# Patient Record
Sex: Male | Born: 1935 | Race: White | Hispanic: No | Marital: Married | Smoking: Former smoker
Health system: Southern US, Community
[De-identification: ages and names within clinical notes are randomized; demographics above are authoritative.]

## PROBLEM LIST (undated history)

## (undated) DIAGNOSIS — I10 Essential (primary) hypertension: Secondary | ICD-10-CM

## (undated) DIAGNOSIS — I480 Paroxysmal atrial fibrillation: Secondary | ICD-10-CM

## (undated) DIAGNOSIS — J449 Chronic obstructive pulmonary disease, unspecified: Secondary | ICD-10-CM

## (undated) DIAGNOSIS — M1990 Unspecified osteoarthritis, unspecified site: Secondary | ICD-10-CM

## (undated) DIAGNOSIS — N400 Enlarged prostate without lower urinary tract symptoms: Secondary | ICD-10-CM

## (undated) HISTORY — DX: Essential (primary) hypertension: I10

## (undated) HISTORY — DX: Unspecified osteoarthritis, unspecified site: M19.90

## (undated) HISTORY — DX: Benign prostatic hyperplasia without lower urinary tract symptoms: N40.0

## (undated) HISTORY — DX: Paroxysmal atrial fibrillation: I48.0

## (undated) HISTORY — DX: Chronic obstructive pulmonary disease, unspecified: J44.9

---

## 1999-06-14 ENCOUNTER — Encounter: Payer: Self-pay | Admitting: Family Medicine

## 1999-06-14 ENCOUNTER — Encounter: Admission: RE | Admit: 1999-06-14 | Discharge: 1999-06-14 | Payer: Self-pay | Admitting: Family Medicine

## 1999-10-21 ENCOUNTER — Encounter: Admission: RE | Admit: 1999-10-21 | Discharge: 1999-10-21 | Payer: Self-pay | Admitting: Family Medicine

## 1999-10-21 ENCOUNTER — Encounter: Payer: Self-pay | Admitting: Family Medicine

## 2006-02-16 ENCOUNTER — Encounter: Admission: RE | Admit: 2006-02-16 | Discharge: 2006-02-16 | Payer: Self-pay | Admitting: Vascular Surgery

## 2010-02-10 HISTORY — PX: CARPAL TUNNEL RELEASE: SHX101

## 2010-03-06 LAB — SURGICAL PCR SCREEN
MRSA, PCR: NEGATIVE
Staphylococcus aureus: NEGATIVE

## 2010-03-06 LAB — CBC
HCT: 37.7 % — ABNORMAL LOW (ref 39.0–52.0)
Hemoglobin: 13.1 g/dL (ref 13.0–17.0)
MCH: 29.8 pg (ref 26.0–34.0)
MCHC: 34.7 g/dL (ref 30.0–36.0)
MCV: 85.9 fL (ref 78.0–100.0)
Platelets: 247 10*3/uL (ref 150–400)
RBC: 4.39 MIL/uL (ref 4.22–5.81)
RDW: 12.1 % (ref 11.5–15.5)
WBC: 7.4 10*3/uL (ref 4.0–10.5)

## 2010-03-06 LAB — BASIC METABOLIC PANEL
BUN: 14 mg/dL (ref 6–23)
CO2: 28 mEq/L (ref 19–32)
Calcium: 9.3 mg/dL (ref 8.4–10.5)
Chloride: 104 mEq/L (ref 96–112)
Creatinine, Ser: 1.65 mg/dL — ABNORMAL HIGH (ref 0.4–1.5)
GFR calc Af Amer: 49 mL/min — ABNORMAL LOW (ref >60)
GFR calc non Af Amer: 40 mL/min — ABNORMAL LOW (ref >60)
Glucose, Bld: 115 mg/dL — ABNORMAL HIGH (ref 70–99)
Potassium: 3.8 mEq/L (ref 3.5–5.1)
Sodium: 140 mEq/L (ref 135–145)

## 2010-03-08 ENCOUNTER — Ambulatory Visit (HOSPITAL_COMMUNITY)
Admission: RE | Admit: 2010-03-08 | Discharge: 2010-03-08 | Payer: Self-pay | Source: Home / Self Care | Attending: Neurosurgery | Admitting: Neurosurgery

## 2010-04-05 NOTE — Op Note (Signed)
  NAME:  Vincent Barrett, Vincent Barrett NO.:  [id number]  MEDICAL RECORD NO.:  [id number]          PATIENT TYPE:  OIB  LOCATION:  3536                         FACILITY:  [facility]  PHYSICIAN:  Coletta Memos, M.D.     DATE OF BIRTH:  [birth year]  DATE OF PROCEDURE:  03/08/2010 DATE OF DISCHARGE:  03/08/2010                              OPERATIVE REPORT   PREOPERATIVE DIAGNOSIS:  Left ulnar neuropathy.  POSTOPERATIVE DIAGNOSIS:  Left ulnar neuropathy.  PROCEDURE:  Left ulnar nerve decompression, cubital tunnel.  COMPLICATIONS:  None.  ANESTHESIA:  General endotracheal.  SURGEON:  Coletta Memos, MD  INDICATIONS:  Mr. Brensinger is an 77 year old with 74-month history of a fairly rapid weakness in his left hand.  EMG nerve conduction studies revealed left ulnar neuropathy.  I offered and recommended and he agreed to undergo operative decompression.  OPERATIVE NOTE:  Mr. Wesche was brought to the operating room, intubated, and placed under general anesthetic without difficulty.  His left upper extremity was prepped in a sterile fashion.  I made a semicircle incision centering over the medial epicondyle on the left elbow.  I then developed a flap, brought that down, and was able to identify the cubital tunnel.  I then dissected using both sharp and blunt technique until I exposed the ulnar nerve.  I was able to stimulate it actually quite well at the elbow and distal, but I was unable to get much stimulation proximal to it.  I took great care to make sure that there was no compression, obvious root injury, or any other type of problem proximally, and I was not able to identify one.  I then satisfied myself with the decompression, had been split flexor carpi ulnaris in its most proximal aspect to ensure decompression distally.  I irrigated the wound.  I closed the wound in layered fashion using Vicryl sutures to reapproximate the incision.  I used Dermabond for sterile dressing.      ______________________________ Coletta Memos, M.D.     KC/MEDQ  D:  03/08/2010  T:  03/09/2010  Job:  [id number]  Electronically Signed by Coletta Memos M.D. on 04/05/2010 09:23:49 AM

## 2010-04-05 NOTE — Discharge Summary (Signed)
  NAMEJERIMYAH, Vincent Barrett NO.:  [id number]  MEDICAL RECORD NO.:  [id number]          PATIENT TYPE:  OIB  LOCATION:  3536                         FACILITY:  [facility]  PHYSICIAN:  Coletta Memos, M.D.     DATE OF BIRTH:  April 03, 1925  DATE OF ADMISSION:  03/08/2010 DATE OF DISCHARGE:  03/08/2010                              DISCHARGE SUMMARY   ADMITTING DIAGNOSIS:  Left ulnar neuropathy.  POSTOPERATIVE DISCHARGE DIAGNOSIS:  Left ulnar neuropathy.  PROCEDURE:  Left ulnar nerve decompression at the cubital tunnel.  COMPLICATIONS:  None.  DISCHARGE STATUS:  Alive and well.  MEDICATIONS:  Tylenol #3.  INDICATIONS:  Ms. Ziemann is an 77 year old who has had significant weakness in his left hand over approximately last 2 months.  EMG showed significant ulnar nerve compromise.  He had cervical spondylosis, but that was not the cause.  I therefore recommended, he agreed to undergo operative decompression.  He was admitted and then discharged from the same-day operation of the ulnar nerve.  The wound was clean, dry, and no signs of infection at discharge.  His hand was working well at discharge.  I will see him in the office in 2-3 weeks for followup.          ______________________________ Coletta Memos, M.D.     KC/MEDQ  D:  03/08/2010  T:  03/09/2010  Job:  [id number]  Electronically Signed by Coletta Memos M.D. on 04/05/2010 09:23:45 AM

## 2011-02-11 HISTORY — PX: TRANSTHORACIC ECHOCARDIOGRAM: SHX275

## 2011-08-08 HISTORY — PX: NM MYOCAR PERF WALL MOTION: HXRAD629

## 2012-03-17 ENCOUNTER — Other Ambulatory Visit (HOSPITAL_COMMUNITY): Payer: Self-pay | Admitting: Cardiovascular Disease

## 2012-03-17 DIAGNOSIS — I729 Aneurysm of unspecified site: Secondary | ICD-10-CM

## 2012-03-31 ENCOUNTER — Ambulatory Visit (HOSPITAL_COMMUNITY): Payer: Self-pay

## 2012-04-07 ENCOUNTER — Ambulatory Visit (HOSPITAL_COMMUNITY)
Admission: RE | Admit: 2012-04-07 | Discharge: 2012-04-07 | Disposition: A | Discharge status: Home / Self Care | Payer: Medicare Other | Source: Ambulatory Visit | Attending: Cardiovascular Disease | Admitting: Cardiovascular Disease

## 2012-04-07 DIAGNOSIS — I729 Aneurysm of unspecified site: Secondary | ICD-10-CM

## 2012-04-07 NOTE — Progress Notes (Signed)
Aorta Duplex Completed. Brysten Reister D  

## 2012-09-13 ENCOUNTER — Other Ambulatory Visit: Payer: Self-pay | Admitting: Cardiovascular Disease

## 2012-09-13 ENCOUNTER — Other Ambulatory Visit: Payer: Self-pay | Admitting: *Deleted

## 2012-09-13 DIAGNOSIS — I10 Essential (primary) hypertension: Secondary | ICD-10-CM

## 2012-09-13 LAB — CBC WITH DIFFERENTIAL/PLATELET
Basophils Absolute: 0.1 10*3/uL (ref 0.0–0.1)
Eosinophils Relative: 5 % (ref 0–5)
Lymphocytes Relative: 35 % (ref 12–46)
MCV: 86.5 fL (ref 78.0–100.0)
Neutro Abs: 3.5 10*3/uL (ref 1.7–7.7)
Neutrophils Relative %: 48 % (ref 43–77)
Platelets: 238 10*3/uL (ref 150–400)
RBC: 4.38 MIL/uL (ref 4.22–5.81)
RDW: 13.3 % (ref 11.5–15.5)
WBC: 7.1 10*3/uL (ref 4.0–10.5)

## 2012-09-13 LAB — MAGNESIUM: Magnesium: 1.9 mg/dL (ref 1.5–2.5)

## 2012-09-13 MED ORDER — AMLODIPINE BESYLATE 2.5 MG PO TABS: 2.5000 mg | ORAL_TABLET | Freq: Every day | ORAL | Status: DC

## 2012-12-13 ENCOUNTER — Telehealth: Payer: Self-pay | Admitting: Cardiovascular Disease

## 2012-12-13 NOTE — Telephone Encounter (Signed)
Now taking Bystolic 5 mg tablets  After first of year insurance will not cover.  They suggested Atenolol orl metoprolol .  Please call so she can get straight before Jan 1.  Also wants to know when his next appt would be.

## 2012-12-13 NOTE — Telephone Encounter (Signed)
Returned call and pt verified x 2 w/ Alona Bene, pt's wife.  Stated Medicare sent letter that they will not cover his Bystolic at the beginning of the year and want him to be switched to another med.  Informed pt will need to see new cardiologist to make the change.  Wife did not want to schedule appt now.  Stated refill pt gets this week will last through January.  Informed scheduling will be notified to enter a recall for pt appt in January w/ new cardiologist.  Wife verbalized understanding and agreed w/ plan.  Will call back by Dec. 12th if no call from scheduler.  Scheduling notified and recall entered per [facility].

## 2013-02-02 ENCOUNTER — Encounter: Payer: Self-pay | Admitting: *Deleted

## 2013-02-07 ENCOUNTER — Encounter: Payer: Self-pay | Admitting: Internal Medicine

## 2013-02-15 ENCOUNTER — Encounter: Payer: Self-pay | Admitting: Internal Medicine

## 2013-02-15 ENCOUNTER — Ambulatory Visit (INDEPENDENT_AMBULATORY_CARE_PROVIDER_SITE_OTHER): Payer: Medicare Other | Admitting: Internal Medicine

## 2013-02-15 VITALS — BP 176/70 | HR 61 | Ht 65.0 in | Wt 170.5 lb

## 2013-02-15 DIAGNOSIS — I10 Essential (primary) hypertension: Secondary | ICD-10-CM

## 2013-02-15 DIAGNOSIS — I480 Paroxysmal atrial fibrillation: Secondary | ICD-10-CM

## 2013-02-15 DIAGNOSIS — I4891 Unspecified atrial fibrillation: Secondary | ICD-10-CM

## 2013-02-15 DIAGNOSIS — J449 Chronic obstructive pulmonary disease, unspecified: Secondary | ICD-10-CM

## 2013-02-15 MED ORDER — METOPROLOL SUCCINATE ER 25 MG PO TB24: 25.0000 mg | ORAL_TABLET | Freq: Every day | ORAL | Status: DC

## 2013-02-15 NOTE — Patient Instructions (Signed)
Your physician wants you to follow-up in: 1 year with Dr. Rennis GoldenHilty.  You will receive a reminder letter in the mail two months in advance. If you don't receive a letter, please call our office to schedule the follow-up appointment.  STOP Bystolic START Toprol XL 25mg  once daily.

## 2013-02-17 ENCOUNTER — Encounter: Payer: Self-pay | Admitting: Internal Medicine

## 2013-02-17 DIAGNOSIS — I4891 Unspecified atrial fibrillation: Secondary | ICD-10-CM | POA: Insufficient documentation

## 2013-02-17 DIAGNOSIS — I10 Essential (primary) hypertension: Secondary | ICD-10-CM | POA: Insufficient documentation

## 2013-02-17 DIAGNOSIS — J449 Chronic obstructive pulmonary disease, unspecified: Secondary | ICD-10-CM | POA: Insufficient documentation

## 2013-02-17 NOTE — Progress Notes (Signed)
OFFICE NOTE  Chief Complaint:  Establish new cardiologist  Primary Care Physician: Pamelia Hoit, MD  HPI:  Vincent Barrett is a pleasant 80 year old male previously followed by Dr. Alanda Amass who is here today to establish cardiac care with me. His past medical history is significant for an episode of paroxysmal atrial fibrillation in June of 2013. He converted spontaneously back to sinus rhythm and seems to maintain that. Based on the relative infrequency of his episodes, he has been maintained on aspirin. He has as history of hypertension which is fairly well-controlled although he does have an element of whitecoat hypertension. He was a former smoker but quit over 50 years ago. He had a negative Myoview for ischemia in June of 2013.  He denies any chest pain or worsening shortness of breath.  PMHx:  Past Medical History  Diagnosis Date  . PAF (paroxysmal atrial fibrillation)   . Hypertension   . COPD (chronic obstructive pulmonary disease)   . BPH (benign prostatic hyperplasia)   . OA (osteoarthritis)     Past Surgical History  Procedure Laterality Date  . Carpal tunnel release  02/2010    Dr. Franky Macho  . Transthoracic echocardiogram  2013    EF 52%, mild AV leaflet thickening; trace TR  . Nm myocar perf wall motion  08/08/2011    lexiscan; normal pattern of perfusion in all regions; post-stress EF 69%; low risk scan     FAMHx:  No family history on file.  SOCHx:   reports that he quit smoking about 50 years ago. He has never used smokeless tobacco. He reports that he does not drink alcohol or use illicit drugs.  ALLERGIES:  No Known Allergies  ROS: A comprehensive review of systems was negative.  HOME MEDS: Current Outpatient Prescriptions  Medication Sig Dispense Refill  . amLODipine (NORVASC) 2.5 MG tablet Take 1 tablet (2.5 mg total) by mouth daily.      Marland Kitchen aspirin 325 MG tablet Take 325 mg by mouth every other day.       . doxazosin (CARDURA) 4 MG tablet Take  4 mg by mouth daily.      . GuaiFENesin (MUCINEX PO) Take by mouth as needed.      Marland Kitchen losartan-hydrochlorothiazide (HYZAAR) 100-25 MG per tablet Take 1 tablet by mouth daily.      . Omega-3 Fatty Acids (FISH OIL) 1000 MG CAPS Take 1 capsule by mouth daily.      . pantoprazole (PROTONIX) 40 MG tablet Take 40 mg by mouth daily.      . vitamin B-12 (CYANOCOBALAMIN) 1000 MCG tablet Take 1,000 mcg by mouth daily.      . vitamin C (ASCORBIC ACID) 500 MG tablet Take 1,000 mg by mouth daily.       . vitamin E 400 UNIT capsule Take 400 Units by mouth daily.      . metoprolol succinate (TOPROL XL) 25 MG 24 hr tablet Take 1 tablet (25 mg total) by mouth daily.  90 tablet  3   No current facility-administered medications for this visit.    LABS/IMAGING: No results found for this or any previous visit (from the past 48 hour(s)). No results found.  VITALS: BP 176/70  Pulse 61  Ht 5\' 5"  (1.651 m)  Wt 170 lb 8 oz (77.338 kg)  BMI 28.37 kg/m2  EXAM: General appearance: alert and no distress Neck: no carotid bruit and no JVD Lungs: clear to auscultation bilaterally Heart: regular rate and rhythm, S1, S2 normal,  no murmur, click, rub or gallop Abdomen: soft, non-tender; bowel sounds normal; no masses,  no organomegaly Extremities: extremities normal, atraumatic, no cyanosis or edema Pulses: 2+ and symmetric Skin: Skin color, texture, turgor normal. No rashes or lesions Neurologic: Grossly normal Psych: Mood, affect normal  EKG: Sinus rhythm with first degree AV block at 61, PR interval 294 ms  ASSESSMENT: 1. Hypertension-controlled 2. One episode of possible atrial fibrillation, without recurrence on aspirin  PLAN: 1.   Vincent Barrett is doing well without recurrence of palpitations or other tachyarrhythmias. I did note an EKG from 06/25/2011 which was interpreted as atrial fibrillation, however it does appear that this may be sinus with frequent PACs.  Nevertheless there has been no recurrence  and I will recommend continuing on aspirin unless there is more evidence for true atrial fibrillation. His blood pressure is well controlled. Plan to see him back in one year.  Chrystie NoseKenneth C. Hilty, MD, [facility] Center[facility] Attending Cardiologist [facility]  HILTY,Kenneth C 02/17/2013, 1:21 PM

## 2014-02-07 ENCOUNTER — Other Ambulatory Visit: Payer: Self-pay | Admitting: Internal Medicine

## 2014-02-08 NOTE — Telephone Encounter (Signed)
Rx(s) sent to pharmacy electronically. OV 02/15/14

## 2014-02-15 ENCOUNTER — Ambulatory Visit (INDEPENDENT_AMBULATORY_CARE_PROVIDER_SITE_OTHER): Payer: Medicare Other | Admitting: Internal Medicine

## 2014-02-15 ENCOUNTER — Encounter: Payer: Self-pay | Admitting: Internal Medicine

## 2014-02-15 VITALS — BP 142/68 | HR 74 | Ht 65.0 in | Wt 167.1 lb

## 2014-02-15 DIAGNOSIS — I480 Paroxysmal atrial fibrillation: Secondary | ICD-10-CM

## 2014-02-15 DIAGNOSIS — I10 Essential (primary) hypertension: Secondary | ICD-10-CM

## 2014-02-15 DIAGNOSIS — J438 Other emphysema: Secondary | ICD-10-CM

## 2014-02-15 MED ORDER — METOPROLOL SUCCINATE ER 25 MG PO TB24: 25.0000 mg | ORAL_TABLET | Freq: Every day | ORAL | Status: DC

## 2014-02-15 NOTE — Patient Instructions (Signed)
Your physician has recommended you make the following change in your medication: TAKE ASPIRIN 81mg  EVERY DAY  Your physician wants you to follow-up in: 1 year with Dr. Rennis GoldenHilty. You will receive a reminder letter in the mail two months in advance. If you don't receive a letter, please call our office to schedule the follow-up appointment.

## 2014-02-15 NOTE — Progress Notes (Signed)
OFFICE NOTE  Chief Complaint:  Routine follow-up  Primary Care Physician: Pamelia HoitWILSON,FRED Theoplis, MD  HPI:  Rodman KeyHenry Shadwick is a pleasant 81 year old male previously followed by Dr. Alanda AmassWeintraub who is here today to establish cardiac care with me. His past medical history is significant for an episode of paroxysmal atrial fibrillation in June of 2013. He converted spontaneously back to sinus rhythm and seems to maintain that. Based on the relative infrequency of his episodes, he has been maintained on aspirin. He has as history of hypertension which is fairly well-controlled although he does have an element of whitecoat hypertension. He was a former smoker but quit over 50 years ago. He had a negative Myoview for ischemia in June of 2013.  He denies any chest pain or worsening shortness of breath.  [name] returns today for follow-up. He denies any chest pain or worsening shortness of breath. He continues to have lower leg swelling. This may be related to the Norvasc although recent decreases in his Norvasc has not improved the swelling. He is on low-dose Hyzaar. He reports no recurrent atrial fibrillation. Is not clear whether he actually had A. fib in the past or perhaps very frequent PACs. Either way the burden of his A. fib is very low and therefore aspirin therapy is reasonable.  PMHx:  Past Medical History  Diagnosis Date  . PAF (paroxysmal atrial fibrillation)   . Hypertension   . COPD (chronic obstructive pulmonary disease)   . BPH (benign prostatic hyperplasia)   . OA (osteoarthritis)     Past Surgical History  Procedure Laterality Date  . Carpal tunnel release  02/2010    Dr. Franky Machoabbell  . Transthoracic echocardiogram  2013    EF 52%, mild AV leaflet thickening; trace TR  . Nm myocar perf wall motion  08/08/2011    lexiscan; normal pattern of perfusion in all regions; post-stress EF 69%; low risk scan     FAMHx:  History reviewed. No pertinent family history.  SOCHx:   reports  that he quit smoking about 51 years ago. He has never used smokeless tobacco. He reports that he does not drink alcohol or use illicit drugs.  ALLERGIES:  No Known Allergies  ROS: A comprehensive review of systems was negative except for: Cardiovascular: positive for lower extremity edema  HOME MEDS: Current Outpatient Prescriptions  Medication Sig Dispense Refill  . amLODipine (NORVASC) 2.5 MG tablet Take 1 tablet (2.5 mg total) by mouth daily.    Marland Kitchen. aspirin EC 81 MG tablet Take 81 mg by mouth daily.    Marland Kitchen. doxazosin (CARDURA) 4 MG tablet Take 4 mg by mouth daily.    . GuaiFENesin (MUCINEX PO) Take by mouth as needed.    Marland Kitchen. losartan-hydrochlorothiazide (HYZAAR) 100-25 MG per tablet Take 1 tablet by mouth daily.    . metoprolol succinate (TOPROL-XL) 25 MG 24 hr tablet Take 1 tablet (25 mg total) by mouth daily. 90 tablet 3  . Omega-3 Fatty Acids (FISH OIL) 1000 MG CAPS Take 1 capsule by mouth daily.    . vitamin B-12 (CYANOCOBALAMIN) 1000 MCG tablet Take 1,000 mcg by mouth daily.    . vitamin C (ASCORBIC ACID) 500 MG tablet Take 1,000 mg by mouth daily.     . vitamin E 400 UNIT capsule Take 400 Units by mouth daily.     No current facility-administered medications for this visit.    LABS/IMAGING: No results found for this or any previous visit (from the past 48 hour(s)). No  results found.  VITALS: BP 142/68 mmHg  Pulse 74  Ht  (1.651 m)  Wt 167 lb 1.6 oz (75.796 kg)  BMI 27.81 kg/m2  EXAM: General appearance: alert and no distress Neck: no carotid bruit and no JVD Lungs: clear to auscultation bilaterally Heart: regular rate and rhythm, S1, S2 normal, no murmur, click, rub or gallop Abdomen: soft, non-tender; bowel sounds normal; no masses,  no organomegaly Extremities: edema 2+ RLE and 1+ LLE Pulses: 2+ and symmetric Skin: Skin color, texture, turgor normal. No rashes or lesions Neurologic: Grossly normal Psych: Mood, affect normal  EKG: Sinus rhythm with first  degree AV block and PACs  ASSESSMENT: 1. Hypertension-controlled 2. One episode of possible atrial fibrillation, without recurrence on aspirin 3. Leg edema  PLAN: 1.   Mr. Moya is doing well without recurrence of palpitations or other tachyarrhythmias. I did note an EKG from 06/25/2011 which was interpreted as atrial fibrillation, however it does appear that this may be sinus with frequent PACs.  Nevertheless there has been no recurrence and I will recommend continuing on aspirin unless there is more evidence for true atrial fibrillation. He did tell me today that he takes a full dose aspirin very irregularly. I've encouraged him to start taking low-dose aspirin on a daily basis. His blood pressure is well controlled. Of encouraged him to continue to wear compression stockings, elevate his legs as they may help with the edema. Plan to see him back in 6 months.  Chrystie Nose, MD, [facility] Attending Cardiologist [facility]  Jaely Silman C 02/15/2014, 5:32 PM

## 2014-03-29 ENCOUNTER — Encounter: Payer: Self-pay | Admitting: Internal Medicine

## 2014-12-04 ENCOUNTER — Telehealth: Payer: Self-pay | Admitting: Internal Medicine

## 2014-12-05 NOTE — Telephone Encounter (Signed)
Close encounter 

## 2015-02-17 ENCOUNTER — Emergency Department (HOSPITAL_COMMUNITY)
Admission: EM | Admit: 2015-02-17 | Discharge: 2015-02-17 | Disposition: A | Discharge status: Home / Self Care | Payer: Medicare Other | Attending: Emergency Medicine | Admitting: Emergency Medicine

## 2015-02-17 ENCOUNTER — Emergency Department (HOSPITAL_COMMUNITY): Payer: Medicare Other

## 2015-02-17 ENCOUNTER — Encounter (HOSPITAL_COMMUNITY): Payer: Self-pay

## 2015-02-17 DIAGNOSIS — S1081XA Abrasion of other specified part of neck, initial encounter: Secondary | ICD-10-CM

## 2015-02-17 DIAGNOSIS — Y92014 Private driveway to single-family (private) house as the place of occurrence of the external cause: Secondary | ICD-10-CM | POA: Diagnosis not present

## 2015-02-17 DIAGNOSIS — L089 Local infection of the skin and subcutaneous tissue, unspecified: Secondary | ICD-10-CM

## 2015-02-17 DIAGNOSIS — S4992XA Unspecified injury of left shoulder and upper arm, initial encounter: Secondary | ICD-10-CM | POA: Diagnosis not present

## 2015-02-17 DIAGNOSIS — S0990XA Unspecified injury of head, initial encounter: Secondary | ICD-10-CM | POA: Insufficient documentation

## 2015-02-17 DIAGNOSIS — S0081XA Abrasion of other part of head, initial encounter: Secondary | ICD-10-CM | POA: Insufficient documentation

## 2015-02-17 DIAGNOSIS — Z79899 Other long term (current) drug therapy: Secondary | ICD-10-CM | POA: Diagnosis not present

## 2015-02-17 DIAGNOSIS — I10 Essential (primary) hypertension: Secondary | ICD-10-CM | POA: Diagnosis not present

## 2015-02-17 DIAGNOSIS — S2232XA Fracture of one rib, left side, initial encounter for closed fracture: Secondary | ICD-10-CM | POA: Insufficient documentation

## 2015-02-17 DIAGNOSIS — I480 Paroxysmal atrial fibrillation: Secondary | ICD-10-CM | POA: Insufficient documentation

## 2015-02-17 DIAGNOSIS — Z7982 Long term (current) use of aspirin: Secondary | ICD-10-CM | POA: Insufficient documentation

## 2015-02-17 DIAGNOSIS — M1990 Unspecified osteoarthritis, unspecified site: Secondary | ICD-10-CM | POA: Diagnosis not present

## 2015-02-17 DIAGNOSIS — Z87891 Personal history of nicotine dependence: Secondary | ICD-10-CM | POA: Diagnosis not present

## 2015-02-17 DIAGNOSIS — Y9301 Activity, walking, marching and hiking: Secondary | ICD-10-CM | POA: Diagnosis not present

## 2015-02-17 DIAGNOSIS — Z23 Encounter for immunization: Secondary | ICD-10-CM | POA: Diagnosis not present

## 2015-02-17 DIAGNOSIS — J449 Chronic obstructive pulmonary disease, unspecified: Secondary | ICD-10-CM | POA: Insufficient documentation

## 2015-02-17 DIAGNOSIS — Y998 Other external cause status: Secondary | ICD-10-CM | POA: Insufficient documentation

## 2015-02-17 DIAGNOSIS — N400 Enlarged prostate without lower urinary tract symptoms: Secondary | ICD-10-CM | POA: Insufficient documentation

## 2015-02-17 DIAGNOSIS — R0781 Pleurodynia: Secondary | ICD-10-CM | POA: Diagnosis present

## 2015-02-17 DIAGNOSIS — W01198A Fall on same level from slipping, tripping and stumbling with subsequent striking against other object, initial encounter: Secondary | ICD-10-CM | POA: Insufficient documentation

## 2015-02-17 DIAGNOSIS — W19XXXA Unspecified fall, initial encounter: Secondary | ICD-10-CM

## 2015-02-17 MED ORDER — TRAMADOL HCL 50 MG PO TABS: 50.0000 mg | ORAL_TABLET | Freq: Four times a day (QID) | ORAL | Status: DC | PRN

## 2015-02-17 MED ORDER — TETANUS-DIPHTH-ACELL PERTUSSIS 5-2.5-18.5 LF-MCG/0.5 IM SUSP
0.5000 mL | Freq: Once | INTRAMUSCULAR | Status: AC
  Administered 2015-02-17: 0.5 mL via INTRAMUSCULAR
  Filled 2015-02-17: qty 0.5

## 2015-02-17 MED ORDER — TRAMADOL HCL 50 MG PO TABS
50.0000 mg | ORAL_TABLET | Freq: Once | ORAL | Status: AC
Start: 2015-02-17 — End: 2015-02-17
  Administered 2015-02-17: 50 mg via ORAL
  Filled 2015-02-17: qty 1

## 2015-02-17 NOTE — ED Notes (Signed)
Having pain in my whole left side from slipping and falling on ice in the driveway.

## 2015-02-17 NOTE — ED Provider Notes (Signed)
CSN: [id number]     Arrival date & time 02/17/15  1957 History   First MD Initiated Contact with Patient 02/17/15 2010     Chief Complaint  Patient presents with  . Fall     (Consider location/radiation/quality/duration/timing/severity/associated sxs/prior Treatment) HPI   Vincent Barrett is a 82 y.o. male who presents for evaluation of slip and fall. He was walking to the mailbox after cleaning the driveway when he suddenly slipped and fell on his heels, able to get up and walk afterwards. He complains of pain to his head and left shoulder, and left ribs. There was no loss of consciousness. He denies neck or back pain. He denies preceding symptoms. He saw his cardiologist for a checkup, yesterday. He is taking his usual medications, without relief. There are no other known modifying factors.  Past Medical History  Diagnosis Date  . PAF (paroxysmal atrial fibrillation) (HCC)   . Hypertension   . COPD (chronic obstructive pulmonary disease) (HCC)   . BPH (benign prostatic hyperplasia)   . OA (osteoarthritis)    Past Surgical History  Procedure Laterality Date  . Carpal tunnel release  02/2010    Dr. Franky Macho  . Transthoracic echocardiogram  2013    EF 52%, mild AV leaflet thickening; trace TR  . Nm myocar perf wall motion  08/08/2011    lexiscan; normal pattern of perfusion in all regions; post-stress EF 69%; low risk scan    No family history on file. Social History  Substance Use Topics  . Smoking status: Former Smoker    Quit date: 02/03/1963  . Smokeless tobacco: Never Used  . Alcohol Use: No    Review of Systems  All other systems reviewed and are negative.     Allergies  Review of patient's allergies indicates no known allergies.  Home Medications   Prior to Admission medications   Medication Sig Start Date End Date Taking? Authorizing Provider  amLODipine (NORVASC) 2.5 MG tablet Take 1 tablet (2.5 mg total) by mouth daily. 09/13/12  Yes Susa Griffins, MD  aspirin  EC 325 MG tablet Take 325 mg by mouth once as needed for moderate pain.   Yes Historical Provider, MD  aspirin EC 81 MG tablet Take 81 mg by mouth daily.   Yes Historical Provider, MD  dextromethorphan-guaiFENesin (MUCINEX DM) 30-600 MG 12hr tablet Take 1 tablet by mouth 2 (two) times daily as needed for cough.   Yes Historical Provider, MD  doxazosin (CARDURA) 4 MG tablet Take 4 mg by mouth daily.   Yes Historical Provider, MD  losartan-hydrochlorothiazide (HYZAAR) 100-25 MG per tablet Take 1 tablet by mouth daily.   Yes Historical Provider, MD  metoprolol succinate (TOPROL-XL) 25 MG 24 hr tablet Take 1 tablet (25 mg total) by mouth daily. 02/15/14  Yes Chrystie Nose, MD  Omega-3 Fatty Acids (FISH OIL) 1000 MG CAPS Take 1 capsule by mouth daily.   Yes Historical Provider, MD  vitamin B-12 (CYANOCOBALAMIN) 1000 MCG tablet Take 1,000 mcg by mouth daily.   Yes Historical Provider, MD  vitamin C (ASCORBIC ACID) 500 MG tablet Take 1,000 mg by mouth daily.    Yes Historical Provider, MD  vitamin E 400 UNIT capsule Take 400 Units by mouth daily.   Yes Historical Provider, MD  traMADol (ULTRAM) 50 MG tablet Take 1 tablet (50 mg total) by mouth every 6 (six) hours as needed. 02/17/15   Mancel Bale, MD   BP 151/82 mmHg  Pulse 115  Temp(Src) 98.1 F (36.7  C) (Oral)  Resp 18  Ht 5\' 6"  (1.676 m)  Wt 167 lb (75.751 kg)  BMI 26.97 kg/m2  SpO2 96% Physical Exam  Constitutional: He is oriented to person, place, and time. He appears well-developed and well-nourished.  HENT:  Head: Normocephalic.  Right Ear: External ear normal.  Left Ear: External ear normal.  Small abrasion and contusion left forehead. No active bleeding. No crepitation of the scalp.  Eyes: Conjunctivae and EOM are normal. Pupils are equal, round, and reactive to light.  Neck: Normal range of motion and phonation normal. Neck supple.  Cardiovascular: Normal rate, regular rhythm and normal heart sounds.   Pulmonary/Chest: Effort normal  and breath sounds normal. He exhibits tenderness (left chest, diffusely, no flail segment, or crepitation). He exhibits no bony tenderness.  Abdominal: Soft. There is no tenderness.  Musculoskeletal: Normal range of motion.  No deformity of the shoulders, elbows or wrists. I am able to passively move the left shoulder, without significant pain in the left shoulder.  Neurological: He is alert and oriented to person, place, and time. No cranial nerve deficit or sensory deficit. He exhibits normal muscle tone. Coordination normal.  Skin: Skin is warm, dry and intact.  Psychiatric: He has a normal mood and affect. His behavior is normal. Judgment and thought content normal.  Nursing note and vitals reviewed.   ED Course  Procedures (including critical care time)  Medications  Tdap (BOOSTRIX) injection 0.5 mL (0.5 mLs Intramuscular Given 02/17/15 2025)  traMADol (ULTRAM) tablet 50 mg (50 mg Oral Given 02/17/15 2020)    Patient Vitals for the past 24 hrs:  BP Temp Temp src Pulse Resp SpO2 Height Weight  02/17/15 2002 151/82 mmHg 98.1 F (36.7 C) Oral 115 18 96 % 5\' 6"  (1.676 m) 167 lb (75.751 kg)    9:20 PM Reevaluation with update and discussion. After initial assessment and treatment, an updated evaluation reveals no further complaints. Findings discussed with patient and family member, all questions were answered. Tarynn Garling L    Labs Review Labs Reviewed - No data to display  Imaging Review Dg Ribs Unilateral W/chest Left  02/17/2015  CLINICAL DATA:  Left-sided rib pain status post fall. EXAM: LEFT RIBS AND CHEST - 3+ VIEW COMPARISON:  None. FINDINGS: There is a minimally displaced fracture of the posterior left eighth rib. No other displaced rib fractures are seen. There is no evidence of pneumothorax. Calcified atherosclerotic disease of the aorta is noted. Cardiac silhouette is mildly enlarged. There is no evidence of airspace consolidation or pleural effusion. IMPRESSION: Minimally  displaced fracture of the posterior left eighth rib. Electronically Signed   By: Ted Mcalpine M.D.   On: 02/17/2015 20:53   Ct Head Wo Contrast  02/17/2015  CLINICAL DATA:  Slipped and fell on ice in driveway. LEFT headache, LEFT neck pain. History of hypertension. EXAM: CT HEAD WITHOUT CONTRAST CT CERVICAL SPINE WITHOUT CONTRAST TECHNIQUE: Multidetector CT imaging of the head and cervical spine was performed following the standard protocol without intravenous contrast. Multiplanar CT image reconstructions of the cervical spine were also generated. COMPARISON:  MRI of the cervical spine November 28, 2009 FINDINGS: CT HEAD FINDINGS Moderate to severe ventriculomegaly on the basis of global parenchymal brain volume loss. No intraparenchymal hemorrhage, mass effect nor midline shift. Patchy supratentorial white matter hypodensities are within normal range for patient's age and though non-specific suggest sequelae of chronic small vessel ischemic disease. No acute large vascular territory infarcts. No abnormal extra-axial fluid collections. Basal cisterns are  patent. Moderate calcific atherosclerosis of the carotid siphons. No skull fracture. The included ocular globes and orbital contents are non-suspicious. Status post bilateral ocular lens implants. Mild maxillary sinus mucosal thickening without air-fluid levels. Mastoid air cells are well aerated. Patient is edentulous. CT CERVICAL SPINE FINDINGS Cervical vertebral bodies and posterior elements are intact and aligned with straightened cervical lordosis. C1-2 articulation maintained with moderate arthropathy. Moderate C3-4 thru C6-7 disc height loss, mild at C7-T1 with uncovertebral hypertrophy. Mild upper cervical facet arthropathy. No destructive bony lesions. Moderate calcific atherosclerosis of the carotid bulbs. Fluid distended esophagus partially imaged. Nuchal ligament calcifications. Moderate to severe RIGHT C3-4 and bilateral C4-5 neural foraminal  narrowing. Severe C5-6 and C6-7 neural foraminal narrowing. IMPRESSION: CT HEAD: No acute intracranial process. Moderate to severe global brain atrophy. Moderate chronic small vessel ischemic disease. CT CERVICAL SPINE: Straightened cervical lordosis without acute fracture or malalignment. Multilevel neural foraminal narrowing: Severe at C5-6 and C6-7. Partially imaged fluid distended esophagus, aspiration risk. Electronically Signed   By: Awilda Metroourtnay  Bloomer M.D.   On: 02/17/2015 21:10   Ct Cervical Spine Wo Contrast  02/17/2015  CLINICAL DATA:  Slipped and fell on ice in driveway. LEFT headache, LEFT neck pain. History of hypertension. EXAM: CT HEAD WITHOUT CONTRAST CT CERVICAL SPINE WITHOUT CONTRAST TECHNIQUE: Multidetector CT imaging of the head and cervical spine was performed following the standard protocol without intravenous contrast. Multiplanar CT image reconstructions of the cervical spine were also generated. COMPARISON:  MRI of the cervical spine November 28, 2009 FINDINGS: CT HEAD FINDINGS Moderate to severe ventriculomegaly on the basis of global parenchymal brain volume loss. No intraparenchymal hemorrhage, mass effect nor midline shift. Patchy supratentorial white matter hypodensities are within normal range for patient's age and though non-specific suggest sequelae of chronic small vessel ischemic disease. No acute large vascular territory infarcts. No abnormal extra-axial fluid collections. Basal cisterns are patent. Moderate calcific atherosclerosis of the carotid siphons. No skull fracture. The included ocular globes and orbital contents are non-suspicious. Status post bilateral ocular lens implants. Mild maxillary sinus mucosal thickening without air-fluid levels. Mastoid air cells are well aerated. Patient is edentulous. CT CERVICAL SPINE FINDINGS Cervical vertebral bodies and posterior elements are intact and aligned with straightened cervical lordosis. C1-2 articulation maintained with  moderate arthropathy. Moderate C3-4 thru C6-7 disc height loss, mild at C7-T1 with uncovertebral hypertrophy. Mild upper cervical facet arthropathy. No destructive bony lesions. Moderate calcific atherosclerosis of the carotid bulbs. Fluid distended esophagus partially imaged. Nuchal ligament calcifications. Moderate to severe RIGHT C3-4 and bilateral C4-5 neural foraminal narrowing. Severe C5-6 and C6-7 neural foraminal narrowing. IMPRESSION: CT HEAD: No acute intracranial process. Moderate to severe global brain atrophy. Moderate chronic small vessel ischemic disease. CT CERVICAL SPINE: Straightened cervical lordosis without acute fracture or malalignment. Multilevel neural foraminal narrowing: Severe at C5-6 and C6-7. Partially imaged fluid distended esophagus, aspiration risk. Electronically Signed   By: Awilda Metroourtnay  Bloomer M.D.   On: 02/17/2015 21:10   I have personally reviewed and evaluated these images and lab results as part of my medical decision-making.   EKG Interpretation None      MDM   Final diagnoses:  Fall, initial encounter  Rib fracture, left, closed, initial encounter  Abrasion, face with infection, initial encounter  Head injury, initial encounter    Mechanical fall, without serious injury. Doubt serious head injury, occult fracture, spinal myelopathy, or significant lung injury.  Nursing Notes Reviewed/ Care Coordinated Applicable Imaging Reviewed Interpretation of Laboratory Data incorporated into ED treatment  The patient appears reasonably screened and/or stabilized for discharge and I doubt any other medical condition or other [facility] requiring further screening, evaluation, or treatment in the ED at this time prior to discharge.  Plan: Home Medications- Tramadol; Home Treatments- rest; return here if the recommended treatment, does not improve the symptoms; Recommended follow up- PCP prn     Mancel Bale, MD 02/17/15 2122

## 2015-02-17 NOTE — Discharge Instructions (Signed)
Abrasion An abrasion is a cut or scrape on the surface of your skin. An abrasion does not go through all of the layers of your skin. It is important to take good care of your abrasion to prevent infection. HOME CARE Medicines  Take or apply medicines only as told by your doctor.  If you were prescribed an antibiotic ointment, finish all of it even if you start to feel better. Wound Care  Clean the wound with mild soap and water 2-3 times per day or as told by your doctor. Pat your wound dry with a clean towel. Do not rub it.  There are many ways to close and cover a wound. Follow instructions from your doctor about:  How to take care of your wound.  When and how you should change your bandage (dressing).  When and how you should take off your dressing.  Check your wound every day for signs of infection. Watch for:  Redness, swelling, or pain.  Fluid, blood, or pus. General Instructions  Keep the dressing dry as told by your doctor. Do not take baths, swim, use a hot tub, or do anything that would put your wound underwater until your doctor says it is okay.  If there is swelling, raise (elevate) the injured area above the level of your heart while you are sitting or lying down.  Keep all follow-up visits as told by your doctor. This is important. GET HELP IF:  You were given a tetanus shot and you have any of these where the needle went in:  Swelling.  Very bad pain.  Redness.  Bleeding.  Medicine does not help your pain.  You have any of these at the site of the wound:  More redness.  More swelling.  More pain. GET HELP RIGHT AWAY IF:  You have a red streak going away from your wound.  You have a fever.  You have fluid, blood, or pus coming from your wound.  There is a bad smell coming from your wound.   This information is not intended to replace advice given to you by your health care provider. Make sure you discuss any questions you have with your  health care provider.   Document Released: 07/16/2007 Document Revised: 06/13/2014 Document Reviewed: 01/25/2014 Elsevier Interactive Patient Education 2016 [employer]  Rib Fracture A rib fracture is a break or crack in one of the bones of the ribs. The ribs are a group of long, curved bones that wrap around your chest and attach to your spine. They protect your lungs and other organs in the chest cavity. A broken or cracked rib is often painful, but most do not cause other problems. Most rib fractures heal on their own over time. However, rib fractures can be more serious if multiple ribs are broken or if broken ribs move out of place and push against other structures. CAUSES   A direct blow to the chest. For example, this could happen during contact sports, a car accident, or a fall against a hard object.  Repetitive movements with high force, such as pitching a baseball or having severe coughing spells. SYMPTOMS   Pain when you breathe in or cough.  Pain when someone presses on the injured area. DIAGNOSIS  Your caregiver will perform a physical exam. Various imaging tests may be ordered to confirm the diagnosis and to look for related injuries. These tests may include a chest X-ray, computed tomography (CT), magnetic resonance imaging (MRI), or a bone scan.  TREATMENT  Rib fractures usually heal on their own in 1-3 months. The longer healing period is often associated with a continued cough or other aggravating activities. During the healing period, pain control is very important. Medication is usually given to control pain. Hospitalization or surgery may be needed for more severe injuries, such as those in which multiple ribs are broken or the ribs have moved out of place.  HOME CARE INSTRUCTIONS   Avoid strenuous activity and any activities or movements that cause pain. Be careful during activities and avoid bumping the injured rib.  Gradually increase activity as directed by your  caregiver.  Only take over-the-counter or prescription medications as directed by your caregiver. Do not take other medications without asking your caregiver first.  Apply ice to the injured area for the first 1-2 days after you have been treated or as directed by your caregiver. Applying ice helps to reduce inflammation and pain.  Put ice in a plastic bag.  Place a towel between your skin and the bag.   Leave the ice on for 15-20 minutes at a time, every 2 hours while you are awake.  Perform deep breathing as directed by your caregiver. This will help prevent pneumonia, which is a common complication of a broken rib. Your caregiver may instruct you to:  Take deep breaths several times a day.  Try to cough several times a day, holding a pillow against the injured area.  Use a device called an incentive spirometer to practice deep breathing several times a day.  Drink enough fluids to keep your urine clear or pale yellow. This will help you avoid constipation.   Do not wear a rib belt or binder. These restrict breathing, which can lead to pneumonia.  SEEK IMMEDIATE MEDICAL CARE IF:   You have a fever.   You have difficulty breathing or shortness of breath.   You develop a continual cough, or you cough up thick or bloody sputum.  You feel sick to your stomach (nausea), throw up (vomit), or have abdominal pain.   You have worsening pain not controlled with medications.  MAKE SURE YOU:  Understand these instructions.  Will watch your condition.  Will get help right away if you are not doing well or get worse.   This information is not intended to replace advice given to you by your health care provider. Make sure you discuss any questions you have with your health care provider.   Document Released: 01/27/2005 Document Revised: 09/29/2012 Document Reviewed: 03/31/2012 Elsevier Interactive Patient Education [employer]

## 2015-02-19 ENCOUNTER — Ambulatory Visit (INDEPENDENT_AMBULATORY_CARE_PROVIDER_SITE_OTHER): Payer: Medicare Other | Admitting: Internal Medicine

## 2015-02-19 ENCOUNTER — Encounter: Payer: Self-pay | Admitting: Internal Medicine

## 2015-02-19 VITALS — BP 142/70 | HR 84 | Ht 65.5 in | Wt 169.2 lb

## 2015-02-19 DIAGNOSIS — I480 Paroxysmal atrial fibrillation: Secondary | ICD-10-CM | POA: Diagnosis not present

## 2015-02-19 DIAGNOSIS — J438 Other emphysema: Secondary | ICD-10-CM | POA: Diagnosis not present

## 2015-02-19 DIAGNOSIS — K5909 Other constipation: Secondary | ICD-10-CM

## 2015-02-19 DIAGNOSIS — I10 Essential (primary) hypertension: Secondary | ICD-10-CM | POA: Diagnosis not present

## 2015-02-19 DIAGNOSIS — K5900 Constipation, unspecified: Secondary | ICD-10-CM | POA: Insufficient documentation

## 2015-02-19 MED ORDER — AMLODIPINE BESYLATE 2.5 MG PO TABS: 2.5000 mg | ORAL_TABLET | Freq: Every day | ORAL | Status: DC

## 2015-02-19 MED ORDER — METOPROLOL SUCCINATE ER 25 MG PO TB24: 25.0000 mg | ORAL_TABLET | Freq: Every day | ORAL | Status: DC

## 2015-02-19 MED ORDER — LOSARTAN POTASSIUM-HCTZ 100-25 MG PO TABS: 1.0000 | ORAL_TABLET | Freq: Every day | ORAL | Status: DC

## 2015-02-19 NOTE — Progress Notes (Signed)
OFFICE NOTE  Chief Complaint:  Routine follow-up, fell in his driveway over the weekend  Primary Care Physician: Pamelia Hoit, MD  HPI:  Vincent Barrett is a pleasant 82 year old male previously followed by Dr. Alanda Amass who is here today to establish cardiac care with me. His past medical history is significant for an episode of paroxysmal atrial fibrillation in June of 2013. He converted spontaneously back to sinus rhythm and seems to maintain that. Based on the relative infrequency of his episodes, he has been maintained on aspirin. He has as history of hypertension which is fairly well-controlled although he does have an element of whitecoat hypertension. He was a former smoker but quit over 50 years ago. He had a negative Myoview for ischemia in June of 2013.  He denies any chest pain or worsening shortness of breath.  Vincent Barrett returns today for follow-up. He denies any chest pain or worsening shortness of breath. He continues to have lower leg swelling. This may be related to the Norvasc although recent decreases in his Norvasc has not improved the swelling. He is on low-dose Hyzaar. He reports no recurrent atrial fibrillation. Is not clear whether he actually had A. fib in the past or perhaps very frequent PACs. Either way the burden of his A. fib is very low and therefore aspirin therapy is reasonable.  I saw Vincent Barrett back today in the office for follow-up. Unfortunately he fell on the snow and ice in his driveway on Saturday. He was seen in the [facility] ER and given some tramadol for pain. This seems to be not holding him very well. I defer to his primary care provider as he may need a little stronger pain medicine. He is using a pillow to splint his chest. Workup revealed some broken ribs and he had some soft tissue head trauma but little concern for significant head injury. Fortunately he is only on low-dose aspirin. Blood pressure surprisingly is well-controlled on his current  medicines. He denies any chest pain or palpitations or shortness of breath prior to his fall and of course now has some pleuritic chest wall pain secondary to his rib injury.  PMHx:  Past Medical History  Diagnosis Date  . PAF (paroxysmal atrial fibrillation) (HCC)   . Hypertension   . COPD (chronic obstructive pulmonary disease) (HCC)   . BPH (benign prostatic hyperplasia)   . OA (osteoarthritis)     Past Surgical History  Procedure Laterality Date  . Carpal tunnel release  02/2010    Dr. Franky Macho  . Transthoracic echocardiogram  2013    EF 52%, mild AV leaflet thickening; trace TR  . Nm myocar perf wall motion  08/08/2011    lexiscan; normal pattern of perfusion in all regions; post-stress EF 69%; low risk scan     FAMHx:  No family history on file.  SOCHx:   reports that he quit smoking about 52 years ago. He has never used smokeless tobacco. He reports that he does not drink alcohol or use illicit drugs.  ALLERGIES:  No Known Allergies  ROS: A comprehensive review of systems was negative except for: Musculoskeletal: positive for Left pleuritic chest wall pain  HOME MEDS: Current Outpatient Prescriptions  Medication Sig Dispense Refill  . amLODipine (NORVASC) 2.5 MG tablet Take 1 tablet (2.5 mg total) by mouth daily.    Marland Kitchen aspirin EC 325 MG tablet Take 325 mg by mouth once as needed for moderate pain.    Marland Kitchen aspirin EC 81 MG  tablet Take 81 mg by mouth daily.    Marland Kitchen dextromethorphan-guaiFENesin (MUCINEX DM) 30-600 MG 12hr tablet Take 1 tablet by mouth 2 (two) times daily as needed for cough.    . doxazosin (CARDURA) 4 MG tablet Take 4 mg by mouth daily.    Marland Kitchen losartan-hydrochlorothiazide (HYZAAR) 100-25 MG per tablet Take 1 tablet by mouth daily.    . metoprolol succinate (TOPROL-XL) 25 MG 24 hr tablet Take 1 tablet (25 mg total) by mouth daily. 90 tablet 3  . Omega-3 Fatty Acids (FISH OIL) 1000 MG CAPS Take 1 capsule by mouth daily.    . traMADol (ULTRAM) 50 MG tablet Take 1  tablet (50 mg total) by mouth every 6 (six) hours as needed. 20 tablet 0  . vitamin B-12 (CYANOCOBALAMIN) 1000 MCG tablet Take 1,000 mcg by mouth daily.    . vitamin C (ASCORBIC ACID) 500 MG tablet Take 1,000 mg by mouth daily.     . vitamin E 400 UNIT capsule Take 400 Units by mouth daily.     No current facility-administered medications for this visit.    LABS/IMAGING: No results found for this or any previous visit (from the past 48 hour(s)). Dg Ribs Unilateral W/chest Left  02/17/2015  CLINICAL DATA:  Left-sided rib pain status post fall. EXAM: LEFT RIBS AND CHEST - 3+ VIEW COMPARISON:  None. FINDINGS: There is a minimally displaced fracture of the posterior left eighth rib. No other displaced rib fractures are seen. There is no evidence of pneumothorax. Calcified atherosclerotic disease of the aorta is noted. Cardiac silhouette is mildly enlarged. There is no evidence of airspace consolidation or pleural effusion. IMPRESSION: Minimally displaced fracture of the posterior left eighth rib. Electronically Signed   By: Ted Mcalpine M.D.   On: 02/17/2015 20:53   Ct Head Wo Contrast  02/17/2015  CLINICAL DATA:  Slipped and fell on ice in driveway. LEFT headache, LEFT neck pain. History of hypertension. EXAM: CT HEAD WITHOUT CONTRAST CT CERVICAL SPINE WITHOUT CONTRAST TECHNIQUE: Multidetector CT imaging of the head and cervical spine was performed following the standard protocol without intravenous contrast. Multiplanar CT image reconstructions of the cervical spine were also generated. COMPARISON:  MRI of the cervical spine November 28, 2009 FINDINGS: CT HEAD FINDINGS Moderate to severe ventriculomegaly on the basis of global parenchymal brain volume loss. No intraparenchymal hemorrhage, mass effect nor midline shift. Patchy supratentorial white matter hypodensities are within normal range for patient's age and though non-specific suggest sequelae of chronic small vessel ischemic disease. No acute  large vascular territory infarcts. No abnormal extra-axial fluid collections. Basal cisterns are patent. Moderate calcific atherosclerosis of the carotid siphons. No skull fracture. The included ocular globes and orbital contents are non-suspicious. Status post bilateral ocular lens implants. Mild maxillary sinus mucosal thickening without air-fluid levels. Mastoid air cells are well aerated. Patient is edentulous. CT CERVICAL SPINE FINDINGS Cervical vertebral bodies and posterior elements are intact and aligned with straightened cervical lordosis. C1-2 articulation maintained with moderate arthropathy. Moderate C3-4 thru C6-7 disc height loss, mild at C7-T1 with uncovertebral hypertrophy. Mild upper cervical facet arthropathy. No destructive bony lesions. Moderate calcific atherosclerosis of the carotid bulbs. Fluid distended esophagus partially imaged. Nuchal ligament calcifications. Moderate to severe RIGHT C3-4 and bilateral C4-5 neural foraminal narrowing. Severe C5-6 and C6-7 neural foraminal narrowing. IMPRESSION: CT HEAD: No acute intracranial process. Moderate to severe global brain atrophy. Moderate chronic small vessel ischemic disease. CT CERVICAL SPINE: Straightened cervical lordosis without acute fracture or malalignment. Multilevel neural  foraminal narrowing: Severe at C5-6 and C6-7. Partially imaged fluid distended esophagus, aspiration risk. Electronically Signed   By: Awilda Metroourtnay  Bloomer M.D.   On: 02/17/2015 21:10   Ct Cervical Spine Wo Contrast  02/17/2015  CLINICAL DATA:  Slipped and fell on ice in driveway. LEFT headache, LEFT neck pain. History of hypertension. EXAM: CT HEAD WITHOUT CONTRAST CT CERVICAL SPINE WITHOUT CONTRAST TECHNIQUE: Multidetector CT imaging of the head and cervical spine was performed following the standard protocol without intravenous contrast. Multiplanar CT image reconstructions of the cervical spine were also generated. COMPARISON:  MRI of the cervical spine November 28, 2009 FINDINGS: CT HEAD FINDINGS Moderate to severe ventriculomegaly on the basis of global parenchymal brain volume loss. No intraparenchymal hemorrhage, mass effect nor midline shift. Patchy supratentorial white matter hypodensities are within normal range for patient's age and though non-specific suggest sequelae of chronic small vessel ischemic disease. No acute large vascular territory infarcts. No abnormal extra-axial fluid collections. Basal cisterns are patent. Moderate calcific atherosclerosis of the carotid siphons. No skull fracture. The included ocular globes and orbital contents are non-suspicious. Status post bilateral ocular lens implants. Mild maxillary sinus mucosal thickening without air-fluid levels. Mastoid air cells are well aerated. Patient is edentulous. CT CERVICAL SPINE FINDINGS Cervical vertebral bodies and posterior elements are intact and aligned with straightened cervical lordosis. C1-2 articulation maintained with moderate arthropathy. Moderate C3-4 thru C6-7 disc height loss, mild at C7-T1 with uncovertebral hypertrophy. Mild upper cervical facet arthropathy. No destructive bony lesions. Moderate calcific atherosclerosis of the carotid bulbs. Fluid distended esophagus partially imaged. Nuchal ligament calcifications. Moderate to severe RIGHT C3-4 and bilateral C4-5 neural foraminal narrowing. Severe C5-6 and C6-7 neural foraminal narrowing. IMPRESSION: CT HEAD: No acute intracranial process. Moderate to severe global brain atrophy. Moderate chronic small vessel ischemic disease. CT CERVICAL SPINE: Straightened cervical lordosis without acute fracture or malalignment. Multilevel neural foraminal narrowing: Severe at C5-6 and C6-7. Partially imaged fluid distended esophagus, aspiration risk. Electronically Signed   By: Awilda Metroourtnay  Bloomer M.D.   On: 02/17/2015 21:10    VITALS: Ht 5' 5.5" (1.664 m)  Wt 169 lb 3.2 oz (76.749 kg)  BMI 27.72 kg/m2  EXAM: General appearance: alert  and no distress Neck: no carotid bruit and no JVD Lungs: clear to auscultation bilaterally Heart: regular rate and rhythm, S1, S2 normal, no murmur, click, rub or gallop Abdomen: soft, non-tender; bowel sounds normal; no masses,  no organomegaly Extremities: edema 2+ RLE and 1+ LLE Pulses: 2+ and symmetric Skin: Skin color, texture, turgor normal. No rashes or lesions Neurologic: Grossly normal Psych: Mood, affect normal  EKG: Sinus rhythm at 84 with first-degree AV block and PACs  ASSESSMENT: 1. Hypertension-controlled 2. One episode of possible atrial fibrillation, without recurrence on aspirin 3. Leg edema  PLAN: 1.   Mr. Leamon Barrett is doing well without recurrence of palpitations or other tachyarrhythmias. EKG still shows sinus rhythm with frequent PACs. He is on daily aspirin. Unfortunately had a fall with some rib fracture and is in significant pain. He may need additional pain medicine. He also reported some constipation but he is wife mentioned that he is going to get MiraLAX and I also suggested stool softeners. His leg edema appears to be well-controlled as well. He is requested 90 day supplies of his medications and will provide those today.  Follow-up annually or sooner as necessary.  Chrystie NoseKenneth C. Hilty, MD, [facility] Attending Cardiologist [facility]  Chrystie NoseKenneth C Hilty 02/19/2015, 11:58 AM

## 2015-02-19 NOTE — Patient Instructions (Signed)
Your physician recommends that you schedule a follow-up appointment in: ONE YEAR WITH DR. HILTY.  

## 2015-04-27 ENCOUNTER — Other Ambulatory Visit: Payer: Self-pay | Admitting: Internal Medicine

## 2015-04-27 NOTE — Telephone Encounter (Signed)
Rx request sent to pharmacy.  

## 2016-02-15 ENCOUNTER — Other Ambulatory Visit: Payer: Self-pay

## 2016-02-15 MED ORDER — DOXAZOSIN MESYLATE 4 MG PO TABS: 4.0000 mg | ORAL_TABLET | Freq: Every day | ORAL | 0 refills | Status: DC

## 2016-02-21 ENCOUNTER — Ambulatory Visit (INDEPENDENT_AMBULATORY_CARE_PROVIDER_SITE_OTHER): Payer: Medicare Other | Admitting: Internal Medicine

## 2016-02-21 ENCOUNTER — Encounter: Payer: Self-pay | Admitting: Internal Medicine

## 2016-02-21 VITALS — BP 118/62 | HR 65 | Ht 65.5 in | Wt 165.0 lb

## 2016-02-21 DIAGNOSIS — R600 Localized edema: Secondary | ICD-10-CM | POA: Insufficient documentation

## 2016-02-21 DIAGNOSIS — I10 Essential (primary) hypertension: Secondary | ICD-10-CM

## 2016-02-21 DIAGNOSIS — I480 Paroxysmal atrial fibrillation: Secondary | ICD-10-CM | POA: Diagnosis not present

## 2016-02-21 MED ORDER — DOXAZOSIN MESYLATE 4 MG PO TABS: 4.0000 mg | ORAL_TABLET | Freq: Every day | ORAL | 3 refills | Status: DC

## 2016-02-21 MED ORDER — METOPROLOL SUCCINATE ER 25 MG PO TB24: 25.0000 mg | ORAL_TABLET | Freq: Every day | ORAL | 3 refills | Status: DC

## 2016-02-21 MED ORDER — LOSARTAN POTASSIUM-HCTZ 100-25 MG PO TABS: 1.0000 | ORAL_TABLET | Freq: Every day | ORAL | 3 refills | Status: DC

## 2016-02-21 MED ORDER — AMLODIPINE BESYLATE 2.5 MG PO TABS: 2.5000 mg | ORAL_TABLET | Freq: Every day | ORAL | 3 refills | Status: DC

## 2016-02-21 NOTE — Progress Notes (Signed)
OFFICE NOTE  Chief Complaint:  No complaints  Primary Care Physician: Pamelia HoitWILSON,FRED Ambers, MD  HPI:  Vincent KeyHenry Barrett is a pleasant 83 year old male previously followed by Dr. Alanda AmassWeintraub who is here today to establish cardiac care with me. His past medical history is significant for an episode of paroxysmal atrial fibrillation in June of 2013. He converted spontaneously back to sinus rhythm and seems to maintain that. Based on the relative infrequency of his episodes, he has been maintained on aspirin. He has as history of hypertension which is fairly well-controlled although he does have an element of whitecoat hypertension. He was a former smoker but quit over 50 years ago. He had a negative Myoview for ischemia in June of 2013.  He denies any chest pain or worsening shortness of breath.  Vincent Barrett returns today for follow-up. He denies any chest pain or worsening shortness of breath. He continues to have lower leg swelling. This may be related to the Norvasc although recent decreases in his Norvasc has not improved the swelling. He is on low-dose Hyzaar. He reports no recurrent atrial fibrillation. Is not clear whether he actually had A. fib in the past or perhaps very frequent PACs. Either way the burden of his A. fib is very low and therefore aspirin therapy is reasonable.  I saw Vincent Barrett back today in the office for follow-up. Unfortunately he fell on the snow and ice in his driveway on Saturday. [facility] was [facility] and given some tramadol for pain. This seems to be not holding him very well. I defer to his primary care provider as he may need a little stronger pain medicine. He is using a pillow to splint his chest. Workup revealed some broken ribs and he had some soft tissue head trauma but little concern for significant head injury. Fortunately he is only on low-dose aspirin. Blood pressure surprisingly is well-controlled on his current medicines. He denies any chest pain or  palpitations or shortness of breath prior to his fall and of course now has some pleuritic chest wall pain secondary to his rib injury.  02/21/2016  Vincent Barrett returns for follow-up. Over the past year he has no new complaints. He denies any chest pain or worsening shortness of breath. He occasionally gets some swelling in his right ankle. He was advised to wear compression stockings but is been hesitant to do that. Blood pressure is excellent at 118/62. He rarely takes aspirin, more likely as needed for headaches. EKG shows sinus rhythm with first-degree AV block at 65.  PMHx:  Past Medical History:  Diagnosis Date  . BPH (benign prostatic hyperplasia)   . COPD (chronic obstructive pulmonary disease) (HCC)   . Hypertension   . OA (osteoarthritis)   . PAF (paroxysmal atrial fibrillation) (HCC)     Past Surgical History:  Procedure Laterality Date  . CARPAL TUNNEL RELEASE  02/2010   Dr. Franky Machoabbell  . NM MYOCAR PERF WALL MOTION  08/08/2011   lexiscan; normal pattern of perfusion in all regions; post-stress EF 69%; low risk scan   . TRANSTHORACIC ECHOCARDIOGRAM  2013   EF 52%, mild AV leaflet thickening; trace TR    FAMHx:  No family history on file.  SOCHx:   reports that he quit smoking about 53 years ago. He has never used smokeless tobacco. He reports that he does not drink alcohol or use drugs.  ALLERGIES:  Allergies  Allergen Reactions  . Quinine Nausea And Vomiting  ROS: Pertinent items noted in HPI and remainder of comprehensive ROS otherwise negative.  HOME MEDS: Current Outpatient Prescriptions  Medication Sig Dispense Refill  . amLODipine (NORVASC) 2.5 MG tablet Take 1 tablet (2.5 mg total) by mouth daily. 90 tablet 3  . aspirin EC 81 MG tablet Take 81 mg by mouth as directed.     . doxazosin (CARDURA) 4 MG tablet Take 1 tablet (4 mg total) by mouth daily. 90 tablet 3  . losartan-hydrochlorothiazide (HYZAAR) 100-25 MG tablet Take 1 tablet by mouth daily. 90 tablet 3    . metoprolol succinate (TOPROL-XL) 25 MG 24 hr tablet Take 1 tablet (25 mg total) by mouth daily. 90 tablet 3  . Omega-3 Fatty Acids (FISH OIL) 1000 MG CAPS Take 1 capsule by mouth daily.    . vitamin B-12 (CYANOCOBALAMIN) 1000 MCG tablet Take 1,000 mcg by mouth daily.    . vitamin C (ASCORBIC ACID) 500 MG tablet Take 1,000 mg by mouth daily.     . vitamin E 400 UNIT capsule Take 400 Units by mouth daily.     No current facility-administered medications for this visit.     LABS/IMAGING: No results found for this or any previous visit (from the past 48 hour(s)). No results found.  VITALS: BP 118/62   Pulse 65   Ht 5' 5.5" (1.664 m)   Wt 165 lb (74.8 kg)   BMI 27.04 kg/m   EXAM: General appearance: alert and no distress Neck: no carotid bruit and no JVD Lungs: clear to auscultation bilaterally Heart: regular rate and rhythm, S1, S2 normal, no murmur, click, rub or gallop Abdomen: soft, non-tender; bowel sounds normal; no masses,  no organomegaly Extremities: edema 2+ RLE and 1+ LLE Pulses: 2+ and symmetric Skin: Skin color, texture, turgor normal. No rashes or lesions Neurologic: Grossly normal Psych: Mood, affect normal  EKG: Sinus rhythm with first-degree AV block at 65  ASSESSMENT: 1. Hypertension-controlled 2. One episode of possible atrial fibrillation (very remotely), without recurrence on aspirin 3. Right leg edema  PLAN: 1.   Mr. Barrett is doing well without recurrence of palpitations or other tachyarrhythmias. I have seen no evidence of recurrent atrial fibrillation he denies any palpitations. Is not even clear if he has a diagnosis of A. fib in the past. He takes only low-dose aspirin as needed. This is very reasonable. He does have some right lower chimney swelling and I recommended compression stocking use for this. Otherwise blood pressures well controlled and no medication changes were made today.  Follow-up annually or sooner as necessary.  Chrystie Nose, MD, [facility] Attending Cardiologist [facility]  Chrystie Nose 02/21/2016, 2:35 PM

## 2016-02-21 NOTE — Patient Instructions (Signed)
Your physician recommends that you continue on your current medications as directed. Please refer to the Current Medication list given to you today.  Your physician wants you to follow-up in: ONE YEAR with Dr. Hilty. You will receive a reminder letter in the mail two months in advance. If you don't receive a letter, please call our office to schedule the follow-up appointment.  

## 2016-04-13 ENCOUNTER — Other Ambulatory Visit: Payer: Self-pay | Admitting: Internal Medicine

## 2016-12-06 ENCOUNTER — Encounter (HOSPITAL_COMMUNITY): Payer: Self-pay | Admitting: Emergency Medicine

## 2016-12-06 ENCOUNTER — Emergency Department (HOSPITAL_COMMUNITY): Payer: Medicare Other

## 2016-12-06 ENCOUNTER — Emergency Department (HOSPITAL_COMMUNITY)
Admission: EM | Admit: 2016-12-06 | Discharge: 2016-12-06 | Disposition: A | Discharge status: Home / Self Care | Payer: Medicare Other | Attending: Emergency Medicine | Admitting: Emergency Medicine

## 2016-12-06 DIAGNOSIS — Z87891 Personal history of nicotine dependence: Secondary | ICD-10-CM | POA: Insufficient documentation

## 2016-12-06 DIAGNOSIS — Z79899 Other long term (current) drug therapy: Secondary | ICD-10-CM | POA: Diagnosis not present

## 2016-12-06 DIAGNOSIS — W19XXXD Unspecified fall, subsequent encounter: Secondary | ICD-10-CM | POA: Insufficient documentation

## 2016-12-06 DIAGNOSIS — S6991XD Unspecified injury of right wrist, hand and finger(s), subsequent encounter: Secondary | ICD-10-CM | POA: Diagnosis not present

## 2016-12-06 DIAGNOSIS — R911 Solitary pulmonary nodule: Secondary | ICD-10-CM | POA: Insufficient documentation

## 2016-12-06 DIAGNOSIS — R0789 Other chest pain: Secondary | ICD-10-CM | POA: Insufficient documentation

## 2016-12-06 DIAGNOSIS — J449 Chronic obstructive pulmonary disease, unspecified: Secondary | ICD-10-CM | POA: Diagnosis not present

## 2016-12-06 DIAGNOSIS — R52 Pain, unspecified: Secondary | ICD-10-CM

## 2016-12-06 DIAGNOSIS — I10 Essential (primary) hypertension: Secondary | ICD-10-CM | POA: Insufficient documentation

## 2016-12-06 DIAGNOSIS — Z7982 Long term (current) use of aspirin: Secondary | ICD-10-CM | POA: Diagnosis not present

## 2016-12-06 DIAGNOSIS — S0003XD Contusion of scalp, subsequent encounter: Secondary | ICD-10-CM | POA: Diagnosis not present

## 2016-12-06 DIAGNOSIS — S0990XD Unspecified injury of head, subsequent encounter: Secondary | ICD-10-CM | POA: Diagnosis not present

## 2016-12-06 MED ORDER — TRAMADOL HCL 50 MG PO TABS: 50.0000 mg | ORAL_TABLET | Freq: Four times a day (QID) | ORAL | 0 refills | Status: DC | PRN

## 2016-12-06 MED ORDER — CEPHALEXIN 500 MG PO CAPS: 500.0000 mg | ORAL_CAPSULE | Freq: Four times a day (QID) | ORAL | 0 refills | Status: DC

## 2016-12-06 MED ORDER — CEPHALEXIN 250 MG PO CAPS
500.0000 mg | ORAL_CAPSULE | Freq: Once | ORAL | Status: AC
  Administered 2016-12-06: 500 mg via ORAL
  Filled 2016-12-06: qty 2

## 2016-12-06 NOTE — ED Provider Notes (Addendum)
[facility] EMERGENCY DEPARTMENT Provider Note   CSN: [id number] Arrival date & time: 12/06/16  1425     History   Chief Complaint Chief Complaint  Patient presents with  . Fall  . Head Injury  . Finger Injury    HPI Vincent Barrett is a 83 y.o. male.  HPI 83 year old man presents today complaining of right third finger pain.  He fell 2 days ago moving a generator.  He injured his third finger, contused his head and his chest.  He was seen at an urgent care and had lab repair done.  He states that the finger continues to cause severe pain throbbing especially at night.  He has not been able to control the pain and comes in secondary to this.  States he bruised his head but did not lose consciousness and has not had any symptoms from this.  He states he also struck his chest and has had some pain with palpation.  Denies any loss of consciousness.  He is not on anticoagulation Past Medical History:  Diagnosis Date  . BPH (benign prostatic hyperplasia)   . COPD (chronic obstructive pulmonary disease) (HCC)   . Hypertension   . OA (osteoarthritis)   . PAF (paroxysmal atrial fibrillation) [facility])     Patient Active Problem List   Diagnosis Date Noted  . Leg edema, right 02/21/2016  . Constipation 02/19/2015  . PAF (paroxysmal atrial fibrillation) (HCC) 02/17/2013  . Essential hypertension 02/17/2013  . COPD (chronic obstructive pulmonary disease) (HCC) 02/17/2013    Past Surgical History:  Procedure Laterality Date  . CARPAL TUNNEL RELEASE  02/2010   Dr. Franky Macho  . NM MYOCAR PERF WALL MOTION  08/08/2011   lexiscan; normal pattern of perfusion in all regions; post-stress EF 69%; low risk scan   . TRANSTHORACIC ECHOCARDIOGRAM  2013   EF 52%, mild AV leaflet thickening; trace TR       Home Medications    Prior to Admission medications   Medication Sig Start Date End Date Taking? Authorizing Provider  amLODipine (NORVASC) 2.5 MG tablet Take 1 tablet (2.5 mg  total) by mouth daily. Patient taking differently: Take 2.5 mg by mouth 2 (two) times daily.  02/21/16  Yes Chrystie Nose, MD  aspirin EC 81 MG tablet Take 81 mg by mouth daily.    Yes [provider]  cephALEXin (KEFLEX) 500 MG capsule Take 500 mg by mouth 3 (three) times daily. 12/04/16  Yes [provider]  dextromethorphan-guaiFENesin (MUCINEX DM) 30-600 MG 12hr tablet Take 1 tablet by mouth 2 (two) times daily.   Yes [provider]  doxazosin (CARDURA) 4 MG tablet Take 1 tablet (4 mg total) by mouth daily. Patient taking differently: Take 4 mg by mouth 2 (two) times daily.  02/21/16  Yes Hilty, Lisette Abu, MD  Hypromellose (ARTIFICIAL TEARS OP) Place 1 drop into both eyes daily as needed (eye pain).   Yes [provider]  losartan-hydrochlorothiazide (HYZAAR) 100-25 MG tablet Take 1 tablet by mouth daily. Patient taking differently: Take 1 tablet by mouth 2 (two) times daily.  02/21/16  Yes Hilty, Lisette Abu, MD  metoprolol succinate (TOPROL-XL) 25 MG 24 hr tablet Take 1 tablet (25 mg total) by mouth daily. Patient taking differently: Take 25 mg by mouth 2 (two) times daily.  02/21/16  Yes Hilty, Lisette Abu, MD  Omega-3 Fatty Acids (FISH OIL) 1000 MG CAPS Take 1 capsule by mouth daily.   Yes [provider]  vitamin  B-12 (CYANOCOBALAMIN) 1000 MCG tablet Take 1,000 mcg by mouth daily.   Yes [provider]  vitamin C (ASCORBIC ACID) 500 MG tablet Take 1,000 mg by mouth daily.    Yes [provider]  vitamin E 400 UNIT capsule Take 400 Units by mouth daily.   Yes [provider]  metoprolol succinate (TOPROL-XL) 25 MG 24 hr tablet TAKE 1 TABLET BY MOUTH EVERY DAY Patient not taking: Reported on 12/06/2016 04/14/16   Chrystie Nose, MD    Family History No family history on file.  Social History Social History  Substance Use Topics  . Smoking status: Former Smoker    Quit date: 02/03/1963  . Smokeless tobacco: Never  Used  . Alcohol use No     Allergies   Quinine   Review of Systems Review of Systems  All other systems reviewed and are negative.    Physical Exam Updated Vital Signs BP (!) 175/160   Pulse (!) 54   Temp 97.9 F (36.6 C) (Oral)   Resp 18   Ht 1.651 m (5\' 5" )   Wt 72.6 kg (160 lb)   SpO2 95%   BMI 26.63 kg/m   Physical Exam  Constitutional: He is oriented to person, place, and time. He appears well-developed and well-nourished.  HENT:  Head: Normocephalic.  Right Ear: External ear normal.  Left Ear: External ear normal.  Nose: Nose normal.  Mouth/Throat: Oropharynx is clear and moist.  Forehead contusion noted  Eyes: Pupils are equal, round, and reactive to light. EOM are normal.  Neck: Normal range of motion. Neck supple.  Cardiovascular: Normal rate, regular rhythm, normal heart sounds and intact distal pulses.   Pulmonary/Chest: Effort normal and breath sounds normal. He exhibits tenderness.  Mild tenderness to palpation anterior chest no signs of trauma noted on external exam  Abdominal: Soft.  Musculoskeletal: Normal range of motion.  No tenderness to palpation over cervical, thoracic, or lumbar spine No signs of trauma noted on back Right third finger please see other documentation-on initial evaluation splint is in place  Neurological: He is alert and oriented to person, place, and time. He displays normal reflexes. No cranial nerve deficit. Coordination normal.  Skin: Skin is warm and dry. Capillary refill takes less than 2 seconds.  Psychiatric: He has a normal mood and affect.  Nursing note and vitals reviewed.    ED Treatments / Results  Labs (all labs ordered are listed, but only abnormal results are displayed) Labs Reviewed - No data to display  EKG  EKG Interpretation None       Radiology Dg Chest 2 View  Result Date: 12/06/2016 CLINICAL DATA:  83 year old male with fall. EXAM: CHEST  2 VIEW COMPARISON:  Chest radiograph dated  02/17/2015 FINDINGS: There is a 3 cm rounded density over the left lung base seen on the frontal projection. No corresponding density seen on the lateral view is. This may be artifactual and represent summation of overlapping structures or a skin fold. An area of infiltrate or a pulmonary nodule is not entirely excluded. Repeat radiograph with placement of nipple markers recommended. The lungs are otherwise clear. There is no pleural effusion or pneumothorax. The cardiac silhouette is within normal limits. Coronary vascular calcification as well as atherosclerotic calcification of the aortic arch and visualized thoracic aorta noted. There is osteopenia with degenerative changes of the spine. Old-appearing compression deformity of the lower thoracic spine with anterior wedging. No acute osseous pathology. IMPRESSION: 1. A 3 cm rounded  density over the left lung base, indeterminate. Repeat radiograph with placement of nipple markers recommended. No other acute cardiopulmonary process. 2. Atherosclerotic aorta. 3. Osteopenia with chronic appearing lower thoracic compression fracture and anterior wedging. Correlation with clinical exam and point tenderness recommended. Electronically Signed   By: Elgie CollardArash  Radparvar M.D.   On: 12/06/2016 20:17   Ct Head Wo Contrast  Result Date: 12/06/2016 CLINICAL DATA:  Headache after fall 2 days ago. EXAM: CT HEAD WITHOUT CONTRAST TECHNIQUE: Contiguous axial images were obtained from the base of the skull through the vertex without intravenous contrast. COMPARISON:  CT head dated February 17, 2015. FINDINGS: Brain: No evidence of acute infarction, hemorrhage, hydrocephalus, extra-axial collection or mass lesion/mass effect. Stable cerebral atrophy and chronic microvascular ischemic white matter disease. Vascular: Atherosclerotic vascular calcification of the carotid siphons. No hyperdense vessel. Skull: Normal. Negative for fracture or focal lesion. Sinuses/Orbits: No acute finding.  Other: None. IMPRESSION: No acute intracranial abnormality. Stable cerebral atrophy and chronic microvascular ischemic white matter disease. Electronically Signed   By: Obie DredgeWilliam T Derry M.D.   On: 12/06/2016 19:28   Dg Finger Middle Left  Result Date: 12/06/2016 CLINICAL DATA:  Fall. EXAM: LEFT MIDDLE FINGER 2+V COMPARISON:  None. FINDINGS: Comminuted, essentially nondisplaced fracture of the distal tuft. Punctate radiopaque densities in the volar soft tissues over the DIP joint. Degenerative changes of the DIP joint. Osteopenia. IMPRESSION: 1. Comminuted, essentially nondisplaced fracture of the middle finger distal tuft. 2. Punctate radiopaque densities in the volar soft tissues over the DIP joint. Correlate for foreign bodies. These also could be related to overlying bandage material. Electronically Signed   By: Obie DredgeWilliam T Derry M.D.   On: 12/06/2016 20:15    Procedures Procedures (including critical care time)  Medications Ordered in ED Medications - No data to display   Initial Impression / Assessment and Plan / ED Course  I have reviewed the triage vital signs and the nursing notes.  Pertinent labs & imaging results that were available during my care of the patient were reviewed by me and considered in my medical decision making (see chart for details).     1- mechanical fall 2- head contusion- no associated abnormality noted on ct 3- chest wall contusion 4 pulmonary nodule 5- chronic thoracic compression fracture- asymptomatic today 6-distal tuft fracture third finger right hand with wound requiring debridement  Final Clinical Impressions(s) / ED Diagnoses   Final diagnoses:  Pain  Injury of finger of right hand, subsequent encounter  Contusion of scalp, subsequent encounter  Pulmonary nodule    New Prescriptions New Prescriptions   No medications on file     Margarita Grizzleay, Brazil Voytko, MD 12/06/16 2152    Margarita Grizzleay, Kirk Basquez, MD 12/06/16 2158

## 2016-12-06 NOTE — Progress Notes (Signed)
Orthopedic Tech Progress Note Patient Details:  Vincent Barrett 07/30/1925 [id number]  Ortho Devices Type of Ortho Device: Finger splint Ortho Device/Splint Location: LUE Ortho Device/Splint Interventions: Ordered, Application   Jennye MoccasinHughes, Khiana Camino Craig 12/06/2016, 10:04 PM

## 2016-12-06 NOTE — ED Notes (Signed)
pt fell wednesday due to mechanical fall. injury to left middle finger, sutures present from [facility]. pt landed on his face, bruising to forehead. pt not on blood thinners.

## 2016-12-06 NOTE — Discharge Instructions (Signed)
Please take antibiotics as prescribed.  Recheck with Dr. Melvyn Novasrtmann this week.  Call Monday to make appointment

## 2016-12-06 NOTE — ED Triage Notes (Signed)
Pt c/o fall with head injury and injury to left middle finger on Wednesday, [facility]. Pt reports received stitches to left middle finger and is complaining of pain to that area.

## 2016-12-06 NOTE — ED Notes (Signed)
Pt taken to xray 

## 2017-01-05 ENCOUNTER — Other Ambulatory Visit: Payer: Self-pay | Admitting: Internal Medicine

## 2017-01-06 NOTE — Telephone Encounter (Signed)
REFILL 

## 2017-01-16 ENCOUNTER — Other Ambulatory Visit: Payer: Self-pay

## 2017-01-16 MED ORDER — DOXAZOSIN MESYLATE 4 MG PO TABS: 4.0000 mg | ORAL_TABLET | Freq: Every day | ORAL | 0 refills | Status: DC

## 2017-02-20 ENCOUNTER — Encounter: Payer: Self-pay | Admitting: Internal Medicine

## 2017-02-20 ENCOUNTER — Ambulatory Visit (INDEPENDENT_AMBULATORY_CARE_PROVIDER_SITE_OTHER): Payer: Medicare Other | Admitting: Internal Medicine

## 2017-02-20 VITALS — HR 50 | Resp 16 | Ht 65.0 in | Wt 164.0 lb

## 2017-02-20 DIAGNOSIS — I4891 Unspecified atrial fibrillation: Secondary | ICD-10-CM | POA: Diagnosis not present

## 2017-02-20 DIAGNOSIS — I484 Atypical atrial flutter: Secondary | ICD-10-CM | POA: Diagnosis not present

## 2017-02-20 DIAGNOSIS — Z79899 Other long term (current) drug therapy: Secondary | ICD-10-CM | POA: Diagnosis not present

## 2017-02-20 DIAGNOSIS — I4892 Unspecified atrial flutter: Secondary | ICD-10-CM | POA: Insufficient documentation

## 2017-02-20 DIAGNOSIS — I10 Essential (primary) hypertension: Secondary | ICD-10-CM | POA: Diagnosis not present

## 2017-02-20 MED ORDER — APIXABAN 5 MG PO TABS: 5.0000 mg | ORAL_TABLET | Freq: Two times a day (BID) | ORAL | 5 refills | Status: DC

## 2017-02-20 NOTE — Progress Notes (Signed)
OFFICE NOTE  Chief Complaint:  No complaints, however his wife notes fatigue  Primary Care Physician: [name], MD  HPI:  Vincent Barrett is a pleasant 84 year old male previously followed by Dr. Alanda Amass who is here today to establish cardiac care with me. His past medical history is significant for an episode of paroxysmal atrial fibrillation in June of 2013. He converted spontaneously back to sinus rhythm and seems to maintain that. Based on the relative infrequency of his episodes, he has been maintained on aspirin. He has as history of hypertension which is fairly well-controlled although he does have an element of whitecoat hypertension. He was a former smoker but quit over 50 years ago. He had a negative Myoview for ischemia in June of 2013.  He denies any chest pain or worsening shortness of breath.  Vincent Barrett returns today for follow-up. He denies any chest pain or worsening shortness of breath. He continues to have lower leg swelling. This may be related to the Norvasc although recent decreases in his Norvasc has not improved the swelling. He is on low-dose Hyzaar. He reports no recurrent atrial fibrillation. Is not clear whether he actually had A. fib in the past or perhaps very frequent PACs. Either way the burden of his A. fib is very low and therefore aspirin therapy is reasonable.  I saw Vincent Barrett back today in the office for follow-up. Unfortunately he fell on the snow and ice in his driveway on Saturday. He was seen in the [facility] ER and given some tramadol for pain. This seems to be not holding him very well. I defer to his primary care provider as he may need a little stronger pain medicine. He is using a pillow to splint his chest. Workup revealed some broken ribs and he had some soft tissue head trauma but little concern for significant head injury. Fortunately he is only on low-dose aspirin. Blood pressure surprisingly is well-controlled on his current medicines. He  denies any chest pain or palpitations or shortness of breath prior to his fall and of course now has some pleuritic chest wall pain secondary to his rib injury.  02/21/2016  Vincent Barrett returns for follow-up. Over the past year he has no new complaints. He denies any chest pain or worsening shortness of breath. He occasionally gets some swelling in his right ankle. He was advised to wear compression stockings but is been hesitant to do that. Blood pressure is excellent at 118/62. He rarely takes aspirin, more likely as needed for headaches. EKG shows sinus rhythm with first-degree AV block at 65.  02/20/2017  Vincent Barrett was seen today in follow-up.  He is again without any significant complaints.  An EKG today however shows that he is in atrial flutter with a slow ventricular response and heart rate of 49.  A flutter rate appears to be fairly quick around 300 beats a minute.  When asked about whether he is symptomatic with this he really did not describe any symptoms although his wife is noted over the past several months that he has had decreased energy level and seems to want to go back to bed later in the morning after waking up.  He denies any chest pain or worsening shortness of breath.  He was previously on aspirin for a remote history of atrial fibrillation however says he does not take it regularly.  Based on age history of hypertension his CHADSVASC score is 3.  PMHx:  Past Medical History:  Diagnosis Date  . BPH (benign prostatic hyperplasia)   . COPD (chronic obstructive pulmonary disease) (HCC)   . Hypertension   . OA (osteoarthritis)   . PAF (paroxysmal atrial fibrillation) (HCC)     Past Surgical History:  Procedure Laterality Date  . CARPAL TUNNEL RELEASE  02/2010   Dr. Franky Macho  . NM MYOCAR PERF WALL MOTION  08/08/2011   lexiscan; normal pattern of perfusion in all regions; post-stress EF 69%; low risk scan   . TRANSTHORACIC ECHOCARDIOGRAM  2013   EF 52%, mild AV leaflet  thickening; trace TR    FAMHx:  No family history on file.  SOCHx:   reports that he quit smoking about 54 years ago. he has never used smokeless tobacco. He reports that he does not drink alcohol or use drugs.  ALLERGIES:  Allergies  Allergen Reactions  . Quinine Nausea And Vomiting    ROS: Pertinent items noted in HPI and remainder of comprehensive ROS otherwise negative.  HOME MEDS: Current Outpatient Medications  Medication Sig Dispense Refill  . amLODipine (NORVASC) 2.5 MG tablet Take 1 tablet (2.5 mg total) by mouth daily. (Patient taking differently: Take 2.5 mg by mouth 2 (two) times daily. ) 90 tablet 3  . dextromethorphan-guaiFENesin (MUCINEX DM) 30-600 MG 12hr tablet Take 1 tablet by mouth 2 (two) times daily.    Marland Kitchen doxazosin (CARDURA) 4 MG tablet Take 1 tablet (4 mg total) by mouth daily. KEEP OV. 90 tablet 0  . Hypromellose (ARTIFICIAL TEARS OP) Place 1 drop into both eyes daily as needed (eye pain).    Marland Kitchen losartan-hydrochlorothiazide (HYZAAR) 100-25 MG tablet Take 1 tablet by mouth daily. (Patient taking differently: Take 1 tablet by mouth 2 (two) times daily. ) 90 tablet 3  . metoprolol succinate (TOPROL-XL) 25 MG 24 hr tablet Take 1 tablet (25 mg total) by mouth daily. (Patient taking differently: Take 25 mg by mouth 2 (two) times daily. ) 90 tablet 3  . Omega-3 Fatty Acids (FISH OIL) 1000 MG CAPS Take 1 capsule by mouth daily.    . traMADol (ULTRAM) 50 MG tablet Take 1 tablet (50 mg total) by mouth every 6 (six) hours as needed. 10 tablet 0  . vitamin B-12 (CYANOCOBALAMIN) 1000 MCG tablet Take 1,000 mcg by mouth daily.    . vitamin C (ASCORBIC ACID) 500 MG tablet Take 1,000 mg by mouth daily.     . vitamin E 400 UNIT capsule Take 400 Units by mouth daily.    Marland Kitchen apixaban (ELIQUIS) 5 MG TABS tablet Take 1 tablet (5 mg total) by mouth 2 (two) times daily. 60 tablet 5   No current facility-administered medications for this visit.     LABS/IMAGING: No results found for  this or any previous visit (from the past 48 hour(s)). No results found.  VITALS: Pulse (!) 50   Resp 16   Ht 5\' 5"  (1.651 m)   Wt 164 lb (74.4 kg)   SpO2 99%   BMI 27.29 kg/m   EXAM: General appearance: alert and no distress Neck: no carotid bruit and no JVD Lungs: clear to auscultation bilaterally Heart: regular rate and rhythm, S1, S2 normal, no murmur, click, rub or gallop Abdomen: soft, non-tender; bowel sounds normal; no masses,  no organomegaly Extremities: edema 2+ RLE and 1+ LLE Pulses: 2+ and symmetric Skin: Skin color, texture, turgor normal. No rashes or lesions Neurologic: Grossly normal Psych: Mood, affect normal  EKG: Atrial flutter with slow ventricular response at 49 personally reviewed  ASSESSMENT: 1. Newly diagnosed atrial flutter with slow ventricular response 2. CHADSVASC score of 3 3. Hypertension-controlled 4. Remote episode of paroxysmal atrial fibrillation 5. Right leg edema  PLAN: 1.   Vincent Barrett was incidentally found to be in atrial flutter with a slow ventricular response today.  He is on no rate controlling agents.  His CHADSVASC score is 3 and I would advise starting Eliquis 5 mg twice daily.  He is not had any recent blood work and will obtain a metabolic profile.  I am recommending an attempt at cardioversion after 3 weeks of anticoagulation.  His wife reports he has been more fatigued recently and this is likely the cause of it.  He has a remote episode of A. fib and was on aspirin but did not take it regularly.  He will not need to be on aspirin once starting on Eliquis.  Plan to see him back in a month and if he is been compliant with his anticoagulation and will consider a cardioversion at that time.  Chrystie NoseKenneth C. Shaterrica Territo, MD, [facility] HospitalFACC, FACP  [place]  [facility]  Medical Director of the [facility]  [facility] Diplomate of the American Board of Clinical Lipidology Attending Cardiologist  Direct  Dial: [phone]  Fax: [phone]  Website:  [email or web address]  Lisette AbuKenneth C Christinia Lambeth 02/20/2017, 1:24 PM

## 2017-02-20 NOTE — Patient Instructions (Addendum)
Your physician has recommended you make the following change in your medication:  -- START eliquis 5mg  twice daily  - samples (2 boxes) + free 30 day card provided + patient assistance application  -- STOP aspirin  Your physician recommends that you return for lab work - BMET, TSH  ** it is VERY IMPORTANT that you do not miss any doses of this medication  Your physician recommends that you schedule a follow-up appointment in ONE MONTH with Dr. Rennis GoldenHilty (with EKG)

## 2017-02-21 LAB — BASIC METABOLIC PANEL
BUN/Creatinine Ratio: 14 (ref 10–24)
BUN: 22 mg/dL (ref 8–27)
CALCIUM: 9.2 mg/dL (ref 8.6–10.2)
CHLORIDE: 104 mmol/L (ref 96–106)
CO2: 24 mmol/L (ref 20–29)
Creatinine, Ser: 1.54 mg/dL — ABNORMAL HIGH (ref 0.76–1.27)
GFR calc Af Amer: 46 mL/min/{1.73_m2} — ABNORMAL LOW (ref >59)
GFR calc non Af Amer: 39 mL/min/{1.73_m2} — ABNORMAL LOW (ref >59)
GLUCOSE: 99 mg/dL (ref 65–99)
POTASSIUM: 4.6 mmol/L (ref 3.5–5.2)
Sodium: 145 mmol/L — ABNORMAL HIGH (ref 134–144)

## 2017-02-21 LAB — TSH: TSH: 5.17 u[IU]/mL — AB (ref 0.450–4.500)

## 2017-03-23 ENCOUNTER — Encounter: Payer: Self-pay | Admitting: Internal Medicine

## 2017-03-23 ENCOUNTER — Ambulatory Visit (INDEPENDENT_AMBULATORY_CARE_PROVIDER_SITE_OTHER): Payer: Medicare Other | Admitting: Internal Medicine

## 2017-03-23 VITALS — BP 137/61 | HR 49 | Ht 65.0 in | Wt 166.0 lb

## 2017-03-23 DIAGNOSIS — Z7901 Long term (current) use of anticoagulants: Secondary | ICD-10-CM | POA: Diagnosis not present

## 2017-03-23 DIAGNOSIS — I4892 Unspecified atrial flutter: Secondary | ICD-10-CM | POA: Diagnosis not present

## 2017-03-23 NOTE — Patient Instructions (Addendum)
Your physician recommends that you schedule a follow-up appointment in: 3-4 weeks with MD or APP after your procedure.   You are scheduled for a Cardioversion on Mar 31, 2017 with Dr. Delton SeeNelson.  [facility]: [address] [place], [place] [address] at 11 am. (1 hour prior to procedure unless lab work is needed; if lab work is needed arrive 1.5 hours ahead)  DIET: Nothing to eat or drink after midnight except a sip of water with medications (see medication instructions below)  Medication Instructions: Hold METOPROLOL SUCCINATE (Toprol XL) the morning of your procedure  Continue your anticoagulant: Eliquis You will need to continue your anticoagulant after your procedure until you are told by your provider that it is safe to stop   Labs: TODAY  You must have a responsible person to drive you home and stay in the waiting area during your procedure. Failure to do so could result in cancellation.  Bring your insurance cards.  *Special Note: Every effort is made to have your procedure done on time. Occasionally there are emergencies that occur at the hospital that may cause delays. Please be patient if a delay does occur.

## 2017-03-24 LAB — BASIC METABOLIC PANEL
BUN / CREAT RATIO: 13 (ref 10–24)
BUN: 19 mg/dL (ref 8–27)
CALCIUM: 8.9 mg/dL (ref 8.6–10.2)
CO2: 25 mmol/L (ref 20–29)
CREATININE: 1.48 mg/dL — AB (ref 0.76–1.27)
Chloride: 100 mmol/L (ref 96–106)
GFR, EST AFRICAN AMERICAN: 48 mL/min/{1.73_m2} — AB (ref >59)
GFR, EST NON AFRICAN AMERICAN: 41 mL/min/{1.73_m2} — AB (ref >59)
Glucose: 84 mg/dL (ref 65–99)
Potassium: 4.3 mmol/L (ref 3.5–5.2)
Sodium: 138 mmol/L (ref 134–144)

## 2017-03-24 LAB — CBC
HEMOGLOBIN: 13.1 g/dL (ref 13.0–17.7)
Hematocrit: 38.4 % (ref 37.5–51.0)
MCH: 30.8 pg (ref 26.6–33.0)
MCHC: 34.1 g/dL (ref 31.5–35.7)
MCV: 90 fL (ref 79–97)
Platelets: 213 10*3/uL (ref 150–379)
RBC: 4.25 x10E6/uL (ref 4.14–5.80)
RDW: 13.8 % (ref 12.3–15.4)
WBC: 6.8 10*3/uL (ref 3.4–10.8)

## 2017-03-24 LAB — PROTIME-INR
INR: 1.1 (ref 0.8–1.2)
Prothrombin Time: 11.4 s (ref 9.1–12.0)

## 2017-03-24 LAB — APTT: aPTT: 31 s (ref 24–33)

## 2017-03-25 ENCOUNTER — Encounter: Payer: Self-pay | Admitting: Internal Medicine

## 2017-03-25 DIAGNOSIS — Z7901 Long term (current) use of anticoagulants: Secondary | ICD-10-CM | POA: Insufficient documentation

## 2017-03-25 NOTE — Progress Notes (Signed)
OFFICE NOTE  Chief Complaint:  Follow-up atrial flutter  Primary Care Physician: Barbie BannerWilson, Fred H, MD  HPI:  Vincent Barrett is a pleasant 84 year old male previously followed by Dr. Alanda AmassWeintraub who is here today to establish cardiac care with me. His past medical history is significant for an episode of paroxysmal atrial fibrillation in June of 2013. He converted spontaneously back to sinus rhythm and seems to maintain that. Based on the relative infrequency of his episodes, he has been maintained on aspirin. He has as history of hypertension which is fairly well-controlled although he does have an element of whitecoat hypertension. He was a former smoker but quit over 50 years ago. He had a negative Myoview for ischemia in June of 2013.  He denies any chest pain or worsening shortness of breath.  Vincent Barrett returns today for follow-up. He denies any chest pain or worsening shortness of breath. He continues to have lower leg swelling. This may be related to the Norvasc although recent decreases in his Norvasc has not improved the swelling. He is on low-dose Hyzaar. He reports no recurrent atrial fibrillation. Is not clear whether he actually had A. fib in the past or perhaps very frequent PACs. Either way the burden of his A. fib is very low and therefore aspirin therapy is reasonable.  I saw Vincent Barrett back today in the office for follow-up. Unfortunately he fell on the snow and ice in his driveway on Saturday. He was seen in the [facility] and given some tramadol for pain. This seems to be not holding him very well. I defer to his primary care provider as he may need a little stronger pain medicine. He is using a pillow to splint his chest. Workup revealed some broken ribs and he had some soft tissue head trauma but little concern for significant head injury. Fortunately he is only on low-dose aspirin. Blood pressure surprisingly is well-controlled on his current medicines. He denies any chest pain or  palpitations or shortness of breath prior to his fall and of course now has some pleuritic chest wall pain secondary to his rib injury.  02/21/2016  Vincent Barrett returns for follow-up. Over the past year he has no new complaints. He denies any chest pain or worsening shortness of breath. He occasionally gets some swelling in his right ankle. He was advised to wear compression stockings but is been hesitant to do that. Blood pressure is excellent at 118/62. He rarely takes aspirin, more likely as needed for headaches. EKG shows sinus rhythm with first-degree AV block at 65.  02/20/2017  Vincent Barrett was seen today in follow-up.  He is again without any significant complaints.  An EKG today however shows that he is in atrial flutter with a slow ventricular response and heart rate of 49.  A flutter rate appears to be fairly quick around 300 beats a minute.  When asked about whether he is symptomatic with this he really did not describe any symptoms although his wife is noted over the past several months that he has had decreased energy level and seems to want to go back to bed later in the morning after waking up.  He denies any chest pain or worsening shortness of breath.  He was previously on aspirin for a remote history of atrial fibrillation however says he does not take it regularly.  Based on age history of hypertension his CHADSVASC score is 3.  03/25/2017  Vincent Barrett returns today for follow-up of  atrial flutter.  He is persistently in atrial flutter with slow ventricular response and heart rate of 49.  He is on metoprolol succinate 25 mg daily.  Despite the slow ventricular response he is asymptomatic.  We again discussed the possibility of cardioversion.  He is not sure if he would benefit from it but his wife noted that he has been more fatigued.  Her concern is that she also had atrial fibrillation and did not respond to cardioversion.  That being said, he may very well respond to this treatment.  I  suspect has been in flutter since he had a trauma with fall in the fall of last year.  Reports good compliance with his Eliquis twice daily.  PMHx:  Past Medical History:  Diagnosis Date  . BPH (benign prostatic hyperplasia)   . COPD (chronic obstructive pulmonary disease) (HCC)   . Hypertension   . OA (osteoarthritis)   . PAF (paroxysmal atrial fibrillation) (HCC)     Past Surgical History:  Procedure Laterality Date  . CARPAL TUNNEL RELEASE  02/2010   Dr. Franky Macho  . NM MYOCAR PERF WALL MOTION  08/08/2011   lexiscan; normal pattern of perfusion in all regions; post-stress EF 69%; low risk scan   . TRANSTHORACIC ECHOCARDIOGRAM  2013   EF 52%, mild AV leaflet thickening; trace TR    FAMHx:  No family history on file.  SOCHx:   reports that he quit smoking about 54 years ago. he has never used smokeless tobacco. He reports that he does not drink alcohol or use drugs.  ALLERGIES:  Allergies  Allergen Reactions  . Quinine Nausea And Vomiting    ROS: Pertinent items noted in HPI and remainder of comprehensive ROS otherwise negative.  HOME MEDS: Current Outpatient Medications  Medication Sig Dispense Refill  . amLODipine (NORVASC) 2.5 MG tablet Take 1 tablet (2.5 mg total) by mouth daily. 90 tablet 3  . apixaban (ELIQUIS) 5 MG TABS tablet Take 1 tablet (5 mg total) by mouth 2 (two) times daily. 60 tablet 5  . dextromethorphan-guaiFENesin (MUCINEX DM) 30-600 MG 12hr tablet Take 1 tablet by mouth 2 (two) times daily.    Marland Kitchen doxazosin (CARDURA) 4 MG tablet Take 1 tablet (4 mg total) by mouth daily. KEEP OV. 90 tablet 0  . Hypromellose (ARTIFICIAL TEARS OP) Place 1 drop into both eyes daily as needed (eye pain).    Marland Kitchen losartan-hydrochlorothiazide (HYZAAR) 100-25 MG tablet Take 1 tablet by mouth daily. 90 tablet 3  . metoprolol succinate (TOPROL-XL) 25 MG 24 hr tablet Take 1 tablet (25 mg total) by mouth daily. 90 tablet 3  . traMADol (ULTRAM) 50 MG tablet Take 1 tablet (50 mg total)  by mouth every 6 (six) hours as needed. (Patient not taking: Reported on 03/23/2017) 10 tablet 0   No current facility-administered medications for this visit.     LABS/IMAGING: Results for orders placed or performed in visit on 03/23/17 (from the past 48 hour(s))  Basic metabolic panel     Status: Abnormal   Collection Time: 03/23/17 12:43 PM  Result Value Ref Range   Glucose 84 65 - 99 mg/dL   BUN 19 8 - 27 mg/dL   Creatinine, Ser 7.82 (H) 0.76 - 1.27 mg/dL   GFR calc non Af Amer 41 (L) >59 mL/min/1.73   GFR calc Af Amer 48 (L) >59 mL/min/1.73   BUN/Creatinine Ratio 13 10 - 24   Sodium 138 134 - 144 mmol/L   Potassium 4.3 3.5 - 5.2  mmol/L   Chloride 100 96 - 106 mmol/L   CO2 25 20 - 29 mmol/L   Calcium 8.9 8.6 - 10.2 mg/dL  APTT     Status: None   Collection Time: 03/23/17 12:43 PM  Result Value Ref Range   aPTT 31 24 - 33 sec    Comment: This test has not been validated for monitoring unfractionated heparin therapy. aPTT-based therapeutic ranges for unfractionated heparin therapy have not been established. For general guidelines on Heparin monitoring, refer to the [employer].   CBC     Status: None   Collection Time: 03/23/17 12:43 PM  Result Value Ref Range   WBC 6.8 3.4 - 10.8 x10E3/uL   RBC 4.25 4.14 - 5.80 x10E6/uL   Hemoglobin 13.1 13.0 - 17.7 g/dL   Hematocrit 16.1 09.6 - 51.0 %   MCV 90 79 - 97 fL   MCH 30.8 26.6 - 33.0 pg   MCHC 34.1 31.5 - 35.7 g/dL   RDW 04.5 40.9 - 81.1 %   Platelets 213 150 - 379 x10E3/uL  Protime-INR     Status: None   Collection Time: 03/23/17 12:43 PM  Result Value Ref Range   INR 1.1 0.8 - 1.2    Comment: Reference interval is for non-anticoagulated patients. Suggested INR therapeutic range for Vitamin K antagonist therapy:    Standard Dose (moderate intensity                   therapeutic range):       2.0 - 3.0    Higher intensity therapeutic range       2.5 - 3.5    Prothrombin Time 11.4 9.1 - 12.0 sec    No results found.  VITALS: BP 137/61   Pulse (!) 49   Ht 5\' 5"  (1.651 m)   Wt 166 lb (75.3 kg)   BMI 27.62 kg/m   EXAM: General appearance: alert and no distress Neck: no carotid bruit and no JVD Lungs: clear to auscultation bilaterally Heart: regular rate and rhythm, S1, S2 normal, no murmur, click, rub or gallop Abdomen: soft, non-tender; bowel sounds normal; no masses,  no organomegaly Extremities: edema 2+ RLE and 1+ LLE Pulses: 2+ and symmetric Skin: Skin color, texture, turgor normal. No rashes or lesions Neurologic: Grossly normal Psych: Mood, affect normal  EKG: Atrial flutter with slow ventricular response at 49 personally reviewed  ASSESSMENT: 1. Newly diagnosed atrial flutter with slow ventricular response 2. CHADSVASC score of 3 - on Eliquis 3. Hypertension-controlled 4. Remote episode of paroxysmal atrial fibrillation 5. Right leg edema  PLAN: 1.   Mr. Crill has completed 3 weeks of anticoagulation on Eliquis and has not missed any doses.  He is agreeable to cardioversion today.  We discussed the risk, benefits and alternatives of electrical cardioversion and the fact that it may not be successful or if he successfully convert he may potentially go back into flutter again.  He could also cardioverted and atrial fibrillation.  That being said, he is willing to proceed.  We will try to arrange that in the near future.  He is advised not to miss any doses of his Eliquis.  He is also advised to hold the dose of metoprolol on the day of the procedure since he is in slow ventricular response.  Follow-up with me after his cardioversion.  Chrystie Nose, MD, [facility], FACP  [place]  [facility]  Medical Director of the Advanced Lipid Disorders &  Cardiovascular Risk Reduction Clinic Diplomate of the American Board of Clinical Lipidology Attending Cardiologist  Direct Dial: [phone]  Fax: [phone]  Website:  [email or web address]  Aleeha Boline 03/25/2017, 11:10 AM

## 2017-03-25 NOTE — H&P (View-Only) (Signed)
OFFICE NOTE  Chief Complaint:  Follow-up atrial flutter  Primary Care Physician: Barbie BannerWilson, Fred H, MD  HPI:  Vincent Barrett is a pleasant 84 year old male previously followed by Dr. Alanda AmassWeintraub who is here today to establish cardiac care with me. His past medical history is significant for an episode of paroxysmal atrial fibrillation in June of 2013. He converted spontaneously back to sinus rhythm and seems to maintain that. Based on the relative infrequency of his episodes, he has been maintained on aspirin. He has as history of hypertension which is fairly well-controlled although he does have an element of whitecoat hypertension. He was a former smoker but quit over 50 years ago. He had a negative Myoview for ischemia in June of 2013.  He denies any chest pain or worsening shortness of breath.  Vincent Barrett returns today for follow-up. He denies any chest pain or worsening shortness of breath. He continues to have lower leg swelling. This may be related to the Norvasc although recent decreases in his Norvasc has not improved the swelling. He is on low-dose Hyzaar. He reports no recurrent atrial fibrillation. Is not clear whether he actually had A. fib in the past or perhaps very frequent PACs. Either way the burden of his A. fib is very low and therefore aspirin therapy is reasonable.  I saw Vincent Barrett back today in the office for follow-up. Unfortunately he fell on the snow and ice in his driveway on Saturday. He was seen in the [facility] and given some tramadol for pain. This seems to be not holding him very well. I defer to his primary care provider as he may need a little stronger pain medicine. He is using a pillow to splint his chest. Workup revealed some broken ribs and he had some soft tissue head trauma but little concern for significant head injury. Fortunately he is only on low-dose aspirin. Blood pressure surprisingly is well-controlled on his current medicines. He denies any chest pain or  palpitations or shortness of breath prior to his fall and of course now has some pleuritic chest wall pain secondary to his rib injury.  02/21/2016  Vincent Barrett returns for follow-up. Over the past year he has no new complaints. He denies any chest pain or worsening shortness of breath. He occasionally gets some swelling in his right ankle. He was advised to wear compression stockings but is been hesitant to do that. Blood pressure is excellent at 118/62. He rarely takes aspirin, more likely as needed for headaches. EKG shows sinus rhythm with first-degree AV block at 65.  02/20/2017  Vincent Barrett was seen today in follow-up.  He is again without any significant complaints.  An EKG today however shows that he is in atrial flutter with a slow ventricular response and heart rate of 49.  A flutter rate appears to be fairly quick around 300 beats a minute.  When asked about whether he is symptomatic with this he really did not describe any symptoms although his wife is noted over the past several months that he has had decreased energy level and seems to want to go back to bed later in the morning after waking up.  He denies any chest pain or worsening shortness of breath.  He was previously on aspirin for a remote history of atrial fibrillation however says he does not take it regularly.  Based on age history of hypertension his CHADSVASC score is 3.  03/25/2017  Vincent Barrett returns today for follow-up of  atrial flutter.  He is persistently in atrial flutter with slow ventricular response and heart rate of 49.  He is on metoprolol succinate 25 mg daily.  Despite the slow ventricular response he is asymptomatic.  We again discussed the possibility of cardioversion.  He is not sure if he would benefit from it but his wife noted that he has been more fatigued.  Her concern is that she also had atrial fibrillation and did not respond to cardioversion.  That being said, he may very well respond to this treatment.  I  suspect has been in flutter since he had a trauma with fall in the fall of last year.  Reports good compliance with his Eliquis twice daily.  PMHx:  Past Medical History:  Diagnosis Date  . BPH (benign prostatic hyperplasia)   . COPD (chronic obstructive pulmonary disease) (HCC)   . Hypertension   . OA (osteoarthritis)   . PAF (paroxysmal atrial fibrillation) (HCC)     Past Surgical History:  Procedure Laterality Date  . CARPAL TUNNEL RELEASE  02/2010   Dr. Franky Macho  . NM MYOCAR PERF WALL MOTION  08/08/2011   lexiscan; normal pattern of perfusion in all regions; post-stress EF 69%; low risk scan   . TRANSTHORACIC ECHOCARDIOGRAM  2013   EF 52%, mild AV leaflet thickening; trace TR    FAMHx:  No family history on file.  SOCHx:   reports that he quit smoking about 54 years ago. he has never used smokeless tobacco. He reports that he does not drink alcohol or use drugs.  ALLERGIES:  Allergies  Allergen Reactions  . Quinine Nausea And Vomiting    ROS: Pertinent items noted in HPI and remainder of comprehensive ROS otherwise negative.  HOME MEDS: Current Outpatient Medications  Medication Sig Dispense Refill  . amLODipine (NORVASC) 2.5 MG tablet Take 1 tablet (2.5 mg total) by mouth daily. 90 tablet 3  . apixaban (ELIQUIS) 5 MG TABS tablet Take 1 tablet (5 mg total) by mouth 2 (two) times daily. 60 tablet 5  . dextromethorphan-guaiFENesin (MUCINEX DM) 30-600 MG 12hr tablet Take 1 tablet by mouth 2 (two) times daily.    Marland Kitchen doxazosin (CARDURA) 4 MG tablet Take 1 tablet (4 mg total) by mouth daily. KEEP OV. 90 tablet 0  . Hypromellose (ARTIFICIAL TEARS OP) Place 1 drop into both eyes daily as needed (eye pain).    Marland Kitchen losartan-hydrochlorothiazide (HYZAAR) 100-25 MG tablet Take 1 tablet by mouth daily. 90 tablet 3  . metoprolol succinate (TOPROL-XL) 25 MG 24 hr tablet Take 1 tablet (25 mg total) by mouth daily. 90 tablet 3  . traMADol (ULTRAM) 50 MG tablet Take 1 tablet (50 mg total)  by mouth every 6 (six) hours as needed. (Patient not taking: Reported on 03/23/2017) 10 tablet 0   No current facility-administered medications for this visit.     LABS/IMAGING: Results for orders placed or performed in visit on 03/23/17 (from the past 48 hour(s))  Basic metabolic panel     Status: Abnormal   Collection Time: 03/23/17 12:43 PM  Result Value Ref Range   Glucose 84 65 - 99 mg/dL   BUN 19 8 - 27 mg/dL   Creatinine, Ser 7.82 (H) 0.76 - 1.27 mg/dL   GFR calc non Af Amer 41 (L) >59 mL/min/1.73   GFR calc Af Amer 48 (L) >59 mL/min/1.73   BUN/Creatinine Ratio 13 10 - 24   Sodium 138 134 - 144 mmol/L   Potassium 4.3 3.5 - 5.2  mmol/L   Chloride 100 96 - 106 mmol/L   CO2 25 20 - 29 mmol/L   Calcium 8.9 8.6 - 10.2 mg/dL  APTT     Status: None   Collection Time: 03/23/17 12:43 PM  Result Value Ref Range   aPTT 31 24 - 33 sec    Comment: This test has not been validated for monitoring unfractionated heparin therapy. aPTT-based therapeutic ranges for unfractionated heparin therapy have not been established. For general guidelines on Heparin monitoring, refer to the [employer].   CBC     Status: None   Collection Time: 03/23/17 12:43 PM  Result Value Ref Range   WBC 6.8 3.4 - 10.8 x10E3/uL   RBC 4.25 4.14 - 5.80 x10E6/uL   Hemoglobin 13.1 13.0 - 17.7 g/dL   Hematocrit 16.1 09.6 - 51.0 %   MCV 90 79 - 97 fL   MCH 30.8 26.6 - 33.0 pg   MCHC 34.1 31.5 - 35.7 g/dL   RDW 04.5 40.9 - 81.1 %   Platelets 213 150 - 379 x10E3/uL  Protime-INR     Status: None   Collection Time: 03/23/17 12:43 PM  Result Value Ref Range   INR 1.1 0.8 - 1.2    Comment: Reference interval is for non-anticoagulated patients. Suggested INR therapeutic range for Vitamin K antagonist therapy:    Standard Dose (moderate intensity                   therapeutic range):       2.0 - 3.0    Higher intensity therapeutic range       2.5 - 3.5    Prothrombin Time 11.4 9.1 - 12.0 sec    No results found.  VITALS: BP 137/61   Pulse (!) 49   Ht 5\' 5"  (1.651 m)   Wt 166 lb (75.3 kg)   BMI 27.62 kg/m   EXAM: General appearance: alert and no distress Neck: no carotid bruit and no JVD Lungs: clear to auscultation bilaterally Heart: regular rate and rhythm, S1, S2 normal, no murmur, click, rub or gallop Abdomen: soft, non-tender; bowel sounds normal; no masses,  no organomegaly Extremities: edema 2+ RLE and 1+ LLE Pulses: 2+ and symmetric Skin: Skin color, texture, turgor normal. No rashes or lesions Neurologic: Grossly normal Psych: Mood, affect normal  EKG: Atrial flutter with slow ventricular response at 49 personally reviewed  ASSESSMENT: 1. Newly diagnosed atrial flutter with slow ventricular response 2. CHADSVASC score of 3 - on Eliquis 3. Hypertension-controlled 4. Remote episode of paroxysmal atrial fibrillation 5. Right leg edema  PLAN: 1.   Vincent Barrett has completed 3 weeks of anticoagulation on Eliquis and has not missed any doses.  He is agreeable to cardioversion today.  We discussed the risk, benefits and alternatives of electrical cardioversion and the fact that it may not be successful or if he successfully convert he may potentially go back into flutter again.  He could also cardioverted and atrial fibrillation.  That being said, he is willing to proceed.  We will try to arrange that in the near future.  He is advised not to miss any doses of his Eliquis.  He is also advised to hold the dose of metoprolol on the day of the procedure since he is in slow ventricular response.  Follow-up with me after his cardioversion.  Chrystie Nose, MD, [facility], FACP  [place]  [facility]  Medical Director of the Advanced Lipid Disorders &  Cardiovascular Risk Reduction Clinic Diplomate of the American Board of Clinical Lipidology Attending Cardiologist  Direct Dial: [phone]  Fax: [phone]  Website:  [email or web address]  Hilty 03/25/2017, 11:10 AM

## 2017-03-31 ENCOUNTER — Ambulatory Visit (HOSPITAL_COMMUNITY): Payer: Medicare Other | Admitting: Certified Registered Nurse Anesthetist

## 2017-03-31 ENCOUNTER — Ambulatory Visit (HOSPITAL_COMMUNITY)
Admission: RE | Admit: 2017-03-31 | Discharge: 2017-03-31 | Disposition: A | Discharge status: Home / Self Care | Payer: Medicare Other | Source: Ambulatory Visit | Attending: Cardiology | Admitting: Cardiology

## 2017-03-31 ENCOUNTER — Encounter (HOSPITAL_COMMUNITY)
Admission: RE | Disposition: A | Discharge status: Home / Self Care | Payer: Self-pay | Source: Ambulatory Visit | Attending: Cardiology

## 2017-03-31 ENCOUNTER — Encounter (HOSPITAL_COMMUNITY): Payer: Self-pay | Admitting: *Deleted

## 2017-03-31 DIAGNOSIS — Z79899 Other long term (current) drug therapy: Secondary | ICD-10-CM | POA: Insufficient documentation

## 2017-03-31 DIAGNOSIS — J449 Chronic obstructive pulmonary disease, unspecified: Secondary | ICD-10-CM | POA: Diagnosis not present

## 2017-03-31 DIAGNOSIS — N400 Enlarged prostate without lower urinary tract symptoms: Secondary | ICD-10-CM | POA: Diagnosis not present

## 2017-03-31 DIAGNOSIS — Z7901 Long term (current) use of anticoagulants: Secondary | ICD-10-CM | POA: Insufficient documentation

## 2017-03-31 DIAGNOSIS — M1990 Unspecified osteoarthritis, unspecified site: Secondary | ICD-10-CM | POA: Diagnosis not present

## 2017-03-31 DIAGNOSIS — I10 Essential (primary) hypertension: Secondary | ICD-10-CM | POA: Diagnosis not present

## 2017-03-31 DIAGNOSIS — I4892 Unspecified atrial flutter: Secondary | ICD-10-CM

## 2017-03-31 DIAGNOSIS — I480 Paroxysmal atrial fibrillation: Secondary | ICD-10-CM | POA: Diagnosis not present

## 2017-03-31 DIAGNOSIS — Z87891 Personal history of nicotine dependence: Secondary | ICD-10-CM | POA: Diagnosis not present

## 2017-03-31 DIAGNOSIS — I442 Atrioventricular block, complete: Secondary | ICD-10-CM | POA: Diagnosis not present

## 2017-03-31 HISTORY — PX: CARDIOVERSION: SHX1299

## 2017-03-31 SURGERY — CARDIOVERSION
Anesthesia: General

## 2017-03-31 MED ORDER — SODIUM CHLORIDE 0.9 % IV SOLN
INTRAVENOUS | Status: DC
  Administered 2017-03-31: 10:00:00 via INTRAVENOUS

## 2017-03-31 MED ORDER — PROPOFOL 10 MG/ML IV BOLUS
INTRAVENOUS | Status: DC | PRN
  Administered 2017-03-31: 70 mg via INTRAVENOUS

## 2017-03-31 MED ORDER — LIDOCAINE 2% (20 MG/ML) 5 ML SYRINGE
INTRAMUSCULAR | Status: DC | PRN
  Administered 2017-03-31: 100 mg via INTRAVENOUS

## 2017-03-31 NOTE — Anesthesia Procedure Notes (Signed)
Procedure Name: General with mask airway Date/Time: 03/31/2017 12:03 PM Performed by: Rachel MouldsLee, Dyshaun Bonzo B, CRNA Pre-anesthesia Checklist: Patient identified, Emergency Drugs available, Suction available, Patient being monitored and Timeout performed Patient Re-evaluated:Patient Re-evaluated prior to induction Oxygen Delivery Method: Ambu bag Preoxygenation: Pre-oxygenation with 100% oxygen Induction Type: IV induction Ventilation: Mask ventilation without difficulty Placement Confirmation: CO2 detector,  breath sounds checked- equal and bilateral and positive ETCO2 Dental Injury: Teeth and Oropharynx as per pre-operative assessment

## 2017-03-31 NOTE — CV Procedure (Signed)
    Cardioversion Note  Vincent KeyHenry Barrett [id number] 11/03/1925  Procedure: DC Cardioversion Indications: atrial flutter  Procedure Details Consent: Obtained Time Out: Verified patient identification, verified procedure, site/side was marked, verified correct patient position, special equipment/implants available, Radiology Safety Procedures followed,  medications/allergies/relevent history reviewed, required imaging and test results available.  Performed  The patient has been on adequate anticoagulation.  The patient received IV propofol administered by anesthesia staff for sedation.  Synchronous cardioversion was performed at 120 joules.  The cardioversion was successful, the patient cardioverted into type 1 2nd degree AVB (Wenckebach periods).   Complications: No apparent complications Patient did tolerate procedure well.   Tobias AlexanderKatarina Carrolyn Hilmes, MD, [facility] 03/31/2017, 12:50 PM

## 2017-03-31 NOTE — Anesthesia Preprocedure Evaluation (Addendum)
Anesthesia Evaluation  Patient identified by MRN, date of birth, ID band Patient awake    Reviewed: Allergy & Precautions, NPO status , Patient's Chart, lab work & pertinent test results  Airway Mallampati: II  TM Distance: >3 FB Neck ROM: Full    Dental  (+) Dental Advisory Given, Upper Dentures, Lower Dentures   Pulmonary COPD, former smoker,    Pulmonary exam normal        Cardiovascular hypertension, Pt. on medications and Pt. on home beta blockers Normal cardiovascular exam+ dysrhythmias Atrial Fibrillation      Neuro/Psych    GI/Hepatic   Endo/Other    Renal/GU      Musculoskeletal  (+) Arthritis ,   Abdominal   Peds  Hematology   Anesthesia Other Findings   Reproductive/Obstetrics                            Anesthesia Physical Anesthesia Plan  ASA: III  Anesthesia Plan: General   Post-op Pain Management:    Induction: Intravenous  PONV Risk Score and Plan: Treatment may vary due to age or medical condition  Airway Management Planned: Mask  Additional Equipment:   Intra-op Plan:   Post-operative Plan:   Informed Consent: I have reviewed the patients History and Physical, chart, labs and discussed the procedure including the risks, benefits and alternatives for the proposed anesthesia with the patient or authorized representative who has indicated his/her understanding and acceptance.   Dental advisory given  Plan Discussed with: Surgeon and CRNA  Anesthesia Plan Comments:        Anesthesia Quick Evaluation

## 2017-03-31 NOTE — Anesthesia Postprocedure Evaluation (Signed)
Anesthesia Post Note  Patient: Vincent Barrett  Procedure(s) Performed: CARDIOVERSION (N/A )     Patient location during evaluation: PACU Anesthesia Type: General Level of consciousness: awake and alert Pain management: pain level controlled Vital Signs Assessment: post-procedure vital signs reviewed and stable Respiratory status: spontaneous breathing, nonlabored ventilation, respiratory function stable and patient connected to nasal cannula oxygen Cardiovascular status: blood pressure returned to baseline and stable Postop Assessment: no apparent nausea or vomiting Anesthetic complications: no    Last Vitals:  Vitals:   03/31/17 1215 03/31/17 1230  BP: (!) 117/51 (!) 155/55  Pulse: (!) 52   Resp: 18   Temp:    SpO2: 97%     Last Pain:  Vitals:   03/31/17 0930  TempSrc: Oral                 Montez Stryker DAVID

## 2017-03-31 NOTE — Transfer of Care (Signed)
Immediate Anesthesia Transfer of Care Note  Patient: Vincent Barrett  Procedure(s) Performed: CARDIOVERSION (N/A )  Patient Location: Endoscopy Unit  Anesthesia Type:General  Level of Consciousness: awake and alert   Airway & Oxygen Therapy: Patient Spontanous Breathing and Patient connected to nasal cannula oxygen  Post-op Assessment: Report given to RN and Post -op Vital signs reviewed and stable  Post vital signs: Reviewed and stable  Last Vitals:  Vitals:   03/31/17 0930  BP: (!) 136/46  Pulse: (!) 58  Resp: 20  Temp: 37.1 C  SpO2: 100%    Last Pain:  Vitals:   03/31/17 0930  TempSrc: Oral         Complications: No apparent anesthesia complications

## 2017-03-31 NOTE — Interval H&P Note (Signed)
History and Physical Interval Note:  03/31/2017 8:51 AM  Vincent Barrett  has presented today for surgery, with the diagnosis of afib  The various methods of treatment have been discussed with the patient and family. After consideration of risks, benefits and other options for treatment, the patient has consented to  Procedure(s): CARDIOVERSION (N/A) as a surgical intervention .  The patient's history has been reviewed, patient examined, no change in status, stable for surgery.  I have reviewed the patient's chart and labs.  Questions were answered to the patient's satisfaction.     Tobias AlexanderKatarina Manuel Lawhead

## 2017-03-31 NOTE — Discharge Instructions (Signed)
Electrical Cardioversion, Care After °This sheet gives you information about how to care for yourself after your procedure. Your health care provider may also give you more specific instructions. If you have problems or questions, contact your health care provider. °What can I expect after the procedure? °After the procedure, it is common to have: °· Some redness on the skin where the shocks were given. ° °Follow these instructions at home: °· Do not drive for 24 hours if you were given a medicine to help you relax (sedative). °· Take over-the-counter and prescription medicines only as told by your health care provider. °· Ask your health care provider how to check your pulse. Check it often. °· Rest for 48 hours after the procedure or as told by your health care provider. °· Avoid or limit your caffeine use as told by your health care provider. °Contact a health care provider if: °· You feel like your heart is beating too quickly or your pulse is not regular. °· You have a serious muscle cramp that does not go away. °Get help right away if: °· You have discomfort in your chest. °· You are dizzy or you feel faint. °· You have trouble breathing or you are short of breath. °· Your speech is slurred. °· You have trouble moving an arm or leg on one side of your body. °· Your fingers or toes turn cold or blue. °This information is not intended to replace advice given to you by your health care provider. Make sure you discuss any questions you have with your health care provider. °Document Released: 11/17/2012 Document Revised: 08/31/2015 Document Reviewed: 08/03/2015 °Elsevier Interactive Patient Education © 2018 Elsevier Inc. ° °

## 2017-04-03 ENCOUNTER — Other Ambulatory Visit: Payer: Self-pay

## 2017-04-03 MED ORDER — LOSARTAN POTASSIUM-HCTZ 100-25 MG PO TABS: 1.0000 | ORAL_TABLET | Freq: Every day | ORAL | 3 refills | Status: DC

## 2017-04-13 ENCOUNTER — Other Ambulatory Visit: Payer: Self-pay | Admitting: Internal Medicine

## 2017-04-13 NOTE — Telephone Encounter (Signed)
refill 

## 2017-04-15 ENCOUNTER — Ambulatory Visit (INDEPENDENT_AMBULATORY_CARE_PROVIDER_SITE_OTHER): Payer: Medicare Other | Admitting: Adult Health

## 2017-04-15 ENCOUNTER — Encounter: Payer: Self-pay | Admitting: Adult Health

## 2017-04-15 VITALS — BP 120/62 | HR 76 | Ht 65.0 in | Wt 166.0 lb

## 2017-04-15 DIAGNOSIS — I10 Essential (primary) hypertension: Secondary | ICD-10-CM

## 2017-04-15 DIAGNOSIS — I4891 Unspecified atrial fibrillation: Secondary | ICD-10-CM

## 2017-04-15 NOTE — Progress Notes (Signed)
Cardiology Office Note   Date:  04/15/2017   ID:  Vincent Barrett, DOB [birth year], MRN [id number]  PCP:  [name], MD  Cardiologist:  Dr.Hilty    Chief Complaint  Patient presents with  . Atrial Fibrillation  . Hypertension     History of Present Illness: Vincent Barrett is a 84 y.o. male who presents for ongoing assessment and management of paroxysmal atrial fibrillation, hypertension, (some past evidence of whitecoat syndrome), the patient was last seen by Dr. Rennis Golden in the office on 03/25/2017 on follow-up for atrial flutter.  He was found to be persistently in atrial flutter with slow ventricular response with a heart rate of 49 bpm.  He is on metoprolol 25 mg daily.    Cardioversion was discussed with the patient as he is becoming more symptomatic with shortness of breath and fatigue.  He remained on Eliquis twice daily.  The patient agreed to have outpatient cardioversion and held metoprolol day of procedure as he was in slow ventricular response.  The patient underwent cardioversion on 03/31/2017 by Dr. Tobias Alexander which was successful, converting him to type I second-degree AV block vs [place]rape.).  The patient comes today feeling well, does not have any complaints of bleeding on ELIQUIS, dizzy spells, dyspnea, feeling his heart racing. Ever, EKG completed the office today reveals atrial flutter with variable conduction 3-1 most prominently.   Past Medical History:  Diagnosis Date  . BPH (benign prostatic hyperplasia)   . COPD (chronic obstructive pulmonary disease) (HCC)   . Hypertension   . OA (osteoarthritis)   . PAF (paroxysmal atrial fibrillation) (HCC)     Past Surgical History:  Procedure Laterality Date  . CARDIOVERSION N/A 03/31/2017   Procedure: CARDIOVERSION;  Surgeon: Lars Masson, MD;  Location: [facility] ENDOSCOPY;  Service: Cardiovascular;  Laterality: N/A;  . CARPAL TUNNEL RELEASE  02/2010   Dr. Franky Macho  . NM MYOCAR PERF WALL MOTION  08/08/2011   lexiscan;  normal pattern of perfusion in all regions; post-stress EF 69%; low risk scan   . TRANSTHORACIC ECHOCARDIOGRAM  2013   EF 52%, mild AV leaflet thickening; trace TR     Current Outpatient Medications  Medication Sig Dispense Refill  . amLODipine (NORVASC) 2.5 MG tablet Take 1 tablet (2.5 mg total) by mouth daily. 90 tablet 3  . apixaban (ELIQUIS) 5 MG TABS tablet Take 1 tablet (5 mg total) by mouth 2 (two) times daily. 60 tablet 5  . doxazosin (CARDURA) 4 MG tablet Take 1 tablet (4 mg total) by mouth daily. KEEP OV. 90 tablet 0  . Hypromellose (ARTIFICIAL TEARS OP) Place 1 drop into both eyes daily as needed (eye pain).    Marland Kitchen losartan-hydrochlorothiazide (HYZAAR) 100-25 MG tablet Take 1 tablet by mouth daily. 90 tablet 3  . metoprolol succinate (TOPROL-XL) 25 MG 24 hr tablet TAKE 1 TABLET (25 MG TOTAL) BY MOUTH DAILY. 90 tablet 2   No current facility-administered medications for this visit.     Allergies:   Quinine    Social History:  The patient  reports that he quit smoking about 54 years ago. he has never used smokeless tobacco. He reports that he does not drink alcohol or use drugs.   Family History:  The patient's family history is not on file.    ROS: All other systems are reviewed and negative. Unless otherwise mentioned in H&P    PHYSICAL EXAM: VS:  Ht 5\' 5"  (1.651 m)   Wt 166 lb (75.3 kg)  BMI 27.62 kg/m  , BMI Body mass index is 27.62 kg/m. GEN: Well nourished, well developed, in no acute distress  HEENT: normal  Neck: no JVD, carotid bruits, or masses Cardiac: RRR; no murmurs, rubs, or gallops,no edema  Respiratory:  clear to auscultation bilaterally, normal work of breathing GI: soft, nontender, nondistended, + BS MS: no deformity or atrophy  Skin: warm and dry, no rash Neuro:  Strength and sensation are intact Psych: euthymic mood, full affect   EKG: Atrial flutter, 41 A-V conduction, heart rate of 76 bpm. Left anterior fascicular block is also  noted.  Recent Labs: 02/20/2017: TSH 5.170 03/23/2017: BUN 19; Creatinine, Ser 1.48; Hemoglobin 13.1; Platelets 213; Potassium 4.3; Sodium 138    Lipid Panel No results found for: CHOL, TRIG, HDL, CHOLHDL, VLDL, LDLCALC, LDLDIRECT    Wt Readings from Last 3 Encounters:  04/15/17 166 lb (75.3 kg)  03/23/17 166 lb (75.3 kg)  02/20/17 164 lb (74.4 kg)   ASSESSMENT AND PLAN:  1. Atrial flutter: Status post cardioversion, at the time it was successful but today here in the office he is returned atrial flutter with variable conduction 3-1 and 4:1. The patient does not wish to have any further attempts at cardioversion and would like to just continue rate control and anticoagulation therapy.  I've advised him and he has episodes where his heart rate is racing, oriented and shortness of breath or dizziness, he should be evaluated to hear his heart rate has increased significantly. He verbalizes understanding. For now no medication changes. Samples of  ELIQUIS are provided.  2. Hypertension: Excellent control at this time. No changes in his regimen. No planned labs as labs were completed recently prior to cardioversion.  Current medicines are reviewed at length with the patient today.    Labs/ tests ordered today include: none Bettey MareKathryn M. Liborio NixonLawrence DNP, ANP, AACC   04/15/2017 1:30 PM    [facility] [address], [place], [place] [address] Phone: [phone]; Fax: [phone]

## 2017-04-15 NOTE — Patient Instructions (Signed)
Medication Instructions:  NO CHANGES- Your physician recommends that you continue on your current medications as directed. Please refer to the Current Medication list given to you today.  If you need a refill on your cardiac medications before your next appointment, please call your pharmacy.   Follow-Up: Your physician wants you to follow-up in: 6 months with Dr Rennis GoldenHilty You should receive a reminder letter in the mail two months in advance. If you do not receive a letter, please call our office 08-2017  to schedule the 10-2017 follow-up appointment.   Thank you for choosing [facility]!!

## 2017-08-11 ENCOUNTER — Other Ambulatory Visit: Payer: Self-pay | Admitting: Internal Medicine

## 2017-09-03 ENCOUNTER — Ambulatory Visit (INDEPENDENT_AMBULATORY_CARE_PROVIDER_SITE_OTHER)
Admission: RE | Admit: 2017-09-03 | Discharge: 2017-09-03 | Disposition: A | Discharge status: Home / Self Care | Payer: Medicare Other | Source: Ambulatory Visit | Attending: Internal Medicine | Admitting: Internal Medicine

## 2017-09-03 ENCOUNTER — Ambulatory Visit (INDEPENDENT_AMBULATORY_CARE_PROVIDER_SITE_OTHER): Payer: Medicare Other | Admitting: Internal Medicine

## 2017-09-03 ENCOUNTER — Encounter: Payer: Self-pay | Admitting: Internal Medicine

## 2017-09-03 ENCOUNTER — Other Ambulatory Visit (INDEPENDENT_AMBULATORY_CARE_PROVIDER_SITE_OTHER): Payer: Medicare Other

## 2017-09-03 VITALS — BP 140/70 | HR 57 | Ht 64.5 in | Wt 163.0 lb

## 2017-09-03 DIAGNOSIS — R911 Solitary pulmonary nodule: Secondary | ICD-10-CM | POA: Diagnosis not present

## 2017-09-03 DIAGNOSIS — R05 Cough: Secondary | ICD-10-CM | POA: Insufficient documentation

## 2017-09-03 DIAGNOSIS — R058 Other specified cough: Secondary | ICD-10-CM

## 2017-09-03 LAB — CBC WITH DIFFERENTIAL/PLATELET
BASOS ABS: 0.1 10*3/uL (ref 0.0–0.1)
Basophils Relative: 0.6 % (ref 0.0–3.0)
EOS ABS: 0.1 10*3/uL (ref 0.0–0.7)
Eosinophils Relative: 1.2 % (ref 0.0–5.0)
HEMATOCRIT: 41.5 % (ref 39.0–52.0)
Hemoglobin: 14.1 g/dL (ref 13.0–17.0)
LYMPHS ABS: 1.6 10*3/uL (ref 0.7–4.0)
LYMPHS PCT: 12.5 % (ref 12.0–46.0)
MCHC: 34 g/dL (ref 30.0–36.0)
MCV: 91.5 fl (ref 78.0–100.0)
MONOS PCT: 8.8 % (ref 3.0–12.0)
Monocytes Absolute: 1.1 10*3/uL — ABNORMAL HIGH (ref 0.1–1.0)
NEUTROS ABS: 9.6 10*3/uL — AB (ref 1.4–7.7)
NEUTROS PCT: 76.9 % (ref 43.0–77.0)
PLATELETS: 207 10*3/uL (ref 150.0–400.0)
RBC: 4.54 Mil/uL (ref 4.22–5.81)
RDW: 13.3 % (ref 11.5–15.5)
WBC: 12.5 10*3/uL — ABNORMAL HIGH (ref 4.0–10.5)

## 2017-09-03 NOTE — Patient Instructions (Addendum)
For drainage / throat tickle try take CHLORPHENIRAMINE  4 mg - take one every 4 hours as needed - available over the counter- may cause drowsiness so start with just  A dose  Or two one hour before bedtime - if helps then can try to use up to one every 4 hours in the daytime as needed when you can afford to risk being drowsy  GERD (REFLUX)  is an extremely common cause of respiratory symptoms just like yours , many times with no obvious heartburn at all.    It can be treated with medication, but also with lifestyle changes including elevation of the head of your bed (ideally with 6 inch  bed blocks),  Smoking cessation, avoidance of late meals, excessive alcohol, and avoid fatty foods, chocolate, peppermint, colas, red wine, and acidic juices such as orange juice.  NO MINT OR MENTHOL PRODUCTS SO NO COUGH DROPS   USE SUGARLESS CANDY INSTEAD (Jolley ranchers or Stover's or Life Savers) or even ice chips will also do - the key is to swallow to prevent all throat clearing. NO OIL BASED VITAMINS - use powdered substitutes.  If not improving :  Try prilosec otc 20mg   Take 30-60 min before first meal of the day and Pepcid ac (famotidine) 20 mg one @  bedtime until return to office    Please remember to go to the lab and x-ray department downstairs in the basement  for your tests - we will call you with the results when they are available.  Please schedule a follow up office visit in 4 weeks, sooner if needed  with all medications /inhalers/ solutions in hand so we can verify exactly what you are taking. This includes all medications from all doctors and over the counters

## 2017-09-03 NOTE — Progress Notes (Signed)
Vincent Barrett, male    DOB: 05-Dec-1925,   MRN: [id number]    Brief patient profile: 67 yowm quit smoking in 1959 referred to pulmonary clinic 09/03/2017 by Dr  Benedetto Goad for cough and spn     History of Present Illness  09/03/2017  1st Office eval Chief Complaint  Patient presents with  . Pulmonary Consult    Referred by Dr. Benedetto Goad for eval of pulmonary nodule. He states he has had a cough for the past 10-15 years. He coughs up large amounts of clear sputum. Cough is worsened by eating and he gets choked easily.    onset of cough was around 2005 and gradually worse assoc with eating and sporadic daytime but pattern of cough occurs almost  every night esp right at hs assoc sense of pnds/  allegra didn't help, never did allergy/ ent eval  Produces lots of clear mucus/ does get choked easily but no h/o pna    No obvious day to day or daytime variability or assoc  purulent sputum or mucus plugs or hemoptysis or cp or chest tightness, subjective wheeze or overt sinus or hb symptoms.    . Also denies any obvious fluctuation of symptoms with weather or environmental changes or other aggravating or alleviating factors except as outlined above   No unusual exposure hx or h/o childhood pna/ asthma or knowledge of premature birth.  Current Allergies, Complete Past Medical History, Past Surgical History, Family History, and Social History were reviewed in [employer] record.  ROS  The following are not active complaints unless bolded Hoarseness, sore throat, dysphagia, dental problems, itching, sneezing,  nasal congestion or discharge of excess mucus or purulent secretions, ear ache,   fever, chills, sweats, unintended wt loss or wt gain, classically pleuritic or exertional cp,  orthopnea pnd or arm/hand swelling  or leg swelling, presyncope, palpitations, abdominal pain, anorexia, nausea, vomiting, diarrhea  or change in bowel habits or change in bladder habits, change in  stools or change in urine, dysuria, hematuria,  rash, arthralgias, visual complaints, headache, numbness, weakness or ataxia or problems with walking or coordination,  change in mood or  memory.              Past Medical History:  Diagnosis Date  . BPH (benign prostatic hyperplasia)   . COPD (chronic obstructive pulmonary disease) (HCC)   . Hypertension   . OA (osteoarthritis)   . PAF (paroxysmal atrial fibrillation) (HCC)     Outpatient Medications Prior to Visit  Medication Sig Dispense Refill  . amLODipine (NORVASC) 2.5 MG tablet Take 1 tablet (2.5 mg total) by mouth daily. 90 tablet 3  . Ascorbic Acid (VITAMIN C) 1000 MG tablet Take 1,000 mg by mouth daily.    Marland Kitchen doxazosin (CARDURA) 4 MG tablet Take 1 tablet (4 mg total) by mouth daily. KEEP OV. 90 tablet 0  . ELIQUIS 5 MG TABS tablet TAKE 1 TABLET BY MOUTH TWICE A DAY 180 tablet 1  . losartan-hydrochlorothiazide (HYZAAR) 100-25 MG tablet Take 1 tablet by mouth daily. 90 tablet 3  . metoprolol succinate (TOPROL-XL) 25 MG 24 hr tablet TAKE 1 TABLET (25 MG TOTAL) BY MOUTH DAILY. 90 tablet 2  . Multiple Vitamins-Minerals (PRESERVISION AREDS 2) CAPS Take 1 capsule by mouth daily.    . Hypromellose (ARTIFICIAL TEARS OP) Place 1 drop into both eyes daily as needed (eye pain).     No facility-administered medications prior to visit.  Objective:     BP 140/70 (BP Location: Left Arm, Cuff Size: Normal)   Pulse (!) 57   Ht 5' 4.5" (1.638 m)   Wt 163 lb (73.9 kg)   SpO2 100%   BMI 27.55 kg/m   SpO2: 100 % RA    Frail elderly wm, hoarse and hard of hearing     HEENT: nl   oropharynx. Nl external ear canals without cough reflex - full dentures/ moderate bilateral non-specific turbinate edema     NECK :  without JVD/Nodes/TM/ nl carotid upstrokes bilaterally   LUNGS: no acc muscle use,  Nl contour chest which is clear to A and P bilaterally without cough on insp or exp maneuvers   CV:  RRR  no s3 or  murmur or increase in P2, and no edema   ABD:  soft and nontender with nl inspiratory excursion in the supine position. No bruits or organomegaly appreciated, bowel sounds nl  MS:  Nl gait/ ext warm without deformities, calf tenderness, cyanosis or clubbing No obvious joint restrictions   SKIN: warm and dry without lesions    NEURO:  alert, approp, nl sensorium with  no motor or cerebellar deficits apparent.    CXR PA and Lateral:   09/03/2017 :    I personally reviewed images and agree with radiology impression as follows:    Nodular appearing opacity either in or overlying the left base region. Advise clinical assessment for potential overlying nevus in this area which could account for this opacity. If no such finding is evident on physical examination, noncontrast enhanced chest CT could be helpful for localization and characterization of this area.  No edema or consolidation evident. Small calcified granuloma left mid lung. My impression:  No corresponding spherical density on lateral so not clearly in the lung       Assessment   Upper airway cough syndrome Onset around 2005   Upper airway cough syndrome (previously labeled PNDS),  is so named because it's frequently impossible to sort out how much is  CR/sinusitis with freq throat clearing (which can be related to primary GERD)   vs  causing  secondary (" extra esophageal")  GERD from wide swings in gastric pressure that occur with throat clearing, often  promoting self use of mint and menthol lozenges that reduce the lower esophageal sphincter tone and exacerbate the problem further in a cyclical fashion.   These are the same pts (now being labeled as having "irritable larynx syndrome" by some cough centers) who not infrequently have a history of having failed to tolerate ace inhibitors,  dry powder inhalers or biphosphonates or report having atypical/extraesophageal reflux symptoms that don't respond to standard doses of PPI   and are easily confused as having aecopd or asthma flares by even experienced allergists/ pulmonologists (myself included).    Of the three most common causes of  Sub-acute / recurrent or chronic cough, only one (GERD)  can actually contribute to/ trigger  the other two (asthma and post nasal drip syndrome)  and perpetuate the cylce of cough.  While not intuitively obvious, many patients with chronic low grade reflux do not cough until there is a primary insult that disturbs the protective epithelial barrier and exposes sensitive nerve endings.   This is typically viral but can due to PNDS and  either may apply here.      The point is that once this occurs, it is difficult to eliminate the cycle  using anything but a maximally  effective acid suppression regimen at least in the short run, accompanied by an appropriate diet to address non acid GERD and control / eliminate all pnds with 1st gen H1 blockers per guidelines  Then proceed with ST eval/ DgEs next if not improving on this rx.       Solitary pulmonary nodule 1st noted 12/06/2016   Note this is near where he had rib fx but not clearly a callus so needs f/u but he's way too frail to consider aggressive rx at this point so will arrange f/u with nipple markers when returns in 4 weeks  Discussed in detail all the  indications, usual  risks and alternatives  relative to the benefits with patient who agrees to proceed with conservative f/u as outlined     Total time devoted to counseling  > 50 % of initial 60 min office visit:  review case with pt/ discussion of options/alternatives/ personally creating written customized instructions  in presence of pt  then going over those specific  Instructions directly with the pt including how to use all of the meds but in particular covering each new medication in detail and the difference between the maintenance= "automatic" meds and the prns using an action plan format for the latter (If this  problem/symptom => do that organization reading Left to right).  Please see AVS from this visit for a full list of these instructions which I personally wrote for this pt and  are unique to this visit.      Sandrea HughsMichael Wert, MD 09/03/2017

## 2017-09-04 ENCOUNTER — Encounter: Payer: Self-pay | Admitting: Internal Medicine

## 2017-09-04 DIAGNOSIS — R911 Solitary pulmonary nodule: Secondary | ICD-10-CM | POA: Insufficient documentation

## 2017-09-04 LAB — RESPIRATORY ALLERGY PROFILE REGION II ~~LOC~~
Allergen, Cedar tree, t12: <0.1 kU/L
Allergen, Cottonwood, t14: <0.1 kU/L
Allergen, Mouse Urine Protein, e78: <0.1 kU/L
Allergen, Oak,t7: <0.1 kU/L
Allergen, P. notatum, m1: <0.1 kU/L
Aspergillus fumigatus, m3: <0.1 kU/L
Bermuda Grass: <0.1 kU/L
Box Elder IgE: <0.1 kU/L
CLADOSPORIUM HERBARUM (M2) IGE: <0.1 kU/L
CLASS: 0
CLASS: 0
CLASS: 0
CLASS: 0
CLASS: 0
CLASS: 0
CLASS: 0
CLASS: 0
CLASS: 0
CLASS: 0
CLASS: 0
CLASS: 0
COCKROACH: 0.1 kU/L — AB
Class: 0
Class: 0
Class: 0
Class: 0
Class: 0
Class: 0
Class: 0
Class: 0
Class: 0
Class: 0
Class: 0
Class: 0
D. farinae: <0.1 kU/L
Dog Dander: <0.1 kU/L
Elm IgE: <0.1 kU/L
IgE (Immunoglobulin E), Serum: 1164 kU/L — ABNORMAL HIGH (ref <=114)
Johnson Grass: <0.1 kU/L
Rough Pigweed  IgE: <0.1 kU/L
Sheep Sorrel IgE: <0.1 kU/L

## 2017-09-04 LAB — INTERPRETATION:

## 2017-09-04 NOTE — Assessment & Plan Note (Signed)
Onset around 2005   Upper airway cough syndrome (previously labeled PNDS),  is so named because it's frequently impossible to sort out how much is  CR/sinusitis with freq throat clearing (which can be related to primary GERD)   vs  causing  secondary (" extra esophageal")  GERD from wide swings in gastric pressure that occur with throat clearing, often  promoting self use of mint and menthol lozenges that reduce the lower esophageal sphincter tone and exacerbate the problem further in a cyclical fashion.   These are the same pts (now being labeled as having "irritable larynx syndrome" by some cough centers) who not infrequently have a history of having failed to tolerate ace inhibitors,  dry powder inhalers or biphosphonates or report having atypical/extraesophageal reflux symptoms that don't respond to standard doses of PPI  and are easily confused as having aecopd or asthma flares by even experienced allergists/ pulmonologists (myself included).    Of the three most common causes of  Sub-acute / recurrent or chronic cough, only one (GERD)  can actually contribute to/ trigger  the other two (asthma and post nasal drip syndrome)  and perpetuate the cylce of cough.  While not intuitively obvious, many patients with chronic low grade reflux do not cough until there is a primary insult that disturbs the protective epithelial barrier and exposes sensitive nerve endings.   This is typically viral but can due to PNDS and  either may apply here.      The point is that once this occurs, it is difficult to eliminate the cycle  using anything but a maximally effective acid suppression regimen at least in the short run, accompanied by an appropriate diet to address non acid GERD and control / eliminate all pnds with 1st gen H1 blockers per guidelines  Then proceed with ST eval/ DgEs next if not improving on this rx.

## 2017-09-04 NOTE — Assessment & Plan Note (Signed)
1st noted 12/06/2016   Note this is near where he had rib fx but not clearly a callus so needs f/u but he's way too frail to consider aggressive rx at this point so will arrange f/u with nipple markers when returns in 4 weeks  Discussed in detail all the  indications, usual  risks and alternatives  relative to the benefits with patient who agrees to proceed with conservative f/u as outlined     Total time devoted to counseling  > 50 % of initial 60 min office visit:  review case with pt/ discussion of options/alternatives/ personally creating written customized instructions  in presence of pt  then going over those specific  Instructions directly with the pt including how to use all of the meds but in particular covering each new medication in detail and the difference between the maintenance= "automatic" meds and the prns using an action plan format for the latter (If this problem/symptom => do that organization reading Left to right).  Please see AVS from this visit for a full list of these instructions which I personally wrote for this pt and  are unique to this visit.

## 2017-09-04 NOTE — Progress Notes (Signed)
Spoke with pt's spouse and notified of results/recs 

## 2017-10-06 ENCOUNTER — Ambulatory Visit (INDEPENDENT_AMBULATORY_CARE_PROVIDER_SITE_OTHER): Payer: Medicare Other | Admitting: Internal Medicine

## 2017-10-06 ENCOUNTER — Encounter: Payer: Self-pay | Admitting: Internal Medicine

## 2017-10-06 ENCOUNTER — Ambulatory Visit (INDEPENDENT_AMBULATORY_CARE_PROVIDER_SITE_OTHER)
Admission: RE | Admit: 2017-10-06 | Discharge: 2017-10-06 | Disposition: A | Discharge status: Home / Self Care | Payer: Medicare Other | Source: Ambulatory Visit | Attending: Internal Medicine | Admitting: Internal Medicine

## 2017-10-06 VITALS — BP 120/64 | HR 68 | Ht 64.5 in | Wt 162.6 lb

## 2017-10-06 DIAGNOSIS — R911 Solitary pulmonary nodule: Secondary | ICD-10-CM | POA: Diagnosis not present

## 2017-10-06 DIAGNOSIS — R058 Other specified cough: Secondary | ICD-10-CM

## 2017-10-06 DIAGNOSIS — R05 Cough: Secondary | ICD-10-CM

## 2017-10-06 NOTE — Patient Instructions (Addendum)
Please remember to go to the  x-ray department downstairs in the basement  for your tests - we will call you with the results when they are available.      Pulmonary will be arrange according to xray results but we can always see you back here  anytime for new respiratory symtpoms  Add : f/u ov 3 months

## 2017-10-06 NOTE — Assessment & Plan Note (Signed)
Onset around 2005  - Allergy profile  09/03/17  >  Eos 0.1 /  IgE  1164  Pos dust only  - rx 1st gen H1 blockers per guidelines  Starting 09/03/17 > resolved 10/06/2017   Despite marked elevation in IgE his noct cough has responded to a most affordable H1 that he is tolerating well c/w UACS and not asthma or any lower airways dz > rec no change rx .

## 2017-10-06 NOTE — Assessment & Plan Note (Signed)
1st noted 12/06/16  And still feel this is probably a rib lesion and not a lung mass but pt and fm very reluctant to approach aggressively since asymptomatic and not a candidate for surgery and not interested in pursuing with CT -  I'm ok with that as long as no symptoms  Discussed in detail all the  indications, usual  risks and alternatives  relative to the benefits with patient who agrees to proceed with conservative f/u as outlined with cxr / ov in 3 months, sooner if new symptoms   I had an extended discussion with the patient and fm  reviewing all relevant studies completed to date and  lasting 15 to 20 minutes of a 25 minute visit    Each maintenance medication was reviewed in detail including most importantly the difference between maintenance and prns and under what circumstances the prns are to be triggered using an action plan format that is not reflected in the computer generated alphabetically organized AVS.     Please see AVS for specific instructions unique to this visit that I personally wrote and verbalized to the the pt in detail and then reviewed with pt  by my nurse highlighting any  changes in therapy recommended at today's visit to their plan of care.

## 2017-10-06 NOTE — Progress Notes (Signed)
Vincent Barrett, male    DOB: 08/17/1925,   MRN: [id number]    Brief patient profile: 89  yowm quit smoking in 1959 referred to pulmonary clinic 09/03/2017 by Dr  Benedetto GoadFred wilson for cough and spn     History of Present Illness  09/03/2017  1st Office eval Chief Complaint  Patient presents with  . Pulmonary Consult    Referred by Dr. Benedetto GoadFred Wilson for eval of pulmonary nodule. He states he has had a cough for the past 10-15 years. He coughs up large amounts of clear sputum. Cough is worsened by eating and he gets choked easily.   onset of cough was around 2005 and gradually worse assoc with eating and sporadic daytime but pattern of cough occurs almost  every night esp right at hs assoc sense of pnds/  allegra didn't help, never did allergy/ ent eval  Produces lots of clear mucus/ does get choked easily but no h/o pna  rec For drainage / throat tickle try take CHLORPHENIRAMINE  4 mg - take one every 4 hours as needed GERD diet  If not improving :  Try prilosec otc 20mg   Take 30-60 min before first meal of the day and Pepcid ac (famotidine) 20 mg one @  bedtime until return to office  Please schedule a follow up office visit in 4 weeks, sooner if needed  with all medications /inhalers/ solutions in hand so we can verify exactly what you are taking. This includes all medications from all doctors and over the counters    10/06/2017  f/u ov/Hatcher Froning re:  F/u cough Did not bring all meds  Chief Complaint  Patient presents with  . Follow-up    Cough has improved slightly.    Dyspnea:  Not limited by breathing from desired activities  But very sedentary Cough: hs cough resolved on h1 Sleeping: fine SABA use: none 02: no     No obvious day to day or daytime variability or assoc excess/ purulent sputum or mucus plugs or hemoptysis or cp or chest tightness, subjective wheeze or overt sinus or hb symptoms.   Sleeping as above  without nocturnal  or early am exacerbation  of respiratory  c/o's or need for  noct saba. Also denies any obvious fluctuation of symptoms with weather or environmental changes or other aggravating or alleviating factors except as outlined above   No unusual exposure hx or h/o childhood pna/ asthma or knowledge of premature birth.  Current Allergies, Complete Past Medical History, Past Surgical History, Family History, and Social History were reviewed in [facility] electronic medical record.  ROS  The following are not active complaints unless bolded Hoarseness, sore throat, dysphagia, dental problems, itching, sneezing,  nasal congestion or discharge of excess mucus or purulent secretions, ear ache,   fever, chills, sweats, unintended wt loss or wt gain, classically pleuritic or exertional cp,  orthopnea pnd or arm/hand swelling  or leg swelling, presyncope, palpitations, abdominal pain, anorexia, nausea, vomiting, diarrhea  or change in bowel habits or change in bladder habits, change in stools or change in urine, dysuria, hematuria,  rash, arthralgias, visual complaints, headache, numbness, weakness or ataxia or problems with walking or coordination,  change in mood or  memory.        Current Meds  Medication Sig  . amLODipine (NORVASC) 2.5 MG tablet Take 1 tablet (2.5 mg total) by mouth daily.  . Ascorbic Acid (VITAMIN C) 1000 MG tablet Take 1,000 mg by mouth daily.  . chlorpheniramine (  CHLOR-TRIMETON) 4 MG tablet Take 4 mg by mouth every 4 (four) hours as needed for allergies.  Marland Kitchen doxazosin (CARDURA) 4 MG tablet Take 1 tablet (4 mg total) by mouth daily. KEEP OV.  Marland Kitchen ELIQUIS 5 MG TABS tablet TAKE 1 TABLET BY MOUTH TWICE A DAY  . losartan-hydrochlorothiazide (HYZAAR) 100-25 MG tablet Take 1 tablet by mouth daily.  . metoprolol succinate (TOPROL-XL) 25 MG 24 hr tablet TAKE 1 TABLET (25 MG TOTAL) BY MOUTH DAILY.  . Multiple Vitamins-Minerals (PRESERVISION AREDS 2) CAPS Take 1 capsule by mouth daily.                 Objective:    amb wm nad looks < stated  age  Wt Readings from Last 3 Encounters:  10/06/17 162 lb 9.6 oz (73.8 kg)  09/03/17 163 lb (73.9 kg)  04/15/17 166 lb (75.3 kg)     Vital signs reviewed - Note on arrival 02 sats  97% on  RA           HEENT:full dentures, mild non specific swelling bilateral turbinates  and nl oropharynx. Nl external ear canals without cough reflex   NECK :  without JVD/Nodes/TM/ nl carotid upstrokes bilaterally   LUNGS: no acc muscle use,  Nl contour chest which is clear to A and P bilaterally without cough on insp or exp maneuvers   CV:  RRR  no s3 or murmur or increase in P2, and no edema   ABD:  soft and nontender with nl inspiratory excursion in the supine position. No bruits or organomegaly appreciated, bowel sounds nl  MS:  Nl gait/ ext warm without deformities, calf tenderness, cyanosis or clubbing No obvious joint restrictions   SKIN: warm and dry without lesions    NEURO:  alert, approp, nl sensorium with  no motor or cerebellar deficits apparent.     CXR PA and Lateral:   10/06/2017 :    I personally reviewed images and agree with radiology impression as follows:   Abnormal pleural based density on the left inferior laterally similar to that seen on the study of September 03, 2017.         Assessment

## 2017-10-07 NOTE — Progress Notes (Signed)
LMTCB

## 2017-10-08 ENCOUNTER — Telehealth: Payer: Self-pay | Admitting: Internal Medicine

## 2017-10-08 NOTE — Telephone Encounter (Signed)
Called and spoke to patient's wife. Relayed results per Dr. Sherene SiresWert.   Notes recorded by Nyoka CowdenWert, Michael B, MD on 10/07/2017 at 8:38 AM EDT Call pt: Reviewed cxr and no acute change so no change in recommendations made at ov - we need to see in 3 months for f/u cxr, call sooner if new resp /chest symptoms   Scheduled the follow up visit for 12/23/17 at 2:45. They are aware that patient will need repeat chest xray at that time.

## 2017-11-24 ENCOUNTER — Ambulatory Visit (INDEPENDENT_AMBULATORY_CARE_PROVIDER_SITE_OTHER): Payer: Medicare Other | Admitting: Internal Medicine

## 2017-11-24 ENCOUNTER — Encounter: Payer: Self-pay | Admitting: Internal Medicine

## 2017-11-24 VITALS — BP 130/70 | HR 66 | Ht 65.0 in | Wt 165.8 lb

## 2017-11-24 DIAGNOSIS — I10 Essential (primary) hypertension: Secondary | ICD-10-CM | POA: Diagnosis not present

## 2017-11-24 DIAGNOSIS — I4892 Unspecified atrial flutter: Secondary | ICD-10-CM | POA: Diagnosis not present

## 2017-11-24 DIAGNOSIS — Z7901 Long term (current) use of anticoagulants: Secondary | ICD-10-CM

## 2017-11-24 NOTE — Patient Instructions (Addendum)
Medication Instructions:  Continue current medications  * 4 boxes eliquis samples provided If you need a refill on your cardiac medications before your next appointment, please call your pharmacy.    Follow-Up: At [facility], you and your health needs are our priority.  As part of our continuing mission to provide you with exceptional heart care, we have created designated Provider Care Teams.  These Care Teams include your primary Cardiologist (physician) and Advanced Practice Providers (APPs -  Physician Assistants and Nurse Practitioners) who all work together to provide you with the care you need, when you need it. You will need a follow up appointment in 6 months.  Please call our office 2 months in advance to schedule this appointment.  You may see Chrystie Nose, MD or one of the following Advanced Practice Providers on your designated Care Team: [place], [place] . Micah Flesher, PA-C  Any Other Special Instructions Will Be Listed Below (If Applicable).

## 2017-11-24 NOTE — Progress Notes (Signed)
OFFICE NOTE  Chief Complaint:  Follow-up atrial flutter  Primary Care Physician: Barbie BannerWilson, Fred H, MD  HPI:  Vincent Barrett is a pleasant 84 year old male previously followed by Dr. Alanda Barrett who is here today to establish cardiac care with me. His past medical history is significant for an episode of paroxysmal atrial fibrillation in June of 2013. He converted spontaneously back to sinus rhythm and seems to maintain that. Based on the relative infrequency of his episodes, he has been maintained on aspirin. He has as history of hypertension which is fairly well-controlled although he does have an element of whitecoat hypertension. He was a former smoker but quit over 50 years ago. He had a negative Myoview for ischemia in June of 2013.  He denies any chest pain or worsening shortness of breath.  Mr. Vincent Barrett returns today for follow-up. He denies any chest pain or worsening shortness of breath. He continues to have lower leg swelling. This may be related to the Norvasc although recent decreases in his Norvasc has not improved the swelling. He is on low-dose Hyzaar. He reports no recurrent atrial fibrillation. Is not clear whether he actually had A. fib in the past or perhaps very frequent PACs. Either way the burden of his A. fib is very low and therefore aspirin therapy is reasonable.  I saw Mr. Vincent Barrett back today in the office for follow-up. Unfortunately he fell on the snow and ice in his driveway on Saturday. He was seen in the [facility] and given some tramadol for pain. This seems to be not holding him very well. I defer to his primary care provider as he may need a little stronger pain medicine. He is using a pillow to splint his chest. Workup revealed some broken ribs and he had some soft tissue head trauma but little concern for significant head injury. Fortunately he is only on low-dose aspirin. Blood pressure surprisingly is well-controlled on his current medicines. He denies any chest pain or  palpitations or shortness of breath prior to his fall and of course now has some pleuritic chest wall pain secondary to his rib injury.  02/21/2016  Mr. Vincent Barrett returns for follow-up. Over the past year he has no new complaints. He denies any chest pain or worsening shortness of breath. He occasionally gets some swelling in his right ankle. He was advised to wear compression stockings but is been hesitant to do that. Blood pressure is excellent at 118/62. He rarely takes aspirin, more likely as needed for headaches. EKG shows sinus rhythm with first-degree AV block at 65.  02/20/2017  Mr. Vincent Barrett was seen today in follow-up.  He is again without any significant complaints.  An EKG today however shows that he is in atrial flutter with a slow ventricular response and heart rate of 49.  A flutter rate appears to be fairly quick around 300 beats a minute.  When asked about whether he is symptomatic with this he really did not describe any symptoms although his wife is noted over the past several months that he has had decreased energy level and seems to want to go back to bed later in the morning after waking up.  He denies any chest pain or worsening shortness of breath.  He was previously on aspirin for a remote history of atrial fibrillation however says he does not take it regularly.  Based on age history of hypertension his CHADSVASC score is 3.  03/25/2017  Mr. Vincent Barrett returns today for follow-up of  atrial flutter.  He is persistently in atrial flutter with slow ventricular response and heart rate of 49.  He is on metoprolol succinate 25 mg daily.  Despite the slow ventricular response he is asymptomatic.  We again discussed the possibility of cardioversion.  He is not sure if he would benefit from it but his wife noted that he has been more fatigued.  Her concern is that she also had atrial fibrillation and did not respond to cardioversion.  That being said, he may very well respond to this treatment.  I  suspect has been in flutter since he had a trauma with fall in the fall of last year.  Reports good compliance with his Eliquis twice daily.  11/24/2017  Mr. Vincent Barrett was seen today in follow-up.  He underwent successful cardioversion and February 2019 however ultimately returned back to atrial flutter.  He was seen in follow-up in March by Joni Reining, DNP, who felt that he was asymptomatic and recommended a rate control strategy.  Since then he is maintained good rate control over his atrial flutter and remains anticoagulated on Eliquis.  He has no specific complaints today other than some mild tingling in his left upper back which he said started after his cardioversion but is not totally gone away.  He does think it is getting better.  PMHx:  Past Medical History:  Diagnosis Date  . BPH (benign prostatic hyperplasia)   . COPD (chronic obstructive pulmonary disease) (HCC)   . Hypertension   . OA (osteoarthritis)   . PAF (paroxysmal atrial fibrillation) (HCC)     Past Surgical History:  Procedure Laterality Date  . CARDIOVERSION N/A 03/31/2017   Procedure: CARDIOVERSION;  Surgeon: Lars Masson, MD;  Location: [facility] ENDOSCOPY;  Service: Cardiovascular;  Laterality: N/A;  . CARPAL TUNNEL RELEASE  02/2010   Dr. Franky Macho  . NM MYOCAR PERF WALL MOTION  08/08/2011   lexiscan; normal pattern of perfusion in all regions; post-stress EF 69%; low risk scan   . TRANSTHORACIC ECHOCARDIOGRAM  2013   EF 52%, mild AV leaflet thickening; trace TR    FAMHx:  No family history on file.  SOCHx:   reports that he quit smoking about 60 years ago. His smoking use included cigarettes. He has a 22.00 pack-year smoking history. He has never used smokeless tobacco. He reports that he does not drink alcohol or use drugs.  ALLERGIES:  Allergies  Allergen Reactions  . Quinine Nausea And Vomiting    ROS: Pertinent items noted in HPI and remainder of comprehensive ROS otherwise negative.  HOME  MEDS: Current Outpatient Medications  Medication Sig Dispense Refill  . amLODipine (NORVASC) 2.5 MG tablet Take 1 tablet (2.5 mg total) by mouth daily. 90 tablet 3  . Ascorbic Acid (VITAMIN C) 1000 MG tablet Take 1,000 mg by mouth daily.    Marland Kitchen doxazosin (CARDURA) 4 MG tablet Take 1 tablet (4 mg total) by mouth daily. KEEP OV. 90 tablet 0  . ELIQUIS 5 MG TABS tablet TAKE 1 TABLET BY MOUTH TWICE A DAY 180 tablet 1  . losartan-hydrochlorothiazide (HYZAAR) 100-25 MG tablet Take 1 tablet by mouth daily. 90 tablet 3  . metoprolol succinate (TOPROL-XL) 25 MG 24 hr tablet TAKE 1 TABLET (25 MG TOTAL) BY MOUTH DAILY. 90 tablet 2  . Multiple Vitamins-Minerals (PRESERVISION AREDS 2) CAPS Take 1 capsule by mouth daily.     No current facility-administered medications for this visit.     LABS/IMAGING: No results found for this or  any previous visit (from the past 48 hour(s)). No results found.  VITALS: BP 130/70   Pulse 66   Ht 5\' 5"  (1.651 m)   Wt 165 lb 12.8 oz (75.2 kg)   BMI 27.59 kg/m   EXAM: General appearance: alert and no distress Neck: no carotid bruit and no JVD Lungs: clear to auscultation bilaterally Heart: regular rate and rhythm, S1, S2 normal, no murmur, click, rub or gallop Abdomen: soft, non-tender; bowel sounds normal; no masses,  no organomegaly Extremities: edema 2+ RLE and 1+ LLE Pulses: 2+ and symmetric Skin: Skin color, texture, turgor normal. No rashes or lesions Neurologic: Grossly normal Psych: Mood, affect normal  EKG: Atrial flutter with variable AV block at 66-personally reviewed  ASSESSMENT: 1. Persistent atrial flutter -failed cardioversion, rate control strategy 2. CHADSVASC score of 3 - on Eliquis 3. Hypertension-controlled 4. Remote episode of paroxysmal atrial fibrillation 5. Right leg edema  PLAN: 1.   Mr. Andal has persistent atrial flutter but failed cardioversion and seems to be asymptomatic.  We will plan to continue a rate control strategy.   His chads Vascor is 3 and on Eliquis he is doing well.  Blood pressure is well controlled.  No changes to medications today.  Follow-up in 6 months or sooner as necessary.  Chrystie Nose, MD, [facility], FACP  [place]  [facility]  Medical Director of the [facility]  Cardiovascular Risk Reduction Clinic Diplomate of the American Board of Clinical Lipidology Attending Cardiologist  Direct Dial: [phone]  Fax: [phone]  Website:  [email or web address] Nicely Sharine Cadle 11/24/2017, 11:14 AM

## 2017-12-23 ENCOUNTER — Encounter: Payer: Self-pay | Admitting: Internal Medicine

## 2017-12-23 ENCOUNTER — Ambulatory Visit (INDEPENDENT_AMBULATORY_CARE_PROVIDER_SITE_OTHER): Payer: Medicare Other | Admitting: Internal Medicine

## 2017-12-23 ENCOUNTER — Ambulatory Visit (INDEPENDENT_AMBULATORY_CARE_PROVIDER_SITE_OTHER)
Admission: RE | Admit: 2017-12-23 | Discharge: 2017-12-23 | Disposition: A | Discharge status: Home / Self Care | Payer: Medicare Other | Source: Ambulatory Visit | Attending: Internal Medicine | Admitting: Internal Medicine

## 2017-12-23 DIAGNOSIS — R05 Cough: Secondary | ICD-10-CM | POA: Diagnosis not present

## 2017-12-23 DIAGNOSIS — R911 Solitary pulmonary nodule: Secondary | ICD-10-CM

## 2017-12-23 DIAGNOSIS — R058 Other specified cough: Secondary | ICD-10-CM

## 2017-12-23 NOTE — Progress Notes (Signed)
Vincent Barrett, male    DOB: 06/14/1925,   MRN: [id number]    Brief patient profile: 89  yowm quit smoking in 1959 referred to pulmonary clinic 09/03/2017 by Dr  Vincent Barrett for cough and spn     History of Present Illness  09/03/2017  1st Office eval Chief Complaint  Patient presents with  . Pulmonary Consult    Referred by Dr. Benedetto GoadFred Barrett for eval of pulmonary nodule. He states he has had a cough for the past 10-15 years. He coughs up large amounts of clear sputum. Cough is worsened by eating and he gets choked easily.   onset of cough was around 2005 and gradually worse assoc with eating and sporadic daytime but pattern of cough occurs almost  every night esp right at hs assoc sense of pnds/  allegra didn't help, never did allergy/ ent eval  Produces lots of clear mucus/ does get choked easily but no h/o pna  rec For drainage / throat tickle try take CHLORPHENIRAMINE  4 mg - take one every 4 hours as needed GERD diet  If not improving :  Try prilosec otc 20mg   Take 30-60 min before first meal of the day and Pepcid ac (famotidine) 20 mg one @  bedtime until return to office  Please schedule a follow up office visit in 4 weeks, sooner if needed  with all medications /inhalers/ solutions in hand so we can verify exactly what you are taking. This includes all medications from all doctors and over the counters    10/06/2017  f/u ov/Vincent Barrett re:  F/u cough Did not bring all meds  Chief Complaint  Patient presents with  . Follow-up    Cough has improved slightly.    Dyspnea:  Not limited by breathing from desired activities  But very sedentary Cough: hs cough resolved on h1 rec No change    12/23/2017  f/u ov/Vincent Barrett re:  Uacs/ spn  Chief Complaint  Patient presents with  . Follow-up    Pt is doing well overall since last ov.  Dyspnea:  Not limited by breathing from desired activities    Cough: minimal Sleeping: sleeping bed is flat/ one pillow SABA use: none 02: none    No obvious  day to day or daytime variability or assoc excess/ purulent sputum or mucus plugs or hemoptysis or cp or chest tightness, subjective wheeze or overt sinus or hb symptoms.   Sleeping as above  without nocturnal  or early am exacerbation  of respiratory  c/o's or need for noct saba. Also denies any obvious fluctuation of symptoms with weather or environmental changes or other aggravating or alleviating factors except as outlined above   No unusual exposure hx or h/o childhood pna/ asthma or knowledge of premature birth.  Current Allergies, Complete Past Medical History, Past Surgical History, Family History, and Social History were reviewed in [facility] electronic medical record.  ROS  The following are not active complaints unless bolded Hoarseness, sore throat, dysphagia, dental problems, itching, sneezing,  nasal congestion or discharge of excess mucus or purulent secretions, ear ache,   fever, chills, sweats, unintended wt loss or wt gain, classically pleuritic or exertional cp,  orthopnea pnd or arm/hand swelling  or leg swelling, presyncope, palpitations, abdominal pain, anorexia, nausea, vomiting, diarrhea  or change in bowel habits or change in bladder habits, change in stools or change in urine, dysuria, hematuria,  rash, arthralgias, visual complaints, headache, numbness, weakness or ataxia or problems with  walking or coordination with freq falls,  change in mood or  memory.        Current Meds  Medication Sig  . amLODipine (NORVASC) 2.5 MG tablet Take 1 tablet (2.5 mg total) by mouth daily.  . Ascorbic Acid (VITAMIN C) 1000 MG tablet Take 1,000 mg by mouth daily.  Marland Kitchen doxazosin (CARDURA) 4 MG tablet Take 1 tablet (4 mg total) by mouth daily. KEEP OV.  Marland Kitchen ELIQUIS 5 MG TABS tablet TAKE 1 TABLET BY MOUTH TWICE A DAY  . losartan-hydrochlorothiazide (HYZAAR) 100-25 MG tablet Take 1 tablet by mouth daily.  . metoprolol succinate (TOPROL-XL) 25 MG 24 hr tablet TAKE 1 TABLET (25 MG TOTAL) BY  MOUTH DAILY.  . Multiple Vitamins-Minerals (PRESERVISION AREDS 2) CAPS Take 1 capsule by mouth daily.                       Objective:    amb wm nad / struggles to get on exam table s assistance    12/23/2017     167   10/06/17 162 lb 9.6 oz (73.8 kg)  09/03/17 163 lb (73.9 kg)  04/15/17 166 lb (75.3 kg)      Vital signs reviewed - Note on arrival 02 sats  99% on RA     HEENT: full dentures/ nl turbinates bilaterally, and oropharynx. Nl external ear canals without cough reflex   NECK :  without JVD/Nodes/TM/ nl carotid upstrokes bilaterally   LUNGS: no acc muscle use,  Nl contour chest which is clear to A and P bilaterally without cough on insp or exp maneuvers   CV:  RRR  no s3 or murmur or increase in P2, and no edema   ABD:  soft and nontender with nl inspiratory excursion in the supine position. No bruits or organomegaly appreciated, bowel sounds nl  MS:  Nl gait/ ext warm without deformities, calf tenderness, cyanosis or clubbing No obvious joint restrictions   SKIN: warm and dry without lesions    NEURO:  alert, approp, nl sensorium with  no motor or cerebellar deficits apparent.      CXR PA and Lateral:   12/23/2017 :    I personally reviewed images and agree with radiology impression as follows:   Smooth surface circular mass LLL not seen well on lateral no real change vs priors / no assoc effusion     Assessment

## 2017-12-23 NOTE — Patient Instructions (Addendum)
[facility] has it for 1 penny a pill  - Chlortab 4 mg  You can take it up to every 4 hours as needed  for runny nose, sneezing/ drainage down the throat  - it may make you drowsy   Please remember to go to the  x-ray department downstairs in the basement  for your tests - we will call you with the results when they are available.   Please schedule a follow up visit in 6  months but call sooner if needed

## 2017-12-24 ENCOUNTER — Telehealth: Payer: Self-pay | Admitting: Internal Medicine

## 2017-12-24 ENCOUNTER — Encounter: Payer: Self-pay | Admitting: Internal Medicine

## 2017-12-24 DIAGNOSIS — R911 Solitary pulmonary nodule: Secondary | ICD-10-CM

## 2017-12-24 NOTE — Assessment & Plan Note (Signed)
Onset around 2005  - Allergy profile  09/03/17  >  Eos 0.1 /  IgE  1164  Pos dust only  - rx 1st gen H1 blockers per guidelines  Starting 09/03/17 > resolved 10/06/2017   Continues to have very good control with 1st gen H1 blockers per guidelines   Advised he can buy them in [facility] for < [facility]     I had an extended discussion with the patient/ wife reviewing all relevant studies completed to date and  lasting 15 to 20 minutes of a 25 minute visit    Each maintenance medication was reviewed in detail including most importantly the difference between maintenance and prns and under what circumstances the prns are to be triggered using an action plan format that is not reflected in the computer generated alphabetically organized AVS.     Please see AVS for specific instructions unique to this visit that I personally wrote and verbalized to the the pt in detail and then reviewed with pt  by my nurse highlighting any  changes in therapy recommended at today's visit to their plan of care.

## 2017-12-24 NOTE — Assessment & Plan Note (Addendum)
1st noted 12/06/2016 in setting of freq falls/ sev rib fx so I assumed it was a callus and pt declined further w/u with ct but did agree to return for f/u cxr's and consider bx if it started to bother him in any way.  He continues to have no symptoms and continues to have mild geriatric decline with tendency to falls so the overall risk/ benefit in terms of morbidity / longevity with vs without bx favors watchful waiting - in meatime concerned about the falling and I advised him that eventually the risk / benefit to eliquis rx may need to be addressed    Discussed in detail all the  indications, usual  risks and alternatives  relative to the benefits with patient who agrees to proceed with conservative f/u as outlined    F/u in 3 months

## 2017-12-24 NOTE — Telephone Encounter (Signed)
Spoke with the pt's spouse and she states pt has decided that he wants to get his ct chest now and not wait until next ov  Per MW- ok to order  See cxr result note from 12/23/17  CT Chest ord

## 2017-12-24 NOTE — Progress Notes (Signed)
Spoke with the pt's spouse and notified of results/recs per MW She verbalized understanding and will inform the pt

## 2017-12-30 ENCOUNTER — Ambulatory Visit (INDEPENDENT_AMBULATORY_CARE_PROVIDER_SITE_OTHER)
Admission: RE | Admit: 2017-12-30 | Discharge: 2017-12-30 | Disposition: A | Discharge status: Home / Self Care | Payer: Medicare Other | Source: Ambulatory Visit | Attending: Internal Medicine | Admitting: Internal Medicine

## 2017-12-30 ENCOUNTER — Other Ambulatory Visit: Payer: Self-pay

## 2017-12-30 DIAGNOSIS — R911 Solitary pulmonary nodule: Secondary | ICD-10-CM

## 2018-01-08 ENCOUNTER — Ambulatory Visit (HOSPITAL_COMMUNITY)
Admission: RE | Admit: 2018-01-08 | Discharge: 2018-01-08 | Disposition: A | Discharge status: Home / Self Care | Payer: Medicare Other | Source: Ambulatory Visit | Attending: Internal Medicine | Admitting: Internal Medicine

## 2018-01-08 DIAGNOSIS — R911 Solitary pulmonary nodule: Secondary | ICD-10-CM | POA: Diagnosis not present

## 2018-01-08 LAB — GLUCOSE, CAPILLARY: Glucose-Capillary: 100 mg/dL — ABNORMAL HIGH (ref 70–99)

## 2018-01-08 MED ORDER — FLUDEOXYGLUCOSE F - 18 (FDG) INJECTION
8.3100 | Freq: Once | INTRAVENOUS | Status: AC
  Administered 2018-01-08: 8.31 via INTRAVENOUS

## 2018-01-11 ENCOUNTER — Encounter: Payer: Self-pay | Admitting: Internal Medicine

## 2018-01-11 ENCOUNTER — Ambulatory Visit (INDEPENDENT_AMBULATORY_CARE_PROVIDER_SITE_OTHER): Payer: Medicare Other | Admitting: Internal Medicine

## 2018-01-11 VITALS — BP 112/58 | HR 80 | Ht 64.0 in | Wt 165.0 lb

## 2018-01-11 DIAGNOSIS — R911 Solitary pulmonary nodule: Secondary | ICD-10-CM | POA: Diagnosis not present

## 2018-01-11 DIAGNOSIS — R05 Cough: Secondary | ICD-10-CM | POA: Diagnosis not present

## 2018-01-11 DIAGNOSIS — R058 Other specified cough: Secondary | ICD-10-CM

## 2018-01-11 NOTE — Assessment & Plan Note (Signed)
1st noted 12/06/2016  - CT 12/30/17   ovoid, smoothly marginated, solid subpleural nodule within the posterolateral left lower lobe. Based on plain film radiographs from 02/17/2015 and now this nodule appears to demonstrate interval growth. This is indeterminate. This may represent a benign or malignant neoplasm - PET 01/11/2018 There is mild radiotracer uptake associated with the left lower lobe subpleural lung mass. SUV max equals 2.7. This may represent a low-grade malignant neoplasm or benign liver tumor such as benign fibrous tumor of the pleura  >  Discussed at ov 01/11/2018 > declines bx > f/u 6 months with cxr only    Discussed in detail all the  indications, usual  risks and alternatives  relative to the benefits with patient who wants only to proceed with conservative f/u as outlined  - adamant wife/ pt "if it isn't bothering him then leave it alone"  Advised to be on the lookout for pleuritic or any L chest wall pain or hemoptysis - if clear growth on plain cxr at 6 months will have more points on the curve to encourage at least a CT-guided biopsy to see what kind of tumor he is dealing with as this should be reachable for bx with reasonable risk.

## 2018-01-11 NOTE — Assessment & Plan Note (Addendum)
Onset around 2005  - Allergy profile  09/03/17  >  Eos 0.1 /  IgE  1164  Pos dust only  - rx 1st gen H1 blockers per guidelines  Starting 09/03/17 > improved 01/11/2018   01/11/2018    rec trial of zyrtec 10 mg qhs  To see if he gets more effective/tolerable daytime benefit and if not just switch back to as needed chlorpheniramine.    I had an extended discussion with the patient/wife  reviewing all relevant studies completed to date and  lasting 15 to 20 minutes of a 25 minute visit    Each maintenance medication was reviewed in detail including most importantly the difference between maintenance and prns and under what circumstances the prns are to be triggered using an action plan format that is not reflected in the computer generated alphabetically organized AVS.     Please see AVS for specific instructions unique to this visit that I personally wrote and verbalized to the the pt in detail and then reviewed with pt  by my nurse highlighting any  changes in therapy recommended at today's visit to their plan of care.

## 2018-01-11 NOTE — Patient Instructions (Addendum)
You can try zyrtec 10 mg one pill at bedtime, good for 24 h  On a trial basis instead of chlorpheniramine    Please schedule a follow up visit in 6 months but call sooner if needed  - need cxr on return

## 2018-01-11 NOTE — Progress Notes (Signed)
Vincent Barrett, male    DOB: 10/06/1925,   MRN: [id number]    Brief patient profile: 89  yowm quit smoking in 1959 referred to pulmonary clinic 09/03/2017 by Dr  Benedetto GoadFred wilson for cough and spn     History of Present Illness  09/03/2017  1st Office eval Chief Complaint  Patient presents with  . Pulmonary Consult    Referred by Dr. Benedetto GoadFred Wilson for eval of pulmonary nodule. He states he has had a cough for the past 10-15 years. He coughs up large amounts of clear sputum. Cough is worsened by eating and he gets choked easily.   onset of cough was around 2005 and gradually worse assoc with eating and sporadic daytime but pattern of cough occurs almost  every night esp right at hs assoc sense of pnds/  allegra didn't help, never did allergy/ ent eval  Produces lots of clear mucus/ does get choked easily but no h/o pna  rec For drainage / throat tickle try take CHLORPHENIRAMINE  4 mg - take one every 4 hours as needed GERD diet  If not improving :  Try prilosec otc 20mg   Take 30-60 min before first meal of the day and Pepcid ac (famotidine) 20 mg one @  bedtime until return to office  Please schedule a follow up office visit in 4 weeks, sooner if needed  with all medications /inhalers/ solutions in hand so we can verify exactly what you are taking. This includes all medications from all doctors and over the counters    10/06/2017  f/u ov/Keigan Tafoya re:  F/u cough Did not bring all meds  Chief Complaint  Patient presents with  . Follow-up    Cough has improved slightly.    Dyspnea:  Not limited by breathing from desired activities  But very sedentary Cough: hs cough resolved on h1 rec No change    12/23/2017  f/u ov/Jazmeen Axtell re:  Uacs/ spn  Chief Complaint  Patient presents with  . Follow-up    Pt is doing well overall since last ov.  Dyspnea:  Not limited by breathing from desired activities    Cough: minimal Sleeping: sleeping bed is flat/ one pillow SABA use: none 02: none   rec [facility] has it for 1 penny a pill  - Chlortab 4 mg  You can take it up to every 4 hours as needed  for runny nose, sneezing/ drainage down the throat  - it may make you drowsy Please remember to go to the  x-ray department downstairs in the basement  for your tests - we will call you with the results when they are available.     01/11/2018  f/u ov/Carmellia Kreisler re:  UACS better when takes 1st gen H1 blockers per guidelines  / SPN LLL  Chief Complaint  Patient presents with  . Follow-up    review PET. pt c/o stable prod cough with clear/white mucus, runny nose, pnd.      Dyspnea:  Not limited by breathing from desired activities   Cough: better at hs and during the day when take 1st gen H1 blockers  Sleeping: flat bed/ one pillow SABA use: none  02: none    No obvious day to day or daytime variability or assoc excess/ purulent sputum or mucus plugs or hemoptysis or cp or chest tightness, subjective wheeze or overt sinus or hb symptoms.   Sleeping as above  without nocturnal  or early am exacerbation  of respiratory  c/o's or  need for noct saba. Also denies any obvious fluctuation of symptoms with weather or environmental changes or other aggravating or alleviating factors except as outlined above   No unusual exposure hx or h/o childhood pna/ asthma or knowledge of premature birth.  Current Allergies, Complete Past Medical History, Past Surgical History, Family History, and Social History were reviewed in [employer] record.  ROS  The following are not active complaints unless bolded Hoarseness, sore throat, dysphagia, dental problems, itching, sneezing,  nasal congestion or discharge of excess mucus or purulent secretions, ear ache,   fever, chills, sweats, unintended wt loss or wt gain, classically pleuritic or exertional cp,  orthopnea pnd or arm/hand swelling  or leg swelling, presyncope, palpitations, abdominal pain, anorexia, nausea, vomiting, diarrhea  or  change in bowel habits or change in bladder habits, change in stools or change in urine, dysuria, hematuria,  rash, arthralgias, visual complaints, headache, numbness, weakness or ataxia or problems with walking or coordination,  change in mood or  memory.        Current Meds  Medication Sig  . amLODipine (NORVASC) 2.5 MG tablet Take 1 tablet (2.5 mg total) by mouth daily.  . Ascorbic Acid (VITAMIN C) 1000 MG tablet Take 1,000 mg by mouth daily.  . chlorpheniramine (CHLOR-TRIMETON) 4 MG tablet Take 4 mg by mouth every 4 (four) hours as needed for allergies.  Marland Kitchen doxazosin (CARDURA) 4 MG tablet Take 1 tablet (4 mg total) by mouth daily. KEEP OV.  Marland Kitchen ELIQUIS 5 MG TABS tablet TAKE 1 TABLET BY MOUTH TWICE A DAY  . losartan-hydrochlorothiazide (HYZAAR) 100-25 MG tablet Take 1 tablet by mouth daily.  . metoprolol succinate (TOPROL-XL) 25 MG 24 hr tablet TAKE 1 TABLET (25 MG TOTAL) BY MOUTH DAILY.  . Multiple Vitamins-Minerals (PRESERVISION AREDS 2) CAPS Take 1 capsule by mouth daily.           Objective:    Amb wm nad    01/11/2018        165   12/23/2017     167   10/06/17 162 lb 9.6 oz (73.8 kg)  09/03/17 163 lb (73.9 kg)  04/15/17 166 lb (75.3 kg)      Vital signs reviewed - Note on arrival 02 sats  99% on RA       HEENT: Full denturres/ nl  turbinates bilaterally, and oropharynx. Nl external ear canals without cough reflex   NECK :  without JVD/Nodes/TM/ nl carotid upstrokes bilaterally   LUNGS: no acc muscle use,  Nl contour chest which is clear to A and P bilaterally without cough on insp or exp maneuvers   CV:  RRR  no s3 or murmur or increase in P2, and no edema   ABD:  soft and nontender with nl inspiratory excursion in the supine position. No bruits or organomegaly appreciated, bowel sounds nl  MS:  Nl gait/ ext warm without deformities, calf tenderness, cyanosis or clubbing No obvious joint restrictions   SKIN: warm and dry without lesions    NEURO:  alert, approp,  nl sensorium with  no motor or cerebellar deficits apparent.        I personally reviewed images and agree with radiology impression as follows:   Chest CT PET 01/08/18 : There is mild radiotracer uptake associated with the left lower lobe subpleural lung mass. SUV max equals 2.7.           Assessment

## 2018-01-18 ENCOUNTER — Telehealth: Payer: Self-pay | Admitting: Internal Medicine

## 2018-01-18 MED ORDER — LOSARTAN POTASSIUM-HCTZ 100-25 MG PO TABS: 1.0000 | ORAL_TABLET | Freq: Every day | ORAL | 3 refills | Status: DC

## 2018-01-18 NOTE — Telephone Encounter (Signed)
Refill sent to the pharmacy electronically.  

## 2018-01-18 NOTE — Telephone Encounter (Signed)
New Message    *STAT* If patient is at the pharmacy, call can be transferred to refill team.   1. Which medications need to be refilled? (please list name of each medication and dose if known) losartan-hydrochlorothiazide (HYZAAR) 100-25 MG tablet  2. Which pharmacy/location (including street and city if local pharmacy) is medication to be sent to? [id number] - [place], [place] - [address]  3. Do they need a 30 day or 90 day supply? 90 day supply

## 2018-01-21 ENCOUNTER — Other Ambulatory Visit: Payer: Self-pay | Admitting: Pharmacist

## 2018-01-21 MED ORDER — APIXABAN 5 MG PO TABS: 5.0000 mg | ORAL_TABLET | Freq: Two times a day (BID) | ORAL | 0 refills | Status: DC

## 2018-02-16 ENCOUNTER — Other Ambulatory Visit: Payer: Self-pay | Admitting: Internal Medicine

## 2018-02-16 NOTE — Telephone Encounter (Signed)
°*  STAT* If patient is at the pharmacy, call can be transferred to refill team.   1. Which medications need to be refilled? (please list name of each medication and dose if known) Eliquis  2. Which pharmacy/location (including street and city if local pharmacy) is medication to be sent to? [facility] [phone]  3. Do they need a 30 day or 90 day supply? 180 and refills

## 2018-02-17 MED ORDER — APIXABAN 5 MG PO TABS: 5.0000 mg | ORAL_TABLET | Freq: Two times a day (BID) | ORAL | 0 refills | Status: DC

## 2018-02-17 NOTE — Telephone Encounter (Signed)
Patient may need change from apixaban 5mg  to apixaban 2.5mg  (will repeat BMET ASAP). Rx sent for 30 days until new results available.

## 2018-03-17 ENCOUNTER — Encounter: Payer: Self-pay | Admitting: Internal Medicine

## 2018-03-17 ENCOUNTER — Ambulatory Visit (INDEPENDENT_AMBULATORY_CARE_PROVIDER_SITE_OTHER): Payer: Medicare Other | Admitting: Internal Medicine

## 2018-03-17 VITALS — BP 114/54 | HR 49 | Ht 64.0 in | Wt 170.0 lb

## 2018-03-17 DIAGNOSIS — I10 Essential (primary) hypertension: Secondary | ICD-10-CM | POA: Diagnosis not present

## 2018-03-17 DIAGNOSIS — I4892 Unspecified atrial flutter: Secondary | ICD-10-CM | POA: Diagnosis not present

## 2018-03-17 NOTE — Progress Notes (Signed)
OFFICE NOTE  Chief Complaint:  Routine follow-up  Primary Care Physician: Barbie BannerWilson, Fred H, MD  HPI:  Vincent Barrett is a pleasant 85 year old male previously followed by Dr. Alanda AmassWeintraub who is here today to establish cardiac care with me. His past medical history is significant for an episode of paroxysmal atrial fibrillation in June of 2013. He converted spontaneously back to sinus rhythm and seems to maintain that. Based on the relative infrequency of his episodes, he has been maintained on aspirin. He has as history of hypertension which is fairly well-controlled although he does have an element of whitecoat hypertension. He was a former smoker but quit over 50 years ago. He had a negative Myoview for ischemia in June of 2013.  He denies any chest pain or worsening shortness of breath.  Vincent Barrett returns today for follow-up. He denies any chest pain or worsening shortness of breath. He continues to have lower leg swelling. This may be related to the Norvasc although recent decreases in his Norvasc has not improved the swelling. He is on low-dose Hyzaar. He reports no recurrent atrial fibrillation. Is not clear whether he actually had A. fib in the past or perhaps very frequent PACs. Either way the burden of his A. fib is very low and therefore aspirin therapy is reasonable.  I saw Vincent Barrett back today in the office for follow-up. Unfortunately he fell on the snow and ice in his driveway on Saturday. He was seen in [facility] and given some tramadol for pain. This seems to be not holding him very well. I defer to his primary care provider as he may need a little stronger pain medicine. He is using a pillow to splint his chest. Workup revealed some broken ribs and he had some soft tissue head trauma but little concern for significant head injury. Fortunately he is only on low-dose aspirin. Blood pressure surprisingly is well-controlled on his current medicines. He denies any chest pain or  palpitations or shortness of breath prior to his fall and of course now has some pleuritic chest wall pain secondary to his rib injury.  02/21/2016  Vincent Barrett returns for follow-up. Over the past year he has no new complaints. He denies any chest pain or worsening shortness of breath. He occasionally gets some swelling in his right ankle. He was advised to wear compression stockings but is been hesitant to do that. Blood pressure is excellent at 118/62. He rarely takes aspirin, more likely as needed for headaches. EKG shows sinus rhythm with first-degree AV block at 65.  02/20/2017  Vincent Barrett was seen today in follow-up.  He is again without any significant complaints.  An EKG today however shows that he is in atrial flutter with a slow ventricular response and heart rate of 49.  A flutter rate appears to be fairly quick around 300 beats a minute.  When asked about whether he is symptomatic with this he really did not describe any symptoms although his wife is noted over the past several months that he has had decreased energy level and seems to want to go back to bed later in the morning after waking up.  He denies any chest pain or worsening shortness of breath.  He was previously on aspirin for a remote history of atrial fibrillation however says he does not take it regularly.  Based on age history of hypertension his CHADSVASC score is 3.  03/25/2017  Vincent Barrett returns today for follow-up of atrial  flutter.  He is persistently in atrial flutter with slow ventricular response and heart rate of 49.  He is on metoprolol succinate 25 mg daily.  Despite the slow ventricular response he is asymptomatic.  We again discussed the possibility of cardioversion.  He is not sure if he would benefit from it but his wife noted that he has been more fatigued.  Her concern is that she also had atrial fibrillation and did not respond to cardioversion.  That being said, he may very well respond to this treatment.  I  suspect has been in flutter since he had a trauma with fall in the fall of last year.  Reports good compliance with his Eliquis twice daily.  11/24/2017  Vincent Barrett was seen today in follow-up.  He underwent successful cardioversion and February 2019 however ultimately returned back to atrial flutter.  He was seen in follow-up in March by Joni Reining, DNP, who felt that he was asymptomatic and recommended a rate control strategy.  Since then he is maintained good rate control over his atrial flutter and remains anticoagulated on Eliquis.  He has no specific complaints today other than some mild tingling in his left upper back which he said started after his cardioversion but is not totally gone away.  He does think it is getting better.  03/17/2018  Vincent Barrett returns today in follow-up with his wife.  He continues to feel well and is asymptomatic.  He remains in atrial flutter with variable AV response but typically is bradycardic.  Heart rate today is 49.  Despite this he denies any fatigue, chest pain or worsening exercise tolerance.  He is tolerating Eliquis without bleeding issues.  His last hemoglobin in July 2019 was 14.  PMHx:  Past Medical History:  Diagnosis Date  . BPH (benign prostatic hyperplasia)   . COPD (chronic obstructive pulmonary disease) (HCC)   . Hypertension   . OA (osteoarthritis)   . PAF (paroxysmal atrial fibrillation) (HCC)     Past Surgical History:  Procedure Laterality Date  . CARDIOVERSION N/A 03/31/2017   Procedure: CARDIOVERSION;  Surgeon: Lars Masson, MD;  [facility] ENDOSCOPY;  Service: Cardiovascular;  Laterality: N/A;  . CARPAL TUNNEL RELEASE  02/2010   Dr. Franky Macho  . NM MYOCAR PERF WALL MOTION  08/08/2011   lexiscan; normal pattern of perfusion in all regions; post-stress EF 69%; low risk scan   . TRANSTHORACIC ECHOCARDIOGRAM  2013   EF 52%, mild AV leaflet thickening; trace TR    FAMHx:  No family history on file.  SOCHx:   reports that  he quit smoking about 61 years ago. His smoking use included cigarettes. He has a 22.00 pack-year smoking history. He has never used smokeless tobacco. He reports that he does not drink alcohol or use drugs.  ALLERGIES:  Allergies  Allergen Reactions  . Quinine Nausea And Vomiting    ROS: Pertinent items noted in HPI and remainder of comprehensive ROS otherwise negative.  HOME MEDS: Current Outpatient Medications  Medication Sig Dispense Refill  . amLODipine (NORVASC) 2.5 MG tablet Take 1 tablet (2.5 mg total) by mouth daily. 90 tablet 3  . apixaban (ELIQUIS) 5 MG TABS tablet Take 1 tablet (5 mg total) by mouth 2 (two) times daily. LAST REFILL UNTIL F/U WITH CARDIOLOGIST. 60 tablet 0  . Ascorbic Acid (VITAMIN C) 1000 MG tablet Take 1,000 mg by mouth daily.    . chlorpheniramine (CHLOR-TRIMETON) 4 MG tablet Take 4 mg by mouth every 4 (four)  hours as needed for allergies.    Marland Kitchen. doxazosin (CARDURA) 4 MG tablet Take 1 tablet (4 mg total) by mouth daily. KEEP OV. 90 tablet 0  . losartan-hydrochlorothiazide (HYZAAR) 100-25 MG tablet Take 1 tablet by mouth daily. 90 tablet 3  . metoprolol succinate (TOPROL-XL) 25 MG 24 hr tablet TAKE 1 TABLET (25 MG TOTAL) BY MOUTH DAILY. 90 tablet 2  . Multiple Vitamins-Minerals (PRESERVISION AREDS 2) CAPS Take 1 capsule by mouth daily.     No current facility-administered medications for this visit.     LABS/IMAGING: No results found for this or any previous visit (from the past 48 hour(s)). No results found.  VITALS: BP (!) 114/54   Pulse (!) 49   Ht 5\' 4"  (1.626 m)   Wt 170 lb (77.1 kg)   BMI 29.18 kg/m   EXAM: General appearance: alert and no distress Neck: no carotid bruit and no JVD Lungs: clear to auscultation bilaterally Heart: irregularly irregular rhythm Abdomen: soft, non-tender; bowel sounds normal; no masses,  no organomegaly Extremities: edema Trace lower extremity edema Pulses: 2+ and symmetric Skin: Skin color, texture, turgor  normal. No rashes or lesions Neurologic: Grossly normal Psych: Pleasant  EKG: Atrial flutter with variable AV block at 49-personally reviewed  ASSESSMENT: 1. Persistent atrial flutter -failed cardioversion, rate control strategy 2. CHADSVASC score of 3 - on Eliquis 3. Hypertension-controlled 4. Remote episode of paroxysmal atrial fibrillation 5. Right leg edema -improved  PLAN: 1.   Vincent Barrett remains in atrial flutter now with slow ventricular response however he seems asymptomatic with this.  I am hesitant to make changes in his medications however he is on Toprol-XL 25 mg daily as well as Hyzaar and amlodipine.  Blood pressure is excellent at this time with a near low diastolic.  Since he is not had very recent labs on anticoagulation, will check a CBC and metabolic profile.  Follow-up in 6 months or sooner as necessary.  Chrystie NoseKenneth C. Hilty, MD, [facility] Surgery Center IncFACC, FACP  [place]  [facility]  Medical Director of the Advanced Lipid Disorders &  Cardiovascular Risk Reduction Clinic Diplomate of the American Board of Clinical Lipidology Attending Cardiologist  Direct Dial: [phone]  Fax: [phone]  Website:  [email or web address]  Kenneth C Hilty 03/17/2018, 3:16 PM

## 2018-03-17 NOTE — Patient Instructions (Signed)
Medication Instructions:  Your physician recommends that you continue on your current medications as directed. Please refer to the Current Medication list given to you today.  If you need a refill on your cardiac medications before your next appointment, please call your pharmacy.   Lab work: Scientist, product/process development today If you have labs (blood work) drawn today and your tests are completely normal, you will receive your results only by: Marland Kitchen MyChart Message (if you have MyChart) OR . A paper copy in the mail If you have any lab test that is abnormal or we need to change your treatment, we will call you to review the results.  Testing/Procedures: None ordered  Follow-Up: At [facility], you and your health needs are our priority.  As part of our continuing mission to provide you with exceptional heart care, we have created designated Provider Care Teams.  These Care Teams include your primary Cardiologist (physician) and Advanced Practice Providers (APPs -  Physician Assistants and Nurse Practitioners) who all work together to provide you with the care you need, when you need it. You will need a follow up appointment in 6 months.  Please call our office 2 months in advance to schedule this appointment.  You may see Chrystie Nose, MD or one of the following Advanced Practice Providers on your designated Care Team: [place], [place] . Micah Flesher, PA-C

## 2018-03-18 LAB — CBC WITH DIFFERENTIAL/PLATELET
Basophils Absolute: 0.1 10*3/uL (ref 0.0–0.2)
Basos: 1 %
EOS (ABSOLUTE): 0.4 10*3/uL (ref 0.0–0.4)
Eos: 5 %
Hematocrit: 38.9 % (ref 37.5–51.0)
Hemoglobin: 13.1 g/dL (ref 13.0–17.7)
IMMATURE GRANS (ABS): 0 10*3/uL (ref 0.0–0.1)
IMMATURE GRANULOCYTES: 0 %
Lymphocytes Absolute: 2.2 10*3/uL (ref 0.7–3.1)
Lymphs: 33 %
MCH: 30.8 pg (ref 26.6–33.0)
MCHC: 33.7 g/dL (ref 31.5–35.7)
MCV: 91 fL (ref 79–97)
Monocytes Absolute: 0.9 10*3/uL (ref 0.1–0.9)
Monocytes: 13 %
NEUTROS PCT: 48 %
Neutrophils Absolute: 3.2 10*3/uL (ref 1.4–7.0)
Platelets: 248 10*3/uL (ref 150–450)
RBC: 4.26 x10E6/uL (ref 4.14–5.80)
RDW: 12.3 % (ref 11.6–15.4)
WBC: 6.8 10*3/uL (ref 3.4–10.8)

## 2018-03-18 LAB — BASIC METABOLIC PANEL
BUN/Creatinine Ratio: 12 (ref 10–24)
BUN: 20 mg/dL (ref 10–36)
CO2: 24 mmol/L (ref 20–29)
Calcium: 9.3 mg/dL (ref 8.6–10.2)
Chloride: 98 mmol/L (ref 96–106)
Creatinine, Ser: 1.64 mg/dL — ABNORMAL HIGH (ref 0.76–1.27)
GFR calc Af Amer: 42 mL/min/{1.73_m2} — ABNORMAL LOW (ref >59)
GFR calc non Af Amer: 36 mL/min/{1.73_m2} — ABNORMAL LOW (ref >59)
Glucose: 102 mg/dL — ABNORMAL HIGH (ref 65–99)
Potassium: 5.1 mmol/L (ref 3.5–5.2)
Sodium: 138 mmol/L (ref 134–144)

## 2018-03-20 ENCOUNTER — Encounter: Payer: Self-pay | Admitting: Internal Medicine

## 2018-03-22 ENCOUNTER — Telehealth: Payer: Self-pay

## 2018-03-22 NOTE — Telephone Encounter (Signed)
DPR on file. Pt wife aware of lab results with verbalized understanding.

## 2018-03-22 NOTE — Telephone Encounter (Signed)
-----   Message from Chrystie Nose, MD sent at 03/20/2018 11:02 AM EST ----- Stable labs, no anemia.  Dr. Rexene Edison

## 2018-04-14 ENCOUNTER — Inpatient Hospital Stay (HOSPITAL_COMMUNITY)
Admission: EM | Admit: 2018-04-14 | Discharge: 2018-04-23 | DRG: 418 | Disposition: A | Discharge status: Home Health | Payer: Medicare Other | Attending: Internal Medicine | Admitting: Internal Medicine

## 2018-04-14 ENCOUNTER — Other Ambulatory Visit: Payer: Self-pay

## 2018-04-14 ENCOUNTER — Encounter (HOSPITAL_COMMUNITY): Payer: Self-pay | Admitting: Emergency Medicine

## 2018-04-14 DIAGNOSIS — K810 Acute cholecystitis: Secondary | ICD-10-CM

## 2018-04-14 DIAGNOSIS — K560 Paralytic ileus: Secondary | ICD-10-CM | POA: Diagnosis present

## 2018-04-14 DIAGNOSIS — E876 Hypokalemia: Secondary | ICD-10-CM | POA: Diagnosis present

## 2018-04-14 DIAGNOSIS — K8001 Calculus of gallbladder with acute cholecystitis with obstruction: Secondary | ICD-10-CM

## 2018-04-14 DIAGNOSIS — N400 Enlarged prostate without lower urinary tract symptoms: Secondary | ICD-10-CM | POA: Diagnosis present

## 2018-04-14 DIAGNOSIS — M1990 Unspecified osteoarthritis, unspecified site: Secondary | ICD-10-CM | POA: Diagnosis present

## 2018-04-14 DIAGNOSIS — Z87891 Personal history of nicotine dependence: Secondary | ICD-10-CM

## 2018-04-14 DIAGNOSIS — Z79899 Other long term (current) drug therapy: Secondary | ICD-10-CM

## 2018-04-14 DIAGNOSIS — Z7901 Long term (current) use of anticoagulants: Secondary | ICD-10-CM

## 2018-04-14 DIAGNOSIS — I129 Hypertensive chronic kidney disease with stage 1 through stage 4 chronic kidney disease, or unspecified chronic kidney disease: Secondary | ICD-10-CM | POA: Diagnosis present

## 2018-04-14 DIAGNOSIS — R7989 Other specified abnormal findings of blood chemistry: Secondary | ICD-10-CM

## 2018-04-14 DIAGNOSIS — K8070 Calculus of gallbladder and bile duct without cholecystitis without obstruction: Secondary | ICD-10-CM

## 2018-04-14 DIAGNOSIS — R945 Abnormal results of liver function studies: Secondary | ICD-10-CM

## 2018-04-14 DIAGNOSIS — E871 Hypo-osmolality and hyponatremia: Secondary | ICD-10-CM | POA: Diagnosis present

## 2018-04-14 DIAGNOSIS — R066 Hiccough: Secondary | ICD-10-CM | POA: Diagnosis present

## 2018-04-14 DIAGNOSIS — I480 Paroxysmal atrial fibrillation: Secondary | ICD-10-CM | POA: Diagnosis present

## 2018-04-14 DIAGNOSIS — R109 Unspecified abdominal pain: Secondary | ICD-10-CM | POA: Diagnosis present

## 2018-04-14 DIAGNOSIS — H9190 Unspecified hearing loss, unspecified ear: Secondary | ICD-10-CM | POA: Diagnosis present

## 2018-04-14 DIAGNOSIS — N183 Chronic kidney disease, stage 3 unspecified: Secondary | ICD-10-CM | POA: Diagnosis present

## 2018-04-14 DIAGNOSIS — I10 Essential (primary) hypertension: Secondary | ICD-10-CM | POA: Diagnosis present

## 2018-04-14 DIAGNOSIS — N289 Disorder of kidney and ureter, unspecified: Secondary | ICD-10-CM

## 2018-04-14 DIAGNOSIS — J449 Chronic obstructive pulmonary disease, unspecified: Secondary | ICD-10-CM | POA: Diagnosis present

## 2018-04-14 DIAGNOSIS — K8063 Calculus of gallbladder and bile duct with acute cholecystitis with obstruction: Principal | ICD-10-CM | POA: Diagnosis present

## 2018-04-14 DIAGNOSIS — I4892 Unspecified atrial flutter: Secondary | ICD-10-CM | POA: Diagnosis present

## 2018-04-14 DIAGNOSIS — K9189 Other postprocedural complications and disorders of digestive system: Secondary | ICD-10-CM | POA: Diagnosis present

## 2018-04-14 DIAGNOSIS — R17 Unspecified jaundice: Secondary | ICD-10-CM

## 2018-04-14 DIAGNOSIS — I4891 Unspecified atrial fibrillation: Secondary | ICD-10-CM | POA: Diagnosis present

## 2018-04-14 DIAGNOSIS — K567 Ileus, unspecified: Secondary | ICD-10-CM

## 2018-04-14 LAB — COMPREHENSIVE METABOLIC PANEL
ALT: 347 U/L — ABNORMAL HIGH (ref 0–44)
AST: 284 U/L — ABNORMAL HIGH (ref 15–41)
Albumin: 3.9 g/dL (ref 3.5–5.0)
Alkaline Phosphatase: 158 U/L — ABNORMAL HIGH (ref 38–126)
Anion gap: 14 (ref 5–15)
BUN: 21 mg/dL (ref 8–23)
CHLORIDE: 99 mmol/L (ref 98–111)
CO2: 21 mmol/L — ABNORMAL LOW (ref 22–32)
Calcium: 9.3 mg/dL (ref 8.9–10.3)
Creatinine, Ser: 1.54 mg/dL — ABNORMAL HIGH (ref 0.61–1.24)
GFR calc Af Amer: 45 mL/min — ABNORMAL LOW (ref >60)
GFR calc non Af Amer: 39 mL/min — ABNORMAL LOW (ref >60)
Glucose, Bld: 168 mg/dL — ABNORMAL HIGH (ref 70–99)
Potassium: 3.2 mmol/L — ABNORMAL LOW (ref 3.5–5.1)
Sodium: 134 mmol/L — ABNORMAL LOW (ref 135–145)
Total Bilirubin: 3.4 mg/dL — ABNORMAL HIGH (ref 0.3–1.2)
Total Protein: 7.7 g/dL (ref 6.5–8.1)

## 2018-04-14 LAB — CBC
HCT: 39.8 % (ref 39.0–52.0)
Hemoglobin: 13.7 g/dL (ref 13.0–17.0)
MCH: 30.9 pg (ref 26.0–34.0)
MCHC: 34.4 g/dL (ref 30.0–36.0)
MCV: 89.6 fL (ref 80.0–100.0)
Platelets: 225 10*3/uL (ref 150–400)
RBC: 4.44 MIL/uL (ref 4.22–5.81)
RDW: 11.8 % (ref 11.5–15.5)
WBC: 13.8 10*3/uL — AB (ref 4.0–10.5)
nRBC: 0 % (ref 0.0–0.2)

## 2018-04-14 LAB — LIPASE, BLOOD: LIPASE: 23 U/L (ref 11–51)

## 2018-04-14 MED ORDER — SODIUM CHLORIDE 0.9% FLUSH: 3.0000 mL | Freq: Once | INTRAVENOUS | Status: DC

## 2018-04-14 NOTE — ED Triage Notes (Signed)
Pt c/o abdominal pain with nausea and vomiting. Denies diarrhea, no urinary symptoms.

## 2018-04-15 ENCOUNTER — Emergency Department (HOSPITAL_COMMUNITY): Payer: Medicare Other

## 2018-04-15 ENCOUNTER — Encounter (HOSPITAL_COMMUNITY): Payer: Self-pay | Admitting: Radiology

## 2018-04-15 ENCOUNTER — Inpatient Hospital Stay (HOSPITAL_COMMUNITY): Payer: Medicare Other

## 2018-04-15 DIAGNOSIS — K8000 Calculus of gallbladder with acute cholecystitis without obstruction: Secondary | ICD-10-CM | POA: Diagnosis not present

## 2018-04-15 DIAGNOSIS — I4891 Unspecified atrial fibrillation: Secondary | ICD-10-CM

## 2018-04-15 DIAGNOSIS — R066 Hiccough: Secondary | ICD-10-CM | POA: Diagnosis present

## 2018-04-15 DIAGNOSIS — N183 Chronic kidney disease, stage 3 unspecified: Secondary | ICD-10-CM | POA: Diagnosis present

## 2018-04-15 DIAGNOSIS — I129 Hypertensive chronic kidney disease with stage 1 through stage 4 chronic kidney disease, or unspecified chronic kidney disease: Secondary | ICD-10-CM | POA: Diagnosis not present

## 2018-04-15 DIAGNOSIS — Z79899 Other long term (current) drug therapy: Secondary | ICD-10-CM | POA: Diagnosis not present

## 2018-04-15 DIAGNOSIS — K8043 Calculus of bile duct with acute cholecystitis with obstruction: Secondary | ICD-10-CM | POA: Diagnosis not present

## 2018-04-15 DIAGNOSIS — I480 Paroxysmal atrial fibrillation: Secondary | ICD-10-CM

## 2018-04-15 DIAGNOSIS — R109 Unspecified abdominal pain: Secondary | ICD-10-CM | POA: Diagnosis present

## 2018-04-15 DIAGNOSIS — K8070 Calculus of gallbladder and bile duct without cholecystitis without obstruction: Secondary | ICD-10-CM

## 2018-04-15 DIAGNOSIS — K9189 Other postprocedural complications and disorders of digestive system: Secondary | ICD-10-CM | POA: Diagnosis present

## 2018-04-15 DIAGNOSIS — I4892 Unspecified atrial flutter: Secondary | ICD-10-CM | POA: Diagnosis present

## 2018-04-15 DIAGNOSIS — I10 Essential (primary) hypertension: Secondary | ICD-10-CM | POA: Diagnosis not present

## 2018-04-15 DIAGNOSIS — H9190 Unspecified hearing loss, unspecified ear: Secondary | ICD-10-CM | POA: Diagnosis present

## 2018-04-15 DIAGNOSIS — R1013 Epigastric pain: Secondary | ICD-10-CM

## 2018-04-15 DIAGNOSIS — K560 Paralytic ileus: Secondary | ICD-10-CM | POA: Diagnosis present

## 2018-04-15 DIAGNOSIS — Z01810 Encounter for preprocedural cardiovascular examination: Secondary | ICD-10-CM

## 2018-04-15 DIAGNOSIS — E871 Hypo-osmolality and hyponatremia: Secondary | ICD-10-CM | POA: Diagnosis present

## 2018-04-15 DIAGNOSIS — J449 Chronic obstructive pulmonary disease, unspecified: Secondary | ICD-10-CM | POA: Diagnosis present

## 2018-04-15 DIAGNOSIS — M1990 Unspecified osteoarthritis, unspecified site: Secondary | ICD-10-CM | POA: Diagnosis present

## 2018-04-15 DIAGNOSIS — E876 Hypokalemia: Secondary | ICD-10-CM

## 2018-04-15 DIAGNOSIS — Z87891 Personal history of nicotine dependence: Secondary | ICD-10-CM | POA: Diagnosis not present

## 2018-04-15 DIAGNOSIS — N400 Enlarged prostate without lower urinary tract symptoms: Secondary | ICD-10-CM | POA: Diagnosis present

## 2018-04-15 DIAGNOSIS — Z7901 Long term (current) use of anticoagulants: Secondary | ICD-10-CM | POA: Diagnosis not present

## 2018-04-15 DIAGNOSIS — K8063 Calculus of gallbladder and bile duct with acute cholecystitis with obstruction: Secondary | ICD-10-CM | POA: Diagnosis present

## 2018-04-15 LAB — POC OCCULT BLOOD, ED: Fecal Occult Bld: NEGATIVE

## 2018-04-15 LAB — URINALYSIS, ROUTINE W REFLEX MICROSCOPIC
BACTERIA UA: NONE SEEN
Bilirubin Urine: NEGATIVE
Glucose, UA: NEGATIVE mg/dL
Ketones, ur: 5 mg/dL — AB
NITRITE: NEGATIVE
Protein, ur: 30 mg/dL — AB
Specific Gravity, Urine: 1.04 — ABNORMAL HIGH (ref 1.005–1.030)
WBC, UA: >50 WBC/hpf — ABNORMAL HIGH (ref 0–5)
pH: 5 (ref 5.0–8.0)

## 2018-04-15 MED ORDER — IOHEXOL 300 MG/ML  SOLN
80.0000 mL | Freq: Once | INTRAMUSCULAR | Status: AC | PRN
  Administered 2018-04-15: 80 mL via INTRAVENOUS

## 2018-04-15 MED ORDER — SODIUM CHLORIDE 0.9 % IV BOLUS
1000.0000 mL | Freq: Once | INTRAVENOUS | Status: AC
  Administered 2018-04-15: 1000 mL via INTRAVENOUS

## 2018-04-15 MED ORDER — PRESERVISION AREDS 2 PO CAPS: 1.0000 | ORAL_CAPSULE | Freq: Every day | ORAL | Status: DC

## 2018-04-15 MED ORDER — AMLODIPINE BESYLATE 2.5 MG PO TABS
2.5000 mg | ORAL_TABLET | Freq: Every day | ORAL | Status: DC
  Administered 2018-04-15 – 2018-04-23 (×8): 2.5 mg via ORAL
  Filled 2018-04-15 (×8): qty 1

## 2018-04-15 MED ORDER — ONDANSETRON HCL 4 MG/2ML IJ SOLN
4.0000 mg | Freq: Four times a day (QID) | INTRAMUSCULAR | Status: DC | PRN
  Administered 2018-04-17 – 2018-04-23 (×3): 4 mg via INTRAVENOUS
  Filled 2018-04-15 (×3): qty 2

## 2018-04-15 MED ORDER — MORPHINE SULFATE (PF) 4 MG/ML IV SOLN
4.0000 mg | Freq: Once | INTRAVENOUS | Status: AC
  Administered 2018-04-15: 4 mg via INTRAVENOUS
  Filled 2018-04-15: qty 1

## 2018-04-15 MED ORDER — SODIUM CHLORIDE 0.9 % IV SOLN
INTRAVENOUS | Status: DC
  Administered 2018-04-15 – 2018-04-16 (×3): via INTRAVENOUS

## 2018-04-15 MED ORDER — HYDROCODONE-ACETAMINOPHEN 5-325 MG PO TABS: 1.0000 | ORAL_TABLET | ORAL | Status: DC | PRN

## 2018-04-15 MED ORDER — MORPHINE SULFATE (PF) 2 MG/ML IV SOLN
2.0000 mg | INTRAVENOUS | Status: DC | PRN
  Administered 2018-04-16: 2 mg via INTRAVENOUS
  Filled 2018-04-15: qty 1

## 2018-04-15 MED ORDER — VITAMIN C 500 MG PO TABS
1000.0000 mg | ORAL_TABLET | Freq: Every day | ORAL | Status: DC
  Administered 2018-04-15 – 2018-04-23 (×8): 1000 mg via ORAL
  Filled 2018-04-15 (×8): qty 2

## 2018-04-15 MED ORDER — ALBUTEROL SULFATE (2.5 MG/3ML) 0.083% IN NEBU
2.5000 mg | INHALATION_SOLUTION | RESPIRATORY_TRACT | Status: DC | PRN
  Administered 2018-04-20 – 2018-04-21 (×2): 2.5 mg via RESPIRATORY_TRACT
  Filled 2018-04-15 (×2): qty 3

## 2018-04-15 MED ORDER — ONDANSETRON HCL 4 MG/2ML IJ SOLN
4.0000 mg | Freq: Once | INTRAMUSCULAR | Status: AC
  Administered 2018-04-15: 4 mg via INTRAVENOUS
  Filled 2018-04-15: qty 2

## 2018-04-15 MED ORDER — PIPERACILLIN-TAZOBACTAM 3.375 G IVPB 30 MIN
3.3750 g | Freq: Once | INTRAVENOUS | Status: AC
  Administered 2018-04-15: 3.375 g via INTRAVENOUS
  Filled 2018-04-15: qty 50

## 2018-04-15 MED ORDER — ONDANSETRON HCL 4 MG PO TABS: 4.0000 mg | ORAL_TABLET | Freq: Four times a day (QID) | ORAL | Status: DC | PRN

## 2018-04-15 MED ORDER — PROSIGHT PO TABS
1.0000 | ORAL_TABLET | Freq: Every day | ORAL | Status: DC
  Administered 2018-04-15 – 2018-04-23 (×8): 1 via ORAL
  Filled 2018-04-15 (×8): qty 1

## 2018-04-15 MED ORDER — METOPROLOL SUCCINATE ER 25 MG PO TB24
25.0000 mg | ORAL_TABLET | Freq: Every day | ORAL | Status: DC
  Administered 2018-04-15 – 2018-04-23 (×9): 25 mg via ORAL
  Filled 2018-04-15 (×9): qty 1

## 2018-04-15 MED ORDER — DOXAZOSIN MESYLATE 4 MG PO TABS
4.0000 mg | ORAL_TABLET | Freq: Every day | ORAL | Status: DC
  Administered 2018-04-15 – 2018-04-23 (×8): 4 mg via ORAL
  Filled 2018-04-15 (×10): qty 1

## 2018-04-15 MED ORDER — POTASSIUM CHLORIDE 10 MEQ/100ML IV SOLN
10.0000 meq | INTRAVENOUS | Status: AC
  Administered 2018-04-15: 10 meq via INTRAVENOUS
  Filled 2018-04-15: qty 100

## 2018-04-15 NOTE — H&P (Signed)
History and Physical    Vincent Barrett ZOX:[id number] DOB: [birth year] DOA: 04/14/2018  PCP: [name], MD  Patient coming from: home    I have personally briefly reviewed patient's old medical records available.   Chief Complaint: abdominal pain   HPI: Vincent Barrett is a 85 y.o. male with medical history significant of hypertension, paroxysmal A. Fib on Eliquis, chronic kidney disease stage III, BPH who is presenting to the emergency room with abdominal pain of 2 days.  Patient is hard of hearing.  He states that it started hurting about 2 days ago, mostly in the epigastrium and right upper quadrant, no radiation, moderate in intensity, associated with nausea but no vomiting.  No fever chills.  No change in bowel movements.  No similar pain in the past. Patient denies any chest pain.  Denies any palpitations, shortness of breath.  He denies any anginal pain. ED Course: Afebrile in the ER.  Blood pressures are stable.  WBC count 13.8.  Creatinine is 1.54 which is at about his baseline.  LFTs elevated with bilirubin of 3.4.  Right upper quadrant ultrasound shows slightly dilated CBD, pericholecystic fluid.  Patient received 1 dose of Zosyn in the ER.  Patient received IV fluids, antibiotics.  Surgery was consulted. Patient will need GI evaluation.   Review of Systems: As per HPI otherwise 10 point review of systems negative.    Past Medical History:  Diagnosis Date  . BPH (benign prostatic hyperplasia)   . COPD (chronic obstructive pulmonary disease) (HCC)   . Hypertension   . OA (osteoarthritis)   . PAF (paroxysmal atrial fibrillation) (HCC)     Past Surgical History:  Procedure Laterality Date  . CARDIOVERSION N/A 03/31/2017   Procedure: CARDIOVERSION;  Surgeon: Lars Masson, MD;  Location: [facility] ENDOSCOPY;  Service: Cardiovascular;  Laterality: N/A;  . CARPAL TUNNEL RELEASE  02/2010   Dr. Franky Macho  . NM MYOCAR PERF WALL MOTION  08/08/2011   lexiscan; normal pattern of perfusion in  all regions; post-stress EF 69%; low risk scan   . TRANSTHORACIC ECHOCARDIOGRAM  2013   EF 52%, mild AV leaflet thickening; trace TR     reports that he quit smoking about 61 years ago. His smoking use included cigarettes. He has a 22.00 pack-year smoking history. He has never used smokeless tobacco. He reports that he does not drink alcohol or use drugs.  Allergies  Allergen Reactions  . Quinine Nausea And Vomiting    History reviewed. No pertinent family history.   Prior to Admission medications   Medication Sig Start Date End Date Taking? Authorizing Provider  amLODipine (NORVASC) 2.5 MG tablet Take 1 tablet (2.5 mg total) by mouth daily. 02/21/16  Yes Hilty, Lisette Abu, MD  apixaban (ELIQUIS) 5 MG TABS tablet Take 1 tablet (5 mg total) by mouth 2 (two) times daily. LAST REFILL UNTIL F/U WITH CARDIOLOGIST. 02/17/18  Yes Hilty, Lisette Abu, MD  Ascorbic Acid (VITAMIN C) 1000 MG tablet Take 1,000 mg by mouth daily.   Yes [provider]  doxazosin (CARDURA) 4 MG tablet Take 1 tablet (4 mg total) by mouth daily. KEEP OV. 01/16/17  Yes Hilty, Lisette Abu, MD  losartan-hydrochlorothiazide (HYZAAR) 100-25 MG tablet Take 1 tablet by mouth daily. 01/18/18  Yes Hilty, Lisette Abu, MD  metoprolol succinate (TOPROL-XL) 25 MG 24 hr tablet TAKE 1 TABLET (25 MG TOTAL) BY MOUTH DAILY. 04/13/17  Yes Hilty, Lisette Abu, MD  Multiple Vitamins-Minerals (PRESERVISION AREDS 2) CAPS Take 1 capsule by  mouth daily.   Yes [provider]    Physical Exam: Vitals:   04/15/18 0415 04/15/18 0430 04/15/18 0500 04/15/18 0530  BP: (!) 161/66 (!) 159/67 (!) 159/59 (!) 151/60  Pulse:   99 99  Resp: (!) 26 (!) 27 (!) 23 (!) 25  Temp:      TempSrc:      SpO2:   94% 94%    Constitutional: NAD, calm, comfortable Vitals:   04/15/18 0415 04/15/18 0430 04/15/18 0500 04/15/18 0530  BP: (!) 161/66 (!) 159/67 (!) 159/59 (!) 151/60  Pulse:   99 99  Resp: (!) 26 (!) 27 (!) 23 (!) 25  Temp:      TempSrc:        SpO2:   94% 94%   Eyes: PERRL, lids and conjunctivae normal Comfortable.  Hard of hearing. ENMT: Mucous membranes are moist. Posterior pharynx clear of any exudate or lesions.Normal dentition.  Neck: normal, supple, no masses, no thyromegaly Respiratory: clear to auscultation bilaterally, no wheezing, no crackles. Normal respiratory effort. No accessory muscle use.  Cardiovascular: Irregularly irregular rate and rhythm, no murmurs / rubs / gallops. No extremity edema. 2+ pedal pulses. No carotid bruits.  Abdomen: Mild tenderness right upper quadrant, no rigidity or guarding .No hepatosplenomegaly. Bowel sounds positive.  Musculoskeletal: no clubbing / cyanosis. No joint deformity upper and lower extremities. Good ROM, no contractures. Normal muscle tone.  Skin: no rashes, lesions, ulcers. No induration Neurologic: CN 2-12 grossly intact. Sensation intact, DTR normal. Strength 5/5 in all 4.  Psychiatric: Normal judgment and insight. Alert and oriented x 3. Normal mood.     Labs on Admission: I have personally reviewed following labs and imaging studies  CBC: Recent Labs  Lab 04/14/18 1919  WBC 13.8*  HGB 13.7  HCT 39.8  MCV 89.6  PLT 225   Basic Metabolic Panel: Recent Labs  Lab 04/14/18 1919  NA 134*  K 3.2*  CL 99  CO2 21*  GLUCOSE 168*  BUN 21  CREATININE 1.54*  CALCIUM 9.3   GFR: CrCl cannot be calculated (Unknown ideal weight.). Liver Function Tests: Recent Labs  Lab 04/14/18 1919  AST 284*  ALT 347*  ALKPHOS 158*  BILITOT 3.4*  PROT 7.7  ALBUMIN 3.9   Recent Labs  Lab 04/14/18 1919  LIPASE 23   No results for input(s): AMMONIA in the last 168 hours. Coagulation Profile: No results for input(s): INR, PROTIME in the last 168 hours. Cardiac Enzymes: No results for input(s): CKTOTAL, CKMB, CKMBINDEX, TROPONINI in the last 168 hours. BNP (last 3 results) No results for input(s): PROBNP in the last 8760 hours. HbA1C: No results for input(s): HGBA1C  in the last 72 hours. CBG: No results for input(s): GLUCAP in the last 168 hours. Lipid Profile: No results for input(s): CHOL, HDL, LDLCALC, TRIG, CHOLHDL, LDLDIRECT in the last 72 hours. Thyroid Function Tests: No results for input(s): TSH, T4TOTAL, FREET4, T3FREE, THYROIDAB in the last 72 hours. Anemia Panel: No results for input(s): VITAMINB12, FOLATE, FERRITIN, TIBC, IRON, RETICCTPCT in the last 72 hours. Urine analysis:    Component Value Date/Time   COLORURINE YELLOW 04/15/2018 0356   APPEARANCEUR HAZY (A) 04/15/2018 0356   LABSPEC 1.040 (H) 04/15/2018 0356   PHURINE 5.0 04/15/2018 0356   GLUCOSEU NEGATIVE 04/15/2018 0356   HGBUR SMALL (A) 04/15/2018 0356   BILIRUBINUR NEGATIVE 04/15/2018 0356   KETONESUR 5 (A) 04/15/2018 0356   PROTEINUR 30 (A) 04/15/2018 0356   NITRITE NEGATIVE 04/15/2018 0356  LEUKOCYTESUR MODERATE (A) 04/15/2018 0356    Radiological Exams on Admission: Ct Abdomen Pelvis W Contrast  Addendum Date: 04/15/2018   ADDENDUM REPORT: 04/15/2018 03:42 ADDENDUM: Additional clinical information, jaundice. The patient's CT images are reviewed. Extrahepatic bile duct is slightly enlarged, measuring up to 9 mm. Questionable density within the distal duct at the head of pancreas. Recommend correlation with LFTs. MRCP could be obtained if concern for ductal obstruction is given history of jaundice Electronically Signed   By: [name] M.D.   On: 04/15/2018 03:42   Result Date: 04/15/2018 CLINICAL DATA:  Abdominal pain with nausea and vomiting EXAM: CT ABDOMEN AND PELVIS WITH CONTRAST TECHNIQUE: Multidetector CT imaging of the abdomen and pelvis was performed using the standard protocol following bolus administration of intravenous contrast. CONTRAST:  48mL OMNIPAQUE IOHEXOL 300 MG/ML  SOLN COMPARISON:  PET CT 01/08/2018, CT 12/30/2017 FINDINGS: Lower chest: Lung bases again demonstrate oval subpleural mass in the left lower lobe, this measures 4.1 x 2.2 cm and appears  slightly increased in size. No significant pleural effusion. Small hiatal hernia. Hepatobiliary: Cyst at the dome of the liver. Calcified stone at the neck of the gallbladder. Suspected edema or fluid adjacent to the gallbladder fundus. No biliary dilatation Pancreas: Unremarkable. No pancreatic ductal dilatation or surrounding inflammatory changes. Spleen: Normal in size without focal abnormality. Adrenals/Urinary Tract: Adrenal glands are unremarkable. Kidneys are normal, without renal calculi, focal lesion, or hydronephrosis. Bladder is unremarkable. Stomach/Bowel: The stomach is nonenlarged. No dilated small bowel. No colon wall thickening. Negative appendix. Colon diverticular disease without acute inflammatory change. Vascular/Lymphatic: Moderate aortic atherosclerosis. No aneurysm. No significantly enlarged lymph nodes. Reproductive: Enlarged heterogeneous prostate with calcification Other: Negative for free air or free fluid Musculoskeletal: Multiple level degenerative changes of the spine. Chronic compression deformity L1. IMPRESSION: 1. 2.3 cm calcified stone at the gallbladder neck. Suspected gallbladder wall edema versus small amount of pericholecystic fluid. Suggest correlation with ultrasound. 2. Slight interval increase in size of subpleural left lower lobe lung mass now measuring 4.1 cm. 3. Diverticular disease of the colon without acute inflammatory change Electronically Signed: By: [name] M.D. On: 04/15/2018 02:38   US Abdomen Limited  Result Date: 04/15/2018 CLINICAL DATA:  Jaundice EXAM: ULTRASOUND ABDOMEN LIMITED RIGHT UPPER QUADRANT COMPARISON:  CT 04/15/2018 FINDINGS: Gallbladder: Shadowing stone within the gallbladder neck measuring 1.9 cm. Increased wall thickness of 8.8 mm with trace pericholecystic fluid. Negative sonographic Murphy. Small amount of intraluminal sludge. Common bile duct: Diameter: 4.9 mm Liver: Heterogeneous echotexture without focal hepatic abnormality. Portal  vein is patent on color Doppler imaging with normal direction of blood flow towards the liver. IMPRESSION: 1. Gallstone with increased wall thickness and small amount of pericholecystic fluid, raising concern for cholecystitis despite negative sonographic Murphy. Electronically Signed   By: [name] M.D.   On: 04/15/2018 03:39    EKG: Independently reviewed.  A flutter with 3-1 AV conduction, prolonged QTC.  Assessment/Plan Principal Problem:   Abdominal pain Active Problems:   Atrial fibrillation (HCC)   Essential hypertension   COPD (chronic obstructive pulmonary disease) (HCC)   On anticoagulant therapy   Hypokalemia   CKD (chronic kidney disease) stage 3, GFR 30-59 ml/min (HCC)     1.  Abdominal pain with abnormal LFTs, cholelithiasis with choledocholithiasis: With obstructive choledocholithiasis pattern.  Admit.  N.p.o.  IV fluids.  Given a dose of Zosyn, will hold off on further antibiotics.  Will consult GI, possibly will need MRCP versus ERCP.  Patient has  taken Eliquis yesterday morning, may wait for 48 hours for surgical procedure. Adequate pain medications with IV and oral opiates for pain relief.  2.  Paroxysmal A. fib flutter: Currently on a flutter.  Rate controlled.  On beta-blockers will continue.  Hold Eliquis.  Surgery requested cardiology evaluation before surgery.  I discussed with cardiology for consultation.  3.  Hypertension: Fairly stable.  Holding losartan hydrochlorothiazide preop, will continue beta-blockers and doxazosin.  Blood pressures are stable.  4.  Chronic kidney disease stage III: More or less at his usual levels.  We will continue to monitor on treatment.  5.  Hypokalemia: Replaced by mouth.  Will recheck in the morning.  Patient will need inpatient treatment including procedures, surgery, IV fluids and IV pain medications.  Anticipate he will stay more than 2 midnights in the hospital.  DVT prophylaxis: On Eliquis at home, SCDs Code Status:  Full code Family Communication: No family at bedside Disposition Plan: Home when is stable Consults called: Surgery, GI, cardiology Admission status: Inpatient.   Dorcas Carrow MD [facility] Pager [phone]  If 7PM-7AM, please contact night-coverage [email or web address] Password [email or web address]  04/15/2018, 7:40 AM

## 2018-04-15 NOTE — Progress Notes (Signed)
Received report from [place], Charity fundraiser.

## 2018-04-15 NOTE — ED Notes (Addendum)
ED TO INPATIENT HANDOFF REPORT  ED Nurse Name and Phone #: Enriqueta Shutter [phone]  S Name/Age/Gender Vincent Barrett 85 y.o. male Room/Bed: 026C/026C  Code Status   Code Status: Not on file  Home/SNF/Other Home Patient oriented to: situation Is this baseline? Yes   Triage Complete: Triage complete  Chief Complaint Stomach Pain/Swelling/Tenderness  Triage Note Pt c/o abdominal pain with nausea and vomiting. Denies diarrhea, no urinary symptoms.     Allergies Allergies  Allergen Reactions  . Quinine Nausea And Vomiting    Level of Care/Admitting Diagnosis ED Disposition    ED Disposition Condition Comment   Admit  Hospital Area: [facility] [[id number]]  Level of Care: Medical Telemetry [104]  Diagnosis: Abdominal pain [[id number]]  Admitting Physician: Eduard Clos [[id number]]  Attending Physician: Eduard Clos [phone]  Estimated length of stay: past midnight tomorrow  Certification:: I certify this patient will need inpatient services for at least 2 midnights  PT Class (Do Not Modify): Inpatient [101]  PT Acc Code (Do Not Modify): Private [1]       B Medical/Surgery History Past Medical History:  Diagnosis Date  . BPH (benign prostatic hyperplasia)   . COPD (chronic obstructive pulmonary disease) (HCC)   . Hypertension   . OA (osteoarthritis)   . PAF (paroxysmal atrial fibrillation) (HCC)    Past Surgical History:  Procedure Laterality Date  . CARDIOVERSION N/A 03/31/2017   Procedure: CARDIOVERSION;  Surgeon: Lars Masson, MD;  Location: [facility] ENDOSCOPY;  Service: Cardiovascular;  Laterality: N/A;  . CARPAL TUNNEL RELEASE  02/2010   Dr. Franky Macho  . NM MYOCAR PERF WALL MOTION  08/08/2011   lexiscan; normal pattern of perfusion in all regions; post-stress EF 69%; low risk scan   . TRANSTHORACIC ECHOCARDIOGRAM  2013   EF 52%, mild AV leaflet thickening; trace TR     A IV Location/Drains/Wounds Patient Lines/Drains/Airways Status    Active Line/Drains/Airways    Name:   Placement date:   Placement time:   Site:   Days:   Peripheral IV 04/15/18 Right Antecubital   04/15/18    0156    Antecubital   less than 1          Intake/Output Last 24 hours  Intake/Output Summary (Last 24 hours) at 04/15/2018 0744 Last data filed at 04/15/2018 0555 Gross per 24 hour  Intake -  Output 200 ml  Net -200 ml    Labs/Imaging Results for orders placed or performed during the hospital encounter of 04/14/18 (from the past 48 hour(s))  Lipase, blood     Status: None   Collection Time: 04/14/18  7:19 PM  Result Value Ref Range   Lipase 23 11 - 51 U/L    Comment: Performed at [facility], [address], [place], [place] [address]  Comprehensive metabolic panel     Status: Abnormal   Collection Time: 04/14/18  7:19 PM  Result Value Ref Range   Sodium 134 (L) 135 - 145 mmol/L   Potassium 3.2 (L) 3.5 - 5.1 mmol/L   Chloride 99 98 - 111 mmol/L   CO2 21 (L) 22 - 32 mmol/L   Glucose, Bld 168 (H) 70 - 99 mg/dL   BUN 21 8 - 23 mg/dL   Creatinine, Ser 7.12 (H) 0.61 - 1.24 mg/dL   Calcium 9.3 8.9 - 19.7 mg/dL   Total Protein 7.7 6.5 - 8.1 g/dL   Albumin 3.9 3.5 - 5.0 g/dL   AST 588 (H) 15 -  41 U/L   ALT 347 (H) 0 - 44 U/L   Alkaline Phosphatase 158 (H) 38 - 126 U/L   Total Bilirubin 3.4 (H) 0.3 - 1.2 mg/dL   GFR calc non Af Amer 39 (L) >60 mL/min   GFR calc Af Amer 45 (L) >60 mL/min   Anion gap 14 5 - 15    Comment: Performed at [facility], [address], [place], [place] [address]  CBC     Status: Abnormal   Collection Time: 04/14/18  7:19 PM  Result Value Ref Range   WBC 13.8 (H) 4.0 - 10.5 K/uL   RBC 4.44 4.22 - 5.81 MIL/uL   Hemoglobin 13.7 13.0 - 17.0 g/dL   HCT 19.1 47.8 - 29.5 %   MCV 89.6 80.0 - 100.0 fL   MCH 30.9 26.0 - 34.0 pg   MCHC 34.4 30.0 - 36.0 g/dL   RDW 62.1 30.8 - 65.7 %   Platelets 225 150 - 400 K/uL   nRBC 0.0 0.0 - 0.2 %    Comment: Performed at [facility], [address] [place], [place] [address]  POC occult blood, ED Provider will collect     Status: None   Collection Time: 04/15/18  1:49 AM  Result Value Ref Range   Fecal Occult Bld NEGATIVE NEGATIVE  Urinalysis, Routine w reflex microscopic     Status: Abnormal   Collection Time: 04/15/18  3:56 AM  Result Value Ref Range   Color, Urine YELLOW YELLOW   APPearance HAZY (A) CLEAR   Specific Gravity, Urine 1.040 (H) 1.005 - 1.030   pH 5.0 5.0 - 8.0   Glucose, UA NEGATIVE NEGATIVE mg/dL   Hgb urine dipstick SMALL (A) NEGATIVE   Bilirubin Urine NEGATIVE NEGATIVE   Ketones, ur 5 (A) NEGATIVE mg/dL   Protein, ur 30 (A) NEGATIVE mg/dL   Nitrite NEGATIVE NEGATIVE   Leukocytes,Ua MODERATE (A) NEGATIVE   RBC / HPF 6-10 0 - 5 RBC/hpf   WBC, UA >50 (H) 0 - 5 WBC/hpf   Bacteria, UA NONE SEEN NONE SEEN   Squamous Epithelial / LPF 0-5 0 - 5   Mucus PRESENT     Comment: Performed at [facility], [address] [place], [place] [address]   Ct Abdomen Pelvis W Contrast  Addendum Date: 04/15/2018   ADDENDUM REPORT: 04/15/2018 03:42 ADDENDUM: Additional clinical information, jaundice. The patient's CT images are reviewed. Extrahepatic bile duct is slightly enlarged, measuring up to 9 mm. Questionable density within the distal duct at the head of pancreas. Recommend correlation with LFTs. MRCP could be obtained if concern for ductal obstruction is given history of jaundice Electronically Signed   By: [name] M.D.   On: 04/15/2018 03:42   Result Date: 04/15/2018 CLINICAL DATA:  Abdominal pain with nausea and vomiting EXAM: CT ABDOMEN AND PELVIS WITH CONTRAST TECHNIQUE: Multidetector CT imaging of the abdomen and pelvis was performed using the standard protocol following bolus administration of intravenous contrast. CONTRAST:  80mL OMNIPAQUE IOHEXOL 300 MG/ML  SOLN COMPARISON:  PET CT 01/08/2018, CT 12/30/2017 FINDINGS: Lower chest: Lung bases again demonstrate oval subpleural mass in the left lower lobe, this  measures 4.1 x 2.2 cm and appears slightly increased in size. No significant pleural effusion. Small hiatal hernia. Hepatobiliary: Cyst at the dome of the liver. Calcified stone at the neck of the gallbladder. Suspected edema or fluid adjacent to the gallbladder fundus. No biliary dilatation Pancreas: Unremarkable. No pancreatic  ductal dilatation or surrounding inflammatory changes. Spleen: Normal in size without focal abnormality. Adrenals/Urinary Tract: Adrenal glands are unremarkable. Kidneys are normal, without renal calculi, focal lesion, or hydronephrosis. Bladder is unremarkable. Stomach/Bowel: The stomach is nonenlarged. No dilated small bowel. No colon wall thickening. Negative appendix. Colon diverticular disease without acute inflammatory change. Vascular/Lymphatic: Moderate aortic atherosclerosis. No aneurysm. No significantly enlarged lymph nodes. Reproductive: Enlarged heterogeneous prostate with calcification Other: Negative for free air or free fluid Musculoskeletal: Multiple level degenerative changes of the spine. Chronic compression deformity L1. IMPRESSION: 1. 2.3 cm calcified stone at the gallbladder neck. Suspected gallbladder wall edema versus small amount of pericholecystic fluid. Suggest correlation with ultrasound. 2. Slight interval increase in size of subpleural left lower lobe lung mass now measuring 4.1 cm. 3. Diverticular disease of the colon without acute inflammatory change Electronically Signed: By: [name] M.D. On: 04/15/2018 02:38   US Abdomen Limited  Result Date: 04/15/2018 CLINICAL DATA:  Jaundice EXAM: ULTRASOUND ABDOMEN LIMITED RIGHT UPPER QUADRANT COMPARISON:  CT 04/15/2018 FINDINGS: Gallbladder: Shadowing stone within the gallbladder neck measuring 1.9 cm. Increased wall thickness of 8.8 mm with trace pericholecystic fluid. Negative sonographic Murphy. Small amount of intraluminal sludge. Common bile duct: Diameter: 4.9 mm Liver: Heterogeneous echotexture without  focal hepatic abnormality. Portal vein is patent on color Doppler imaging with normal direction of blood flow towards the liver. IMPRESSION: 1. Gallstone with increased wall thickness and small amount of pericholecystic fluid, raising concern for cholecystitis despite negative sonographic Murphy. Electronically Signed   By: [name] M.D.   On: 04/15/2018 03:39    Pending Labs [employer] (From admission, onward)    Start     Ordered   Signed and Held  Comprehensive metabolic panel  Tomorrow morning,   R     Signed and Held   Signed and Held  CBC  Tomorrow morning,   R     Signed and Held          Vitals/Pain Today's Vitals   04/15/18 0500 04/15/18 0530 04/15/18 0715 04/15/18 0730  BP: (!) 159/59 (!) 151/60 (!) 131/94 (!) 153/67  Pulse: 99 99 (!) 101 (!) 101  Resp: (!) 23 (!) 25 (!) 23 (!) 27  Temp:      TempSrc:      SpO2: 94% 94% 94% 94%  PainSc:        Isolation Precautions No active isolations  Medications Medications  sodium chloride flush (NS) 0.9 % injection 3 mL (has no administration in time range)  potassium chloride 10 mEq in 100 mL IVPB (10 mEq Intravenous New Bag/Given 04/15/18 0656)  0.9 %  sodium chloride infusion (has no administration in time range)  sodium chloride 0.9 % bolus 1,000 mL (0 mLs Intravenous Stopped 04/15/18 0303)  morphine 4 MG/ML injection 4 mg (4 mg Intravenous Given 04/15/18 0203)  ondansetron (ZOFRAN) injection 4 mg (4 mg Intravenous Given 04/15/18 0203)  iohexol (OMNIPAQUE) 300 MG/ML solution 80 mL (80 mLs Intravenous Contrast Given 04/15/18 0216)  piperacillin-tazobactam (ZOSYN) IVPB 3.375 g (0 g Intravenous Stopped 04/15/18 0514)    Mobility walks Low fall risk   Focused Assessments Gastrointestinal   R Recommendations: See Admitting Provider Note  Report given to:   Additional Notes:  Hard of hearing, hearing aid batteries have died. Have to talk loudly.

## 2018-04-15 NOTE — ED Notes (Signed)
Attempted to call report

## 2018-04-15 NOTE — Consult Note (Signed)
Cardiology Consult    Patient ID: Rhandy Morelock MRN: [id number], DOB/AGE: [birth year]   Admit date: 04/14/2018 Date of Consult: 04/15/2018  Primary Physician: [name], MD Primary Cardiologist: Chrystie Nose, MD Requesting Provider: Willis Modena, MD  Patient Profile    Ousmane Mensing is a 85 y.o. male with a history of paroxysmal atrial fibrillation/flutter on Eliquis, hypertension, COPD, and CKD stage III who was admitted for abdominal pain and found to have acute cholecystitis. Cardiology was consulted for pre-operative evaluation at the request of at the request of Dr. Dulce Sellar.  History of Present Illness    Mr. Mesecher is a 85 year old male with the above history who is followed by Dr. Rennis Golden. He was first diagnosed with atrial fibrillation in 2013 and was maintained on Aspirin for several years due to the relative infrequency of these episodes. However, in 02/2017 was found to be in atrial flutter and was started on Eliquis. Patient underwent successful cardioversion in 03/2017; however ultimately returned to atrial flutter. He was last seen by Dr. Rennis Golden on 03/17/2018 for routine follow-up and remained in atrial flutter at that time but denied any cardiac symptoms.   Patient was admitted yesterday after presented with abdominal pain. Imagining and labs are suspicious for acute cholecystitis. GI was consulted and has planned to perform MRCP. Cardiology was consulted for pre-operative evaluation for possible cholecystectomy vs. percutaneous drain.   Patient is very hard of hearing and the battery in his hearing aides is currently dead. I was able to gather a history by speaking very loudly and by talking to his wife. He started having severe abdominal pain 2 days ago with nausea and vomiting. He denies any diarrhea but has noticed some dark stools recently. Patient reports some mild chest pain that he describes as a pressure and a sore throat that he noticed after he started vomiting. He denies any  chest pain prior to this. He does have some shortness of breath with exertion but he has had that for a while. He is reports some shortness of breath at night while laying flat but no PND. He has chronic swelling in his right lower extremity but denies any other abnormal swelling. He states his heart has always skipped a beat every since he was young but denies really being aware that he is in atrial flutter. He has some dizziness with quick position changes but this is not new. Patient has good functional capacity and is able to full take care of himself. He ambulates with a cane and feels unsteady on his feet but is able to walk indoors, climb the 4 stairs in his house, and do light work around the house (METS > 4.0).   Currently, patient continues to have significant abdominal pain but denies any chest pain.   Past Medical History   Past Medical History:  Diagnosis Date  . BPH (benign prostatic hyperplasia)   . COPD (chronic obstructive pulmonary disease) (HCC)   . Hypertension   . OA (osteoarthritis)   . PAF (paroxysmal atrial fibrillation) (HCC)     Past Surgical History:  Procedure Laterality Date  . CARDIOVERSION N/A 03/31/2017   Procedure: CARDIOVERSION;  Surgeon: Lars Masson, MD;  Location: [facility] ENDOSCOPY;  Service: Cardiovascular;  Laterality: N/A;  . CARPAL TUNNEL RELEASE  02/2010   Dr. Franky Macho  . NM MYOCAR PERF WALL MOTION  08/08/2011   lexiscan; normal pattern of perfusion in all regions; post-stress EF 69%; low risk scan   . TRANSTHORACIC  ECHOCARDIOGRAM  2013   EF 52%, mild AV leaflet thickening; trace TR     Allergies  Allergies  Allergen Reactions  . Quinine Nausea And Vomiting    Inpatient Medications    . amLODipine  2.5 mg Oral Daily  . doxazosin  4 mg Oral Daily  . metoprolol succinate  25 mg Oral Daily  . multivitamin  1 tablet Oral Daily  . sodium chloride flush  3 mL Intravenous Once  . vitamin C  1,000 mg Oral Daily    Family History    Family  History  Problem Relation Age of Onset  . Heart disease Neg Hx    He indicated that the status of his neg hx is unknown.   Social History    Social History   Socioeconomic History  . Marital status: Married    Spouse name: Not on file  . Number of children: 4  . Years of education: Not on file  . Highest education level: Not on file  Occupational History  . Not on file  Social Needs  . Financial resource strain: Not on file  . Food insecurity:    Worry: Not on file    Inability: Not on file  . Transportation needs:    Medical: Not on file    Non-medical: Not on file  Tobacco Use  . Smoking status: Former Smoker    Packs/day: 2.00    Years: 11.00    Pack years: 22.00    Types: Cigarettes    Last attempt to quit: 02/10/1957    Years since quitting: 61.2  . Smokeless tobacco: Never Used  Substance and Sexual Activity  . Alcohol use: No  . Drug use: No  . Sexual activity: Not on file  Lifestyle  . Physical activity:    Days per week: Not on file    Minutes per session: Not on file  . Stress: Not on file  Relationships  . Social connections:    Talks on phone: Not on file    Gets together: Not on file    Attends religious service: Not on file    Active member of club or organization: Not on file    Attends meetings of clubs or organizations: Not on file    Relationship status: Not on file  . Intimate partner violence:    Fear of current or ex partner: Not on file    Emotionally abused: Not on file    Physically abused: Not on file    Forced sexual activity: Not on file  Other Topics Concern  . Not on file  Social History Narrative   [place] [employer] veteran - [employer]     Review of Systems    Review of Systems  Constitutional: Negative for chills and fever.  HENT: Positive for sore throat. Negative for congestion.   Respiratory: Positive for shortness of breath. Negative for cough and hemoptysis.   Cardiovascular: Positive for chest pain, palpitations, orthopnea  and leg swelling (chronic right leg swelling). Negative for PND.  Gastrointestinal: Positive for abdominal pain, nausea and vomiting. Negative for blood in stool and diarrhea.  Genitourinary: Negative for hematuria.  Musculoskeletal: Negative for myalgias.  Neurological: Positive for dizziness (with quick position changes). Negative for loss of consciousness.  Endo/Heme/Allergies: Does not bruise/bleed easily.  Psychiatric/Behavioral: Negative for substance abuse.    Physical Exam    Blood pressure (!) 145/69, pulse 100, temperature 98.8 F (37.1 C), temperature source Oral, resp. rate 18, SpO2 94 %.  General: 85 y.o. male resting comfortably in no acute distress. Pleasant and cooperative. HEENT: Normal. Very hard of hearing. Neck: Supple. No carotid bruits or JVD appreciated. Lungs: No increased work of breathing. Minimal faint crackles in bases but lungs otherwise clear.  Heart: Borderline tachycardic with regular rhythm. Distinct S1 and S2. No murmurs, gallops, or rubs.  Abdomen: Soft, non-distended, and tender with light palpation of epigastric area. Bowel sounds present in all 4 quadrants.   Extremities: No significant pedal edema. Bilateral SCDs in place. Radial and distal pedal pulses 2+ and equal bilaterally. Skin: Warm and dry. Neuro: Alert and oriented x3. No focal deficits. Moves all extremities spontaneously. Psych: Normal affect.  Labs    Troponin (Point of Care Test) No results for input(s): TROPIPOC in the last 72 hours. No results for input(s): CKTOTAL, CKMB, TROPONINI in the last 72 hours. Lab Results  Component Value Date   WBC 13.8 (H) 04/14/2018   HGB 13.7 04/14/2018   HCT 39.8 04/14/2018   MCV 89.6 04/14/2018   PLT 225 04/14/2018    Recent Labs  Lab 04/14/18 1919  NA 134*  K 3.2*  CL 99  CO2 21*  BUN 21  CREATININE 1.54*  CALCIUM 9.3  PROT 7.7  BILITOT 3.4*  ALKPHOS 158*  ALT 347*  AST 284*  GLUCOSE 168*   No results found for: CHOL, HDL,  LDLCALC, TRIG No results found for: [facility]   Radiology Studies    Ct Abdomen Pelvis W Contrast  Addendum Date: 04/15/2018   ADDENDUM REPORT: 04/15/2018 03:42 ADDENDUM: Additional clinical information, jaundice. The patient's CT images are reviewed. Extrahepatic bile duct is slightly enlarged, measuring up to 9 mm. Questionable density within the distal duct at the head of pancreas. Recommend correlation with LFTs. MRCP could be obtained if concern for ductal obstruction is given history of jaundice Electronically Signed   By: [name] M.D.   On: 04/15/2018 03:42   Result Date: 04/15/2018 CLINICAL DATA:  Abdominal pain with nausea and vomiting EXAM: CT ABDOMEN AND PELVIS WITH CONTRAST TECHNIQUE: Multidetector CT imaging of the abdomen and pelvis was performed using the standard protocol following bolus administration of intravenous contrast. CONTRAST:  80mL OMNIPAQUE IOHEXOL 300 MG/ML  SOLN COMPARISON:  PET CT 01/08/2018, CT 12/30/2017 FINDINGS: Lower chest: Lung bases again demonstrate oval subpleural mass in the left lower lobe, this measures 4.1 x 2.2 cm and appears slightly increased in size. No significant pleural effusion. Small hiatal hernia. Hepatobiliary: Cyst at the dome of the liver. Calcified stone at the neck of the gallbladder. Suspected edema or fluid adjacent to the gallbladder fundus. No biliary dilatation Pancreas: Unremarkable. No pancreatic ductal dilatation or surrounding inflammatory changes. Spleen: Normal in size without focal abnormality. Adrenals/Urinary Tract: Adrenal glands are unremarkable. Kidneys are normal, without renal calculi, focal lesion, or hydronephrosis. Bladder is unremarkable. Stomach/Bowel: The stomach is nonenlarged. No dilated small bowel. No colon wall thickening. Negative appendix. Colon diverticular disease without acute inflammatory change. Vascular/Lymphatic: Moderate aortic atherosclerosis. No aneurysm. No significantly enlarged lymph nodes. Reproductive:  Enlarged heterogeneous prostate with calcification Other: Negative for free air or free fluid Musculoskeletal: Multiple level degenerative changes of the spine. Chronic compression deformity L1. IMPRESSION: 1. 2.3 cm calcified stone at the gallbladder neck. Suspected gallbladder wall edema versus small amount of pericholecystic fluid. Suggest correlation with ultrasound. 2. Slight interval increase in size of subpleural left lower lobe lung mass now measuring 4.1 cm. 3. Diverticular disease of the colon without acute inflammatory change Electronically Signed:  By: [name] M.D. On: 04/15/2018 02:38   US Abdomen Limited  Result Date: 04/15/2018 CLINICAL DATA:  Jaundice EXAM: ULTRASOUND ABDOMEN LIMITED RIGHT UPPER QUADRANT COMPARISON:  CT 04/15/2018 FINDINGS: Gallbladder: Shadowing stone within the gallbladder neck measuring 1.9 cm. Increased wall thickness of 8.8 mm with trace pericholecystic fluid. Negative sonographic Murphy. Small amount of intraluminal sludge. Common bile duct: Diameter: 4.9 mm Liver: Heterogeneous echotexture without focal hepatic abnormality. Portal vein is patent on color Doppler imaging with normal direction of blood flow towards the liver. IMPRESSION: 1. Gallstone with increased wall thickness and small amount of pericholecystic fluid, raising concern for cholecystitis despite negative sonographic Murphy. Electronically Signed   By: [name] M.D.   On: 04/15/2018 03:39    EKG     EKG: EKG was personally reviewed and demonstrates: Atrial flutter with ventricular rate of 98 and no acute ST/T changes compared to prior tracings.   Telemetry: Telemetry was personally reviewed and demonstrates: Atrial flutter with rates mostly in the low 100's.  Cardiac Imaging    None.  Assessment & Plan    Pre-Operative Evaluation - Patient presented with abdominal pain, nausea, and vomiting. Labs and imaging suspicious for cholecystitis. GI requested Cardiology consult for  pre-operative evaluation for possible cholecystectomy vs. percutaneous drain. Patient has a history of atrial fibrillation/flutter but no known CAD. Patient reports some chest pain with the vomiting but denies any chest pain prior to that.  - EKG showed atrial flutter of 98 bpm and no acute ST/T changes compared to prior tracings.  - Telemetry shows atrial flutter with rates mostly in the low 100's. - Patient has good functional capacity, especially considering his advanced age. He is able to complete all of his activities of daily living independently and able to walk indoors, climb the 4 stairs in his house, and do light and moderate work around the house. Patient ambulates with a cane and his biggest limiting factor to activity is that he feels very unsteady on his feet. METs >4.0. - Revised Cardiac Risk Index is low risk but this does not take into account the patient advanced age.    Atrial Fibrillation/Flutter - Currently in atrial fibrillation with rates mostly in the low 100's (with occasional brief episode in the 120's). Per last office visit note, patient typically bradycardic. Suspect mild tachycardia is secondary to pain.  - Continue home Toprol 25mg  daily. - On chronic anticoagulation with Eliquis 5mg  twice daily. Last dose yesterday morning. This was held on admission in anticipation of surgery. Would recommend restarting as soon as it is safe from a surgical standpoint.  Hypertension - Continue home  Amlodipine 2.5mg  daily, Cardura 4mg  daily, and Toprol 25mg  daily. - Home Losartan-HCTZ 100-25mg  daily held on admission in anticipation of surgery.  Abdominal Pain Suspicious for Cholecystitis - Elevate WBC and LFTs. - Management per primary team and surgery.  CKD Stage III - Serum creatinine 1.54 on admission, which seems to be around his baseline. - Will continue to monitor.    Signed, Corrin Parker, PA-C 04/15/2018, 4:50 PM  For questions or updates, please contact     Please consult [email or web address] for contact info under Cardiology/STEMI.  History and all data above reviewed.  Patient examined.  I agree with the findings as above.  The patient has no high risk findings.  He has no SOB.     He has a high functional level.  The patient denies any new symptoms such as chest discomfort, neck or arm discomfort.  There has been no new shortness of breath, PND or orthopnea. There have been no reported palpitations, presyncope or syncope.  The patient exam reveals ZOX:[id number]:Irregular  ,  Lungs: Positive bowel sounds, no rebound no guarding  ,  WJX:[id number]:Decreased bowel sounds and tender to palpation., Ext No edema  .  All available labs, radiology testing, previous records reviewed. Agree with documented assessment and plan.   PREOP:  As above and per the ACC/AHA guidelines the patient is at acceptable risk for the planned surgery.  No further testing is indicated.  OK to hold DOAC as needed.  Resume post op when OK from a cardiovascular standpoint.  Please call us with further questions.    Fayrene FearingJames Labaron Digirolamo  5:17 PM  04/15/2018

## 2018-04-15 NOTE — ED Provider Notes (Signed)
[facility] EMERGENCY DEPARTMENT Provider Note   CSN: [id number] Arrival date & time: 04/14/18  1827    History   Chief Complaint Chief Complaint  Patient presents with  . Abdominal Pain    HPI Vincent Barrett is a 85 y.o. male.   The history is provided by the patient.  Abdominal Pain  He has history of hypertension, COPD, paroxysmal atrial fibrillation and is anticoagulated on apixaban.  He comes in complaining of upper abdominal pain for the last 3 days.  He describes it as cramping pain without radiation.  It is getting worse.  He is unable to put a number on it.  Pain is not affected by eating.  There is associated nausea and vomiting and pain is worse after vomiting.  He states he has had black bowel movements today.  He denies taking any Pepto-Bismol or similar medication.  He has never had pain like this before.  There is been no fever or chills.  Past Medical History:  Diagnosis Date  . BPH (benign prostatic hyperplasia)   . COPD (chronic obstructive pulmonary disease) (HCC)   . Hypertension   . OA (osteoarthritis)   . PAF (paroxysmal atrial fibrillation) [facility])     Patient Active Problem List   Diagnosis Date Noted  . Solitary pulmonary nodule 09/04/2017  . Upper airway cough syndrome 09/03/2017  . On anticoagulant therapy 03/25/2017  . Atrial flutter (HCC) 02/20/2017  . Leg edema, right 02/21/2016  . Constipation 02/19/2015  . Atrial fibrillation (HCC) 02/17/2013  . Essential hypertension 02/17/2013  . COPD (chronic obstructive pulmonary disease) (HCC) 02/17/2013    Past Surgical History:  Procedure Laterality Date  . CARDIOVERSION N/A 03/31/2017   Procedure: CARDIOVERSION;  Surgeon: Lars Masson, MD;  Location: [facility] ENDOSCOPY;  Service: Cardiovascular;  Laterality: N/A;  . CARPAL TUNNEL RELEASE  02/2010   Dr. Franky Macho  . NM MYOCAR PERF WALL MOTION  08/08/2011   lexiscan; normal pattern of perfusion in all regions; post-stress EF 69%; low  risk scan   . TRANSTHORACIC ECHOCARDIOGRAM  2013   EF 52%, mild AV leaflet thickening; trace TR        Home Medications    Prior to Admission medications   Medication Sig Start Date End Date Taking? Authorizing Provider  amLODipine (NORVASC) 2.5 MG tablet Take 1 tablet (2.5 mg total) by mouth daily. 02/21/16   Hilty, Lisette Abu, MD  apixaban (ELIQUIS) 5 MG TABS tablet Take 1 tablet (5 mg total) by mouth 2 (two) times daily. LAST REFILL UNTIL F/U WITH CARDIOLOGIST. 02/17/18   Chrystie Nose, MD  Ascorbic Acid (VITAMIN C) 1000 MG tablet Take 1,000 mg by mouth daily.    [provider]  chlorpheniramine (CHLOR-TRIMETON) 4 MG tablet Take 4 mg by mouth every 4 (four) hours as needed for allergies.    [provider]  doxazosin (CARDURA) 4 MG tablet Take 1 tablet (4 mg total) by mouth daily. KEEP OV. 01/16/17   Hilty, Lisette Abu, MD  losartan-hydrochlorothiazide (HYZAAR) 100-25 MG tablet Take 1 tablet by mouth daily. 01/18/18   Hilty, Lisette Abu, MD  metoprolol succinate (TOPROL-XL) 25 MG 24 hr tablet TAKE 1 TABLET (25 MG TOTAL) BY MOUTH DAILY. 04/13/17   Hilty, Lisette Abu, MD  Multiple Vitamins-Minerals (PRESERVISION AREDS 2) CAPS Take 1 capsule by mouth daily.    [provider]    Family History No family history on file.  Social History Social History   Tobacco Use  . Smoking  status: Former Smoker    Packs/day: 2.00    Years: 11.00    Pack years: 22.00    Types: Cigarettes    Last attempt to quit: 02/10/1957    Years since quitting: 61.2  . Smokeless tobacco: Never Used  Substance Use Topics  . Alcohol use: No  . Drug use: No     Allergies   Quinine   Review of Systems Review of Systems  Gastrointestinal: Positive for abdominal pain.  All other systems reviewed and are negative.    Physical Exam Updated Vital Signs BP (!) 161/64 (BP Location: Right Arm)   Pulse 96   Temp 97.9 F (36.6 C) (Oral)   Resp 18   SpO2 96%   Physical Exam Vitals  signs and nursing note reviewed.    85 year old male, resting comfortably and in no acute distress. Vital signs are significant for elevated systolic blood pressure. Oxygen saturation is 96%, which is normal. Head is normocephalic and atraumatic. PERRLA, EOMI. Oropharynx is clear.  Faint scleral icterus is present. Neck is nontender and supple without adenopathy or JVD. Back is nontender and there is no CVA tenderness. Lungs are clear without rales, wheezes, or rhonchi. Chest is nontender. Heart has regular rate and rhythm without murmur. Abdomen is soft, flat, with moderate tenderness in the epigastric and right upper quadrant areas.  Murphy sign is negative.  There is no rebound or guarding.  Masses or hepatosplenomegaly and peristalsis is hypoactive. Rectal: Normal sphincter tone, brown stool present and sent for Hemoccult testing. Extremities have no cyanosis or edema, full range of motion is present. Skin is warm and dry without rash. Neurologic: Mental status is normal, cranial nerves are intact, there are no motor or sensory deficits.  ED Treatments / Results  Labs (all labs ordered are listed, but only abnormal results are displayed) Labs Reviewed  COMPREHENSIVE METABOLIC PANEL - Abnormal; Notable for the following components:      Result Value   Sodium 134 (*)    Potassium 3.2 (*)    CO2 21 (*)    Glucose, Bld 168 (*)    Creatinine, Ser 1.54 (*)    AST 284 (*)    ALT 347 (*)    Alkaline Phosphatase 158 (*)    Total Bilirubin 3.4 (*)    GFR calc non Af Amer 39 (*)    GFR calc Af Amer 45 (*)    All other components within normal limits  CBC - Abnormal; Notable for the following components:   WBC 13.8 (*)    All other components within normal limits  URINALYSIS, ROUTINE W REFLEX MICROSCOPIC - Abnormal; Notable for the following components:   APPearance HAZY (*)    Specific Gravity, Urine 1.040 (*)    Hgb urine dipstick SMALL (*)    Ketones, ur 5 (*)    Protein, ur 30  (*)    Leukocytes,Ua MODERATE (*)    WBC, UA >50 (*)    All other components within normal limits  LIPASE, BLOOD  POC OCCULT BLOOD, ED    EKG EKG Interpretation  Date/Time:  Wednesday April 14 2018 19:19:47 EST Ventricular Rate:  98 PR Interval:    QRS Duration: 90 QT Interval:  442 QTC Calculation: 564 R Axis:   -45 Text Interpretation:  ** Critical Test Result: Long QTc Atrial flutter with 3:1 A-V conduction Left anterior fascicular block Nonspecific ST abnormality Prolonged QT Abnormal ECG When compared with ECG of 03/31/2017, Atrial flutter has replaced  Complete (3-degree) AV block QT has lengthened Nonspecific ST abnormality is now present Confirmed by Dione Booze ([id number]) on 04/14/2018 10:58:41 PM   Radiology Ct Abdomen Pelvis W Contrast  Addendum Date: 04/15/2018   ADDENDUM REPORT: 04/15/2018 03:42 ADDENDUM: Additional clinical information, jaundice. The patient's CT images are reviewed. Extrahepatic bile duct is slightly enlarged, measuring up to 9 mm. Questionable density within the distal duct at the head of pancreas. Recommend correlation with LFTs. MRCP could be obtained if concern for ductal obstruction is given history of jaundice Electronically Signed   By: [name] M.D.   On: 04/15/2018 03:42   Result Date: 04/15/2018 CLINICAL DATA:  Abdominal pain with nausea and vomiting EXAM: CT ABDOMEN AND PELVIS WITH CONTRAST TECHNIQUE: Multidetector CT imaging of the abdomen and pelvis was performed using the standard protocol following bolus administration of intravenous contrast. CONTRAST:  37mL OMNIPAQUE IOHEXOL 300 MG/ML  SOLN COMPARISON:  PET CT 01/08/2018, CT 12/30/2017 FINDINGS: Lower chest: Lung bases again demonstrate oval subpleural mass in the left lower lobe, this measures 4.1 x 2.2 cm and appears slightly increased in size. No significant pleural effusion. Small hiatal hernia. Hepatobiliary: Cyst at the dome of the liver. Calcified stone at the neck of the gallbladder.  Suspected edema or fluid adjacent to the gallbladder fundus. No biliary dilatation Pancreas: Unremarkable. No pancreatic ductal dilatation or surrounding inflammatory changes. Spleen: Normal in size without focal abnormality. Adrenals/Urinary Tract: Adrenal glands are unremarkable. Kidneys are normal, without renal calculi, focal lesion, or hydronephrosis. Bladder is unremarkable. Stomach/Bowel: The stomach is nonenlarged. No dilated small bowel. No colon wall thickening. Negative appendix. Colon diverticular disease without acute inflammatory change. Vascular/Lymphatic: Moderate aortic atherosclerosis. No aneurysm. No significantly enlarged lymph nodes. Reproductive: Enlarged heterogeneous prostate with calcification Other: Negative for free air or free fluid Musculoskeletal: Multiple level degenerative changes of the spine. Chronic compression deformity L1. IMPRESSION: 1. 2.3 cm calcified stone at the gallbladder neck. Suspected gallbladder wall edema versus small amount of pericholecystic fluid. Suggest correlation with ultrasound. 2. Slight interval increase in size of subpleural left lower lobe lung mass now measuring 4.1 cm. 3. Diverticular disease of the colon without acute inflammatory change Electronically Signed: By: [name] M.D. On: 04/15/2018 02:38   US Abdomen Limited  Result Date: 04/15/2018 CLINICAL DATA:  Jaundice EXAM: ULTRASOUND ABDOMEN LIMITED RIGHT UPPER QUADRANT COMPARISON:  CT 04/15/2018 FINDINGS: Gallbladder: Shadowing stone within the gallbladder neck measuring 1.9 cm. Increased wall thickness of 8.8 mm with trace pericholecystic fluid. Negative sonographic Murphy. Small amount of intraluminal sludge. Common bile duct: Diameter: 4.9 mm Liver: Heterogeneous echotexture without focal hepatic abnormality. Portal vein is patent on color Doppler imaging with normal direction of blood flow towards the liver. IMPRESSION: 1. Gallstone with increased wall thickness and small amount of  pericholecystic fluid, raising concern for cholecystitis despite negative sonographic Murphy. Electronically Signed   By: [name] M.D.   On: 04/15/2018 03:39    Procedures Procedures  Medications Ordered in ED Medications  sodium chloride flush (NS) 0.9 % injection 3 mL (has no administration in time range)  potassium chloride 10 mEq in 100 mL IVPB (has no administration in time range)  sodium chloride 0.9 % bolus 1,000 mL (0 mLs Intravenous Stopped 04/15/18 0303)  morphine 4 MG/ML injection 4 mg (4 mg Intravenous Given 04/15/18 0203)  ondansetron (ZOFRAN) injection 4 mg (4 mg Intravenous Given 04/15/18 0203)  iohexol (OMNIPAQUE) 300 MG/ML solution 80 mL (80 mLs Intravenous Contrast Given 04/15/18 0216)  piperacillin-tazobactam (ZOSYN)  IVPB 3.375 g (0 g Intravenous Stopped 04/15/18 0514)     Initial Impression / Assessment and Plan / ED Course  I have reviewed the triage vital signs and the nursing notes.  Pertinent labs & imaging results that were available during my care of the patient were reviewed by me and considered in my medical decision making (see chart for details).  Upper abdominal pain of uncertain cause.  Labs had been obtained at triage and are significant for moderate elevation of transaminases, mild elevation of alkaline phosphatase, bilirubin elevated to 3.4.  There is mild renal insufficiency present which is not changed from baseline.  There is mild hyponatremia and hypokalemia.  Hemoglobin is normal.  WBC is mildly elevated.  He will be sent for CT of abdomen and pelvis.  CT scan shows large gallstone in the gallbladder neck.  This is probably responsible for all of his symptoms.  He is being sent for ultrasound, started on antibiotics.  Ultrasound confirms cholelithiasis with some gallbladder wall thickening and small amount of pericholecystic fluid but negative sonographic Murphy sign.  This is suggestive but not diagnostic of acute cholecystitis.  Case is discussed with  Dr. Donell Beers of general surgery service who requested patient be admitted to medicine service and they will see him in consultation.  Case is discussed with Dr. Toniann Fail of [facility] hospitalist, who agrees to admit the patient.  Final Clinical Impressions(s) / ED Diagnoses   Final diagnoses:  Calculus of gallbladder with acute cholecystitis and obstruction  Elevated liver function tests  Renal insufficiency  Hypokalemia  Chronic anticoagulation  Atrial flutter with controlled response [facility])    ED Discharge Orders    None       Dione Booze, MD 04/15/18 [phone]

## 2018-04-15 NOTE — Consult Note (Signed)
Reason for Consult:gallstone and elevated LFTs Referring Physician: [name] is an 85 y.o. male.  HPI:  Patient is a 85 year old male who comes to the emergency department with approximately 2 days of worsening abdominal pain.  He describes it coming on quite suddenly in his upper abdomen.  He did have some nausea associated with this.  He denies fevers and chills at home.  He denies change in bowel habits.  He has not had pain like this before.  He describes the pain as sharp and constant.  Of note, the patient has COPD and paroxysmal atrial fibrillation.  He has anticoagulated with Eliquis. His  Male relatives all have longevity.    Past Medical History:  Diagnosis Date  . BPH (benign prostatic hyperplasia)   . COPD (chronic obstructive pulmonary disease) (HCC)   . Hypertension   . OA (osteoarthritis)   . PAF (paroxysmal atrial fibrillation) (HCC)     Past Surgical History:  Procedure Laterality Date  . CARDIOVERSION N/A 03/31/2017   Procedure: CARDIOVERSION;  Surgeon: Lars Masson, MD;  Location: [facility] ENDOSCOPY;  Service: Cardiovascular;  Laterality: N/A;  . CARPAL TUNNEL RELEASE  02/2010   Dr. Franky Macho  . NM MYOCAR PERF WALL MOTION  08/08/2011   lexiscan; normal pattern of perfusion in all regions; post-stress EF 69%; low risk scan   . TRANSTHORACIC ECHOCARDIOGRAM  2013   EF 52%, mild AV leaflet thickening; trace TR    No family history on file.  Social History:  reports that he quit smoking about 61 years ago. His smoking use included cigarettes. He has a 22.00 pack-year smoking history. He has never used smokeless tobacco. He reports that he does not drink alcohol or use drugs.  Allergies:  Allergies  Allergen Reactions  . Quinine Nausea And Vomiting    Medications:  Current Meds  Medication Sig  . amLODipine (NORVASC) 2.5 MG tablet Take 1 tablet (2.5 mg total) by mouth daily.  Marland Kitchen apixaban (ELIQUIS) 5 MG TABS tablet Take 1 tablet (5 mg total) by mouth 2  (two) times daily. LAST REFILL UNTIL F/U WITH CARDIOLOGIST.  Marland Kitchen Ascorbic Acid (VITAMIN C) 1000 MG tablet Take 1,000 mg by mouth daily.  Marland Kitchen doxazosin (CARDURA) 4 MG tablet Take 1 tablet (4 mg total) by mouth daily. KEEP OV.  Marland Kitchen losartan-hydrochlorothiazide (HYZAAR) 100-25 MG tablet Take 1 tablet by mouth daily.  . metoprolol succinate (TOPROL-XL) 25 MG 24 hr tablet TAKE 1 TABLET (25 MG TOTAL) BY MOUTH DAILY.  . Multiple Vitamins-Minerals (PRESERVISION AREDS 2) CAPS Take 1 capsule by mouth daily.     Results for orders placed or performed during the hospital encounter of 04/14/18 (from the past 48 hour(s))  Lipase, blood     Status: None   Collection Time: 04/14/18  7:19 PM  Result Value Ref Range   Lipase 23 11 - 51 U/L    Comment: Performed at [facility], [address], [place], [place] [address]  Comprehensive metabolic panel     Status: Abnormal   Collection Time: 04/14/18  7:19 PM  Result Value Ref Range   Sodium 134 (L) 135 - 145 mmol/L   Potassium 3.2 (L) 3.5 - 5.1 mmol/L   Chloride 99 98 - 111 mmol/L   CO2 21 (L) 22 - 32 mmol/L   Glucose, Bld 168 (H) 70 - 99 mg/dL   BUN 21 8 - 23 mg/dL   Creatinine, Ser 8.29 (H) 0.61 - 1.24 mg/dL   Calcium 9.3  8.9 - 10.3 mg/dL   Total Protein 7.7 6.5 - 8.1 g/dL   Albumin 3.9 3.5 - 5.0 g/dL   AST 333 (H) 15 - 41 U/L   ALT 347 (H) 0 - 44 U/L   Alkaline Phosphatase 158 (H) 38 - 126 U/L   Total Bilirubin 3.4 (H) 0.3 - 1.2 mg/dL   GFR calc non Af Amer 39 (L) >60 mL/min   GFR calc Af Amer 45 (L) >60 mL/min   Anion gap 14 5 - 15    Comment: Performed at [facility], [address] [place], [place] [address]  CBC     Status: Abnormal   Collection Time: 04/14/18  7:19 PM  Result Value Ref Range   WBC 13.8 (H) 4.0 - 10.5 K/uL   RBC 4.44 4.22 - 5.81 MIL/uL   Hemoglobin 13.7 13.0 - 17.0 g/dL   HCT 56.3 89.3 - 73.4 %   MCV 89.6 80.0 - 100.0 fL   MCH 30.9 26.0 - 34.0 pg   MCHC 34.4 30.0 - 36.0 g/dL   RDW 28.7 68.1 - 15.7 %    Platelets 225 150 - 400 K/uL   nRBC 0.0 0.0 - 0.2 %    Comment: Performed at [facility], [address], [place], [place] [address]  POC occult blood, ED Provider will collect     Status: None   Collection Time: 04/15/18  1:49 AM  Result Value Ref Range   Fecal Occult Bld NEGATIVE NEGATIVE  Urinalysis, Routine w reflex microscopic     Status: Abnormal   Collection Time: 04/15/18  3:56 AM  Result Value Ref Range   Color, Urine YELLOW YELLOW   APPearance HAZY (A) CLEAR   Specific Gravity, Urine 1.040 (H) 1.005 - 1.030   pH 5.0 5.0 - 8.0   Glucose, UA NEGATIVE NEGATIVE mg/dL   Hgb urine dipstick SMALL (A) NEGATIVE   Bilirubin Urine NEGATIVE NEGATIVE   Ketones, ur 5 (A) NEGATIVE mg/dL   Protein, ur 30 (A) NEGATIVE mg/dL   Nitrite NEGATIVE NEGATIVE   Leukocytes,Ua MODERATE (A) NEGATIVE   RBC / HPF 6-10 0 - 5 RBC/hpf   WBC, UA >50 (H) 0 - 5 WBC/hpf   Bacteria, UA NONE SEEN NONE SEEN   Squamous Epithelial / LPF 0-5 0 - 5   Mucus PRESENT     Comment: Performed at [facility], [address], [place], [place] [address]    Ct Abdomen Pelvis W Contrast  Addendum Date: 04/15/2018   ADDENDUM REPORT: 04/15/2018 03:42 ADDENDUM: Additional clinical information, jaundice. The patient's CT images are reviewed. Extrahepatic bile duct is slightly enlarged, measuring up to 9 mm. Questionable density within the distal duct at the head of pancreas. Recommend correlation with LFTs. MRCP could be obtained if concern for ductal obstruction is given history of jaundice Electronically Signed   By: [name] M.D.   On: 04/15/2018 03:42   Result Date: 04/15/2018 CLINICAL DATA:  Abdominal pain with nausea and vomiting EXAM: CT ABDOMEN AND PELVIS WITH CONTRAST TECHNIQUE: Multidetector CT imaging of the abdomen and pelvis was performed using the standard protocol following bolus administration of intravenous contrast. CONTRAST:  57mL OMNIPAQUE IOHEXOL 300 MG/ML  SOLN COMPARISON:  PET CT 01/08/2018,  CT 12/30/2017 FINDINGS: Lower chest: Lung bases again demonstrate oval subpleural mass in the left lower lobe, this measures 4.1 x 2.2 cm and appears slightly increased in size. No significant pleural effusion. Small hiatal hernia. Hepatobiliary: Cyst at  the dome of the liver. Calcified stone at the neck of the gallbladder. Suspected edema or fluid adjacent to the gallbladder fundus. No biliary dilatation Pancreas: Unremarkable. No pancreatic ductal dilatation or surrounding inflammatory changes. Spleen: Normal in size without focal abnormality. Adrenals/Urinary Tract: Adrenal glands are unremarkable. Kidneys are normal, without renal calculi, focal lesion, or hydronephrosis. Bladder is unremarkable. Stomach/Bowel: The stomach is nonenlarged. No dilated small bowel. No colon wall thickening. Negative appendix. Colon diverticular disease without acute inflammatory change. Vascular/Lymphatic: Moderate aortic atherosclerosis. No aneurysm. No significantly enlarged lymph nodes. Reproductive: Enlarged heterogeneous prostate with calcification Other: Negative for free air or free fluid Musculoskeletal: Multiple level degenerative changes of the spine. Chronic compression deformity L1. IMPRESSION: 1. 2.3 cm calcified stone at the gallbladder neck. Suspected gallbladder wall edema versus small amount of pericholecystic fluid. Suggest correlation with ultrasound. 2. Slight interval increase in size of subpleural left lower lobe lung mass now measuring 4.1 cm. 3. Diverticular disease of the colon without acute inflammatory change Electronically Signed: By: [name] M.D. On: 04/15/2018 02:38   US Abdomen Limited  Result Date: 04/15/2018 CLINICAL DATA:  Jaundice EXAM: ULTRASOUND ABDOMEN LIMITED RIGHT UPPER QUADRANT COMPARISON:  CT 04/15/2018 FINDINGS: Gallbladder: Shadowing stone within the gallbladder neck measuring 1.9 cm. Increased wall thickness of 8.8 mm with trace pericholecystic fluid. Negative sonographic  Murphy. Small amount of intraluminal sludge. Common bile duct: Diameter: 4.9 mm Liver: Heterogeneous echotexture without focal hepatic abnormality. Portal vein is patent on color Doppler imaging with normal direction of blood flow towards the liver. IMPRESSION: 1. Gallstone with increased wall thickness and small amount of pericholecystic fluid, raising concern for cholecystitis despite negative sonographic Murphy. Electronically Signed   By: [name] M.D.   On: 04/15/2018 03:39    Review of Systems  Constitutional: Negative.   HENT: Negative.   Eyes: Negative.   Respiratory: Negative.   Cardiovascular: Negative.   Gastrointestinal: Positive for abdominal pain and nausea. Negative for diarrhea.  Genitourinary: Negative.   Musculoskeletal: Negative.   Skin: Negative.   Neurological: Negative.   Endo/Heme/Allergies: Negative.   Psychiatric/Behavioral: Negative.    Blood pressure (!) 151/60, pulse 99, temperature 97.9 F (36.6 C), temperature source Oral, resp. rate (!) 25, SpO2 94 %. Physical Exam  Constitutional: He is oriented to person, place, and time. He appears well-developed and well-nourished. He appears distressed (mild).  HENT:  Head: Normocephalic and atraumatic.  Eyes: Pupils are equal, round, and reactive to light. Conjunctivae are normal. Scleral icterus is present.  Neck: Neck supple. No tracheal deviation present. No thyromegaly present.  Cardiovascular: Normal rate, regular rhythm and intact distal pulses.  Respiratory: Effort normal. No respiratory distress. He exhibits no tenderness.  GI: Soft. He exhibits distension (mild). There is abdominal tenderness (RUQ and epigastrium). There is guarding. There is no rebound.  Musculoskeletal: Normal range of motion.        General: No tenderness, deformity or edema.  Lymphadenopathy:    He has no cervical adenopathy.  Neurological: He is alert and oriented to person, place, and time. Coordination normal.  Very hard of  hearing.    Skin: Skin is warm and dry. No rash noted. He is not diaphoretic. No erythema. No pallor.  Psychiatric: He has a normal mood and affect. His behavior is normal. Judgment and thought content normal.    Assessment/Plan: Acute calculous cholecystitis Elevated LFTs. Anticoagulation.  Pt will need cardiology evaluation Hold eliquis GI consult for question of dilated ducts and elevated LFTs. Will likely need  lap chole vs perc chole tube depending on additional testing and cardiology eval.    Almond Lint 04/15/2018, 6:46 AM

## 2018-04-15 NOTE — Progress Notes (Signed)
Pt came to MRI, I spoke to Dr. Azucena Kuba regarding patient's lab values. Dr. Renato Gails felt contrast was not necessary for the exam.

## 2018-04-15 NOTE — H&P (View-Only) (Signed)
[facility] Consultation Note  Referring Provider: Dr. Kuber Ghimire ([facility]) Primary Care Physician:  Wilson, Fred H, MD  Reason for Consultation:  Abdominal pain, elevated LFTs  HPI: Vincent Barrett is a 85 y.o. male on chronic apixaban (last dose yesterday evening) presenting with 2-3 days of abdominal pain.  Epigastric, constant, can radiate to back, some chills/sweats.  Some nausea.  Some dark stools.  Imaging and labs showed suspicion for cholecystitis, and prominent CBD with possible distal CBD stone, and elevated LFTs with leukocytosis.   Past Medical History:  Diagnosis Date  . BPH (benign prostatic hyperplasia)   . COPD (chronic obstructive pulmonary disease) (HCC)   . Hypertension   . OA (osteoarthritis)   . PAF (paroxysmal atrial fibrillation) (HCC)     Past Surgical History:  Procedure Laterality Date  . CARDIOVERSION N/A 03/31/2017   Procedure: CARDIOVERSION;  Surgeon: Nelson, Katarina H, MD;  Location: [facility] ENDOSCOPY;  Service: Cardiovascular;  Laterality: N/A;  . CARPAL TUNNEL RELEASE  02/2010   Dr. Cabbell  . NM MYOCAR PERF WALL MOTION  08/08/2011   lexiscan; normal pattern of perfusion in all regions; post-stress EF 69%; low risk scan   . TRANSTHORACIC ECHOCARDIOGRAM  2013   EF 52%, mild AV leaflet thickening; trace TR    Prior to Admission medications   Medication Sig Start Date End Date Taking? Authorizing Provider  amLODipine (NORVASC) 2.5 MG tablet Take 1 tablet (2.5 mg total) by mouth daily. 02/21/16  Yes Hilty, Kenneth C, MD  apixaban (ELIQUIS) 5 MG TABS tablet Take 1 tablet (5 mg total) by mouth 2 (two) times daily. LAST REFILL UNTIL F/U WITH CARDIOLOGIST. 02/17/18  Yes Hilty, Kenneth C, MD  Ascorbic Acid (VITAMIN C) 1000 MG tablet Take 1,000 mg by mouth daily.   Yes [provider]  doxazosin (CARDURA) 4 MG tablet Take 1 tablet (4 mg total) by mouth daily. KEEP OV. 01/16/17  Yes Hilty, Kenneth C, MD  losartan-hydrochlorothiazide (HYZAAR) 100-25 MG  tablet Take 1 tablet by mouth daily. 01/18/18  Yes Hilty, Kenneth C, MD  metoprolol succinate (TOPROL-XL) 25 MG 24 hr tablet TAKE 1 TABLET (25 MG TOTAL) BY MOUTH DAILY. 04/13/17  Yes Hilty, Kenneth C, MD  Multiple Vitamins-Minerals (PRESERVISION AREDS 2) CAPS Take 1 capsule by mouth daily.   Yes [provider]    Current Facility-Administered Medications  Medication Dose Route Frequency Provider Last Rate Last Dose  . 0.9 %  sodium chloride infusion   Intravenous Continuous Ghimire, Kuber, MD 125 mL/hr at 04/15/18 0958    . albuterol (PROVENTIL) (2.5 MG/3ML) 0.083% nebulizer solution 2.5 mg  2.5 mg Nebulization Q2H PRN Ghimire, Kuber, MD      . amLODipine (NORVASC) tablet 2.5 mg  2.5 mg Oral Daily Ghimire, Kuber, MD   2.5 mg at 04/15/18 0955  . doxazosin (CARDURA) tablet 4 mg  4 mg Oral Daily Ghimire, Kuber, MD      . HYDROcodone-acetaminophen (NORCO/VICODIN) 5-325 MG per tablet 1-2 tablet  1-2 tablet Oral Q4H PRN Ghimire, Kuber, MD      . metoprolol succinate (TOPROL-XL) 24 hr tablet 25 mg  25 mg Oral Daily Ghimire, Kuber, MD   25 mg at 04/15/18 0955  . morphine 2 MG/ML injection 2 mg  2 mg Intravenous Q2H PRN Ghimire, Kuber, MD      . multivitamin (PROSIGHT) tablet 1 tablet  1 tablet Oral Daily Mancheril, Benjamin G, RPH   1 tablet at 04/15/18 0955  . ondansetron (ZOFRAN) tablet 4 mg    4 mg Oral Q6H PRN Ghimire, Kuber, MD       Or  . ondansetron (ZOFRAN) injection 4 mg  4 mg Intravenous Q6H PRN Ghimire, Kuber, MD      . potassium chloride 10 mEq in 100 mL IVPB  10 mEq Intravenous Q1 Hr x 2 Glick, David, MD 100 mL/hr at 04/15/18 0656 10 mEq at 04/15/18 0656  . sodium chloride flush (NS) 0.9 % injection 3 mL  3 mL Intravenous Once Ghimire, Kuber, MD      . vitamin C (ASCORBIC ACID) tablet 1,000 mg  1,000 mg Oral Daily Ghimire, Kuber, MD   1,000 mg at 04/15/18 0955    Allergies as of 04/14/2018 - Review Complete 04/14/2018  Allergen Reaction Noted  . Quinine Nausea And Vomiting  04/17/2015    History reviewed. No pertinent family history.  Social History   Socioeconomic History  . Marital status: Married    Spouse name: Not on file  . Number of children: 4  . Years of education: Not on file  . Highest education level: Not on file  Occupational History  . Not on file  Social Needs  . Financial resource strain: Not on file  . Food insecurity:    Worry: Not on file    Inability: Not on file  . Transportation needs:    Medical: Not on file    Non-medical: Not on file  Tobacco Use  . Smoking status: Former Smoker    Packs/day: 2.00    Years: 11.00    Pack years: 22.00    Types: Cigarettes    Last attempt to quit: 02/10/1957    Years since quitting: 61.2  . Smokeless tobacco: Never Used  Substance and Sexual Activity  . Alcohol use: No  . Drug use: No  . Sexual activity: Not on file  Lifestyle  . Physical activity:    Days per week: Not on file    Minutes per session: Not on file  . Stress: Not on file  Relationships  . Social connections:    Talks on phone: Not on file    Gets together: Not on file    Attends religious service: Not on file    Active member of club or organization: Not on file    Attends meetings of clubs or organizations: Not on file    Relationship status: Not on file  . Intimate partner violence:    Fear of current or ex partner: Not on file    Emotionally abused: Not on file    Physically abused: Not on file    Forced sexual activity: Not on file  Other Topics Concern  . Not on file  Social History Narrative   [place] [employer] veteran - [employer]    Review of Systems: As per HPI, all others negative  Physical Exam: Vital signs in last 24 hours: Temp:  [97.9 F (36.6 C)-99.4 F (37.4 C)] 99.4 F (37.4 C) (03/05 0821) Pulse Rate:  [96-104] 103 (03/05 0821) Resp:  [16-28] 20 (03/05 0821) BP: (131-171)/(59-94) 159/71 (03/05 0821) SpO2:  [94 %-99 %] 96 % (03/05 0821)   General:   Alert,  Well-developed, well-nourished,  pleasant and cooperative in NAD Head:  Normocephalic and atraumatic. Eyes:  Sclera clear, no icterus.   Conjunctiva pink. Ears:  ++presbyacusis++. Nose:  No deformity, discharge,  or lesions. Mouth:  No deformity or lesions.  Oropharynx pink & moist. Neck:  Supple; no masses or thyromegaly. Lungs:  Clear throughout to auscultation.     No wheezes, crackles, or rhonchi. No acute distress. Heart:  Regular rate and rhythm; no murmurs, clicks, rubs,  or gallops. Abdomen: + rebound, + Guarding + murphy's,. No masses, hepatosplenomegaly or hernias noted. Hyperactive bowel sounds Msk:  Symmetrical without gross deformities. Normal posture. Pulses:  Normal pulses noted. Extremities:  Without clubbing or edema. Neurologic:  Alert and  oriented x4;  grossly normal neurologically. Skin:  Scattered ecchymoses, otherwise intact without significant lesions or rashes. Psych:  Alert and cooperative. Normal mood and affect.   Lab Results: Recent Labs    04/14/18 1919  WBC 13.8*  HGB 13.7  HCT 39.8  PLT 225   BMET Recent Labs    04/14/18 1919  NA 134*  K 3.2*  CL 99  CO2 21*  GLUCOSE 168*  BUN 21  CREATININE 1.54*  CALCIUM 9.3   LFT Recent Labs    04/14/18 1919  PROT 7.7  ALBUMIN 3.9  AST 284*  ALT 347*  ALKPHOS 158*  BILITOT 3.4*   PT/INR No results for input(s): LABPROT, INR in the last 72 hours.  Studies/Results: Ct Abdomen Pelvis W Contrast  Addendum Date: 04/15/2018   ADDENDUM REPORT: 04/15/2018 03:42 ADDENDUM: Additional clinical information, jaundice. The patient's CT images are reviewed. Extrahepatic bile duct is slightly enlarged, measuring up to 9 mm. Questionable density within the distal duct at the head of pancreas. Recommend correlation with LFTs. MRCP could be obtained if concern for ductal obstruction is given history of jaundice Electronically Signed   By: Kim  Fujinaga M.D.   On: 04/15/2018 03:42   Result Date: 04/15/2018 CLINICAL DATA:  Abdominal pain with  nausea and vomiting EXAM: CT ABDOMEN AND PELVIS WITH CONTRAST TECHNIQUE: Multidetector CT imaging of the abdomen and pelvis was performed using the standard protocol following bolus administration of intravenous contrast. CONTRAST:  80mL OMNIPAQUE IOHEXOL 300 MG/ML  SOLN COMPARISON:  PET CT 01/08/2018, CT 12/30/2017 FINDINGS: Lower chest: Lung bases again demonstrate oval subpleural mass in the left lower lobe, this measures 4.1 x 2.2 cm and appears slightly increased in size. No significant pleural effusion. Small hiatal hernia. Hepatobiliary: Cyst at the dome of the liver. Calcified stone at the neck of the gallbladder. Suspected edema or fluid adjacent to the gallbladder fundus. No biliary dilatation Pancreas: Unremarkable. No pancreatic ductal dilatation or surrounding inflammatory changes. Spleen: Normal in size without focal abnormality. Adrenals/Urinary Tract: Adrenal glands are unremarkable. Kidneys are normal, without renal calculi, focal lesion, or hydronephrosis. Bladder is unremarkable. Stomach/Bowel: The stomach is nonenlarged. No dilated small bowel. No colon wall thickening. Negative appendix. Colon diverticular disease without acute inflammatory change. Vascular/Lymphatic: Moderate aortic atherosclerosis. No aneurysm. No significantly enlarged lymph nodes. Reproductive: Enlarged heterogeneous prostate with calcification Other: Negative for free air or free fluid Musculoskeletal: Multiple level degenerative changes of the spine. Chronic compression deformity L1. IMPRESSION: 1. 2.3 cm calcified stone at the gallbladder neck. Suspected gallbladder wall edema versus small amount of pericholecystic fluid. Suggest correlation with ultrasound. 2. Slight interval increase in size of subpleural left lower lobe lung mass now measuring 4.1 cm. 3. Diverticular disease of the colon without acute inflammatory change Electronically Signed: By: Kim  Fujinaga M.D. On: 04/15/2018 02:38   Us Abdomen Limited  Result  Date: 04/15/2018 CLINICAL DATA:  Jaundice EXAM: ULTRASOUND ABDOMEN LIMITED RIGHT UPPER QUADRANT COMPARISON:  CT 04/15/2018 FINDINGS: Gallbladder: Shadowing stone within the gallbladder neck measuring 1.9 cm. Increased wall thickness of 8.8 mm with trace pericholecystic fluid. Negative sonographic Murphy. Small amount of intraluminal sludge. Common   bile duct: Diameter: 4.9 mm Liver: Heterogeneous echotexture without focal hepatic abnormality. Portal vein is patent on color Doppler imaging with normal direction of blood flow towards the liver. IMPRESSION: 1. Gallstone with increased wall thickness and small amount of pericholecystic fluid, raising concern for cholecystitis despite negative sonographic Murphy. Electronically Signed   By: Kim  Fujinaga M.D.   On: 04/15/2018 03:39   Impression:  1.  Abdominal pain, suspect from acute cholecystitis +/- choledocholithiasis. 2.  Elevated LFTs. 3.  Chronic anticoagulation, apixaban.  Plan:  1.  IVF, antibiotics. 2.  Hold apixaban. 3.  MRCP today. 4.  Pending MRCP, might need ERCP Saturday (to give proper time for apixaban washout). 5.  Surgical team following for possible cholecystectomy versus perc drain, pending cardiology consultation.   LOS: 0 days   Ricca Melgarejo M  04/15/2018, 10:26 AM  Cell [phone] If no answer or after 5 PM call [phone]  

## 2018-04-15 NOTE — Consult Note (Signed)
[facility] Consultation Note  Referring Provider: Dr. Dorcas Carrow [facility]) Primary Care Physician:  [name], MD  Reason for Consultation:  Abdominal pain, elevated LFTs  HPI: Vincent Barrett is a 85 y.o. male on chronic apixaban (last dose yesterday evening) presenting with 2-3 days of abdominal pain.  Epigastric, constant, can radiate to back, some chills/sweats.  Some nausea.  Some dark stools.  Imaging and labs showed suspicion for cholecystitis, and prominent CBD with possible distal CBD stone, and elevated LFTs with leukocytosis.   Past Medical History:  Diagnosis Date  . BPH (benign prostatic hyperplasia)   . COPD (chronic obstructive pulmonary disease) (HCC)   . Hypertension   . OA (osteoarthritis)   . PAF (paroxysmal atrial fibrillation) (HCC)     Past Surgical History:  Procedure Laterality Date  . CARDIOVERSION N/A 03/31/2017   Procedure: CARDIOVERSION;  Surgeon: Lars Masson, MD;  Location: [facility] ENDOSCOPY;  Service: Cardiovascular;  Laterality: N/A;  . CARPAL TUNNEL RELEASE  02/2010   Dr. Franky Macho  . NM MYOCAR PERF WALL MOTION  08/08/2011   lexiscan; normal pattern of perfusion in all regions; post-stress EF 69%; low risk scan   . TRANSTHORACIC ECHOCARDIOGRAM  2013   EF 52%, mild AV leaflet thickening; trace TR    Prior to Admission medications   Medication Sig Start Date End Date Taking? Authorizing Provider  amLODipine (NORVASC) 2.5 MG tablet Take 1 tablet (2.5 mg total) by mouth daily. 02/21/16  Yes Hilty, Lisette Abu, MD  apixaban (ELIQUIS) 5 MG TABS tablet Take 1 tablet (5 mg total) by mouth 2 (two) times daily. LAST REFILL UNTIL F/U WITH CARDIOLOGIST. 02/17/18  Yes Hilty, Lisette Abu, MD  Ascorbic Acid (VITAMIN C) 1000 MG tablet Take 1,000 mg by mouth daily.   Yes [provider]  doxazosin (CARDURA) 4 MG tablet Take 1 tablet (4 mg total) by mouth daily. KEEP OV. 01/16/17  Yes Hilty, Lisette Abu, MD  losartan-hydrochlorothiazide (HYZAAR) 100-25 MG  tablet Take 1 tablet by mouth daily. 01/18/18  Yes Hilty, Lisette Abu, MD  metoprolol succinate (TOPROL-XL) 25 MG 24 hr tablet TAKE 1 TABLET (25 MG TOTAL) BY MOUTH DAILY. 04/13/17  Yes Hilty, Lisette Abu, MD  Multiple Vitamins-Minerals (PRESERVISION AREDS 2) CAPS Take 1 capsule by mouth daily.   Yes [provider]    Current Facility-Administered Medications  Medication Dose Route Frequency Provider Last Rate Last Dose  . 0.9 %  sodium chloride infusion   Intravenous Continuous Dorcas Carrow, MD 125 mL/hr at 04/15/18 0958    . albuterol (PROVENTIL) (2.5 MG/3ML) 0.083% nebulizer solution 2.5 mg  2.5 mg Nebulization Q2H PRN Dorcas Carrow, MD      . amLODipine (NORVASC) tablet 2.5 mg  2.5 mg Oral Daily Dorcas Carrow, MD   2.5 mg at 04/15/18 0955  . doxazosin (CARDURA) tablet 4 mg  4 mg Oral Daily Ghimire, Lyndel Safe, MD      . HYDROcodone-acetaminophen (NORCO/VICODIN) 5-325 MG per tablet 1-2 tablet  1-2 tablet Oral Q4H PRN Dorcas Carrow, MD      . metoprolol succinate (TOPROL-XL) 24 hr tablet 25 mg  25 mg Oral Daily Dorcas Carrow, MD   25 mg at 04/15/18 0955  . morphine 2 MG/ML injection 2 mg  2 mg Intravenous Q2H PRN Dorcas Carrow, MD      . multivitamin (PROSIGHT) tablet 1 tablet  1 tablet Oral Daily Sampson Si, RPH   1 tablet at 04/15/18 0955  . ondansetron (ZOFRAN) tablet 4 mg  4 mg Oral Q6H PRN Dorcas Carrow, MD       Or  . ondansetron (ZOFRAN) injection 4 mg  4 mg Intravenous Q6H PRN Dorcas Carrow, MD      . potassium chloride 10 mEq in 100 mL IVPB  10 mEq Intravenous Q1 Hr x 2 Dione Booze, MD 100 mL/hr at 04/15/18 0656 10 mEq at 04/15/18 0656  . sodium chloride flush (NS) 0.9 % injection 3 mL  3 mL Intravenous Once Dorcas Carrow, MD      . vitamin C (ASCORBIC ACID) tablet 1,000 mg  1,000 mg Oral Daily Dorcas Carrow, MD   1,000 mg at 04/15/18 0955    Allergies as of 04/14/2018 - Review Complete 04/14/2018  Allergen Reaction Noted  . Quinine Nausea And Vomiting  04/17/2015    History reviewed. No pertinent family history.  Social History   Socioeconomic History  . Marital status: Married    Spouse name: Not on file  . Number of children: 4  . Years of education: Not on file  . Highest education level: Not on file  Occupational History  . Not on file  Social Needs  . Financial resource strain: Not on file  . Food insecurity:    Worry: Not on file    Inability: Not on file  . Transportation needs:    Medical: Not on file    Non-medical: Not on file  Tobacco Use  . Smoking status: Former Smoker    Packs/day: 2.00    Years: 11.00    Pack years: 22.00    Types: Cigarettes    Last attempt to quit: 02/10/1957    Years since quitting: 61.2  . Smokeless tobacco: Never Used  Substance and Sexual Activity  . Alcohol use: No  . Drug use: No  . Sexual activity: Not on file  Lifestyle  . Physical activity:    Days per week: Not on file    Minutes per session: Not on file  . Stress: Not on file  Relationships  . Social connections:    Talks on phone: Not on file    Gets together: Not on file    Attends religious service: Not on file    Active member of club or organization: Not on file    Attends meetings of clubs or organizations: Not on file    Relationship status: Not on file  . Intimate partner violence:    Fear of current or ex partner: Not on file    Emotionally abused: Not on file    Physically abused: Not on file    Forced sexual activity: Not on file  Other Topics Concern  . Not on file  Social History Narrative   [place] [employer] veteran - [employer]    Review of Systems: As per HPI, all others negative  Physical Exam: Vital signs in last 24 hours: Temp:  [97.9 F (36.6 C)-99.4 F (37.4 C)] 99.4 F (37.4 C) (03/05 0821) Pulse Rate:  [96-104] 103 (03/05 0821) Resp:  [16-28] 20 (03/05 0821) BP: (131-171)/(59-94) 159/71 (03/05 0821) SpO2:  [94 %-99 %] 96 % (03/05 0821)   General:   Alert,  Well-developed, well-nourished,  pleasant and cooperative in NAD Head:  Normocephalic and atraumatic. Eyes:  Sclera clear, no icterus.   Conjunctiva pink. Ears:  ++presbyacusis++. Nose:  No deformity, discharge,  or lesions. Mouth:  No deformity or lesions.  Oropharynx pink & moist. Neck:  Supple; no masses or thyromegaly. Lungs:  Clear throughout to auscultation.  No wheezes, crackles, or rhonchi. No acute distress. Heart:  Regular rate and rhythm; no murmurs, clicks, rubs,  or gallops. Abdomen: + rebound, + Guarding + murphy's,. No masses, hepatosplenomegaly or hernias noted. Hyperactive bowel sounds Msk:  Symmetrical without gross deformities. Normal posture. Pulses:  Normal pulses noted. Extremities:  Without clubbing or edema. Neurologic:  Alert and  oriented x4;  grossly normal neurologically. Skin:  Scattered ecchymoses, otherwise intact without significant lesions or rashes. Psych:  Alert and cooperative. Normal mood and affect.   Lab Results: Recent Labs    04/14/18 1919  WBC 13.8*  HGB 13.7  HCT 39.8  PLT 225   BMET Recent Labs    04/14/18 1919  NA 134*  K 3.2*  CL 99  CO2 21*  GLUCOSE 168*  BUN 21  CREATININE 1.54*  CALCIUM 9.3   LFT Recent Labs    04/14/18 1919  PROT 7.7  ALBUMIN 3.9  AST 284*  ALT 347*  ALKPHOS 158*  BILITOT 3.4*   PT/INR No results for input(s): LABPROT, INR in the last 72 hours.  Studies/Results: Ct Abdomen Pelvis W Contrast  Addendum Date: 04/15/2018   ADDENDUM REPORT: 04/15/2018 03:42 ADDENDUM: Additional clinical information, jaundice. The patient's CT images are reviewed. Extrahepatic bile duct is slightly enlarged, measuring up to 9 mm. Questionable density within the distal duct at the head of pancreas. Recommend correlation with LFTs. MRCP could be obtained if concern for ductal obstruction is given history of jaundice Electronically Signed   By: [name] M.D.   On: 04/15/2018 03:42   Result Date: 04/15/2018 CLINICAL DATA:  Abdominal pain with  nausea and vomiting EXAM: CT ABDOMEN AND PELVIS WITH CONTRAST TECHNIQUE: Multidetector CT imaging of the abdomen and pelvis was performed using the standard protocol following bolus administration of intravenous contrast. CONTRAST:  96mL OMNIPAQUE IOHEXOL 300 MG/ML  SOLN COMPARISON:  PET CT 01/08/2018, CT 12/30/2017 FINDINGS: Lower chest: Lung bases again demonstrate oval subpleural mass in the left lower lobe, this measures 4.1 x 2.2 cm and appears slightly increased in size. No significant pleural effusion. Small hiatal hernia. Hepatobiliary: Cyst at the dome of the liver. Calcified stone at the neck of the gallbladder. Suspected edema or fluid adjacent to the gallbladder fundus. No biliary dilatation Pancreas: Unremarkable. No pancreatic ductal dilatation or surrounding inflammatory changes. Spleen: Normal in size without focal abnormality. Adrenals/Urinary Tract: Adrenal glands are unremarkable. Kidneys are normal, without renal calculi, focal lesion, or hydronephrosis. Bladder is unremarkable. Stomach/Bowel: The stomach is nonenlarged. No dilated small bowel. No colon wall thickening. Negative appendix. Colon diverticular disease without acute inflammatory change. Vascular/Lymphatic: Moderate aortic atherosclerosis. No aneurysm. No significantly enlarged lymph nodes. Reproductive: Enlarged heterogeneous prostate with calcification Other: Negative for free air or free fluid Musculoskeletal: Multiple level degenerative changes of the spine. Chronic compression deformity L1. IMPRESSION: 1. 2.3 cm calcified stone at the gallbladder neck. Suspected gallbladder wall edema versus small amount of pericholecystic fluid. Suggest correlation with ultrasound. 2. Slight interval increase in size of subpleural left lower lobe lung mass now measuring 4.1 cm. 3. Diverticular disease of the colon without acute inflammatory change Electronically Signed: By: [name] M.D. On: 04/15/2018 02:38   US Abdomen Limited  Result  Date: 04/15/2018 CLINICAL DATA:  Jaundice EXAM: ULTRASOUND ABDOMEN LIMITED RIGHT UPPER QUADRANT COMPARISON:  CT 04/15/2018 FINDINGS: Gallbladder: Shadowing stone within the gallbladder neck measuring 1.9 cm. Increased wall thickness of 8.8 mm with trace pericholecystic fluid. Negative sonographic Murphy. Small amount of intraluminal sludge. Common  bile duct: Diameter: 4.9 mm Liver: Heterogeneous echotexture without focal hepatic abnormality. Portal vein is patent on color Doppler imaging with normal direction of blood flow towards the liver. IMPRESSION: 1. Gallstone with increased wall thickness and small amount of pericholecystic fluid, raising concern for cholecystitis despite negative sonographic Murphy. Electronically Signed   By: [name] M.D.   On: 04/15/2018 03:39   Impression:  1.  Abdominal pain, suspect from acute cholecystitis +/- choledocholithiasis. 2.  Elevated LFTs. 3.  Chronic anticoagulation, apixaban.  Plan:  1.  IVF, antibiotics. 2.  Hold apixaban. 3.  MRCP today. 4.  Pending MRCP, might need ERCP Saturday (to give proper time for apixaban washout). 5.  Surgical team following for possible cholecystectomy versus perc drain, pending cardiology consultation.   LOS: 0 days   Liticia Gasior M  04/15/2018, 10:26 AM  Cell [phone] If no answer or after 5 PM call [phone]

## 2018-04-15 NOTE — ED Notes (Signed)
Patient transported to Ultrasound 

## 2018-04-16 ENCOUNTER — Inpatient Hospital Stay (HOSPITAL_COMMUNITY): Payer: Medicare Other | Admitting: Anesthesiology

## 2018-04-16 ENCOUNTER — Inpatient Hospital Stay (HOSPITAL_COMMUNITY): Payer: Medicare Other

## 2018-04-16 ENCOUNTER — Other Ambulatory Visit: Payer: Self-pay

## 2018-04-16 ENCOUNTER — Encounter (HOSPITAL_COMMUNITY): Payer: Self-pay | Admitting: Anesthesiology

## 2018-04-16 ENCOUNTER — Encounter (HOSPITAL_COMMUNITY)
Admission: EM | Disposition: A | Discharge status: Home Health | Payer: Self-pay | Source: Home / Self Care | Attending: Internal Medicine

## 2018-04-16 DIAGNOSIS — Z7901 Long term (current) use of anticoagulants: Secondary | ICD-10-CM

## 2018-04-16 DIAGNOSIS — J438 Other emphysema: Secondary | ICD-10-CM

## 2018-04-16 HISTORY — PX: CHOLECYSTECTOMY: SHX55

## 2018-04-16 LAB — CBC
HCT: 34.3 % — ABNORMAL LOW (ref 39.0–52.0)
Hemoglobin: 11.9 g/dL — ABNORMAL LOW (ref 13.0–17.0)
MCH: 30.9 pg (ref 26.0–34.0)
MCHC: 34.7 g/dL (ref 30.0–36.0)
MCV: 89.1 fL (ref 80.0–100.0)
Platelets: 189 10*3/uL (ref 150–400)
RBC: 3.85 MIL/uL — ABNORMAL LOW (ref 4.22–5.81)
RDW: 12.3 % (ref 11.5–15.5)
WBC: 21.8 10*3/uL — ABNORMAL HIGH (ref 4.0–10.5)
nRBC: 0 % (ref 0.0–0.2)

## 2018-04-16 LAB — COMPREHENSIVE METABOLIC PANEL
ALT: 155 U/L — ABNORMAL HIGH (ref 0–44)
AST: 66 U/L — ABNORMAL HIGH (ref 15–41)
Albumin: 2.9 g/dL — ABNORMAL LOW (ref 3.5–5.0)
Alkaline Phosphatase: 96 U/L (ref 38–126)
Anion gap: 9 (ref 5–15)
BUN: 19 mg/dL (ref 8–23)
CO2: 21 mmol/L — ABNORMAL LOW (ref 22–32)
Calcium: 7.8 mg/dL — ABNORMAL LOW (ref 8.9–10.3)
Chloride: 105 mmol/L (ref 98–111)
Creatinine, Ser: 1.52 mg/dL — ABNORMAL HIGH (ref 0.61–1.24)
GFR calc Af Amer: 46 mL/min — ABNORMAL LOW (ref >60)
GFR, EST NON AFRICAN AMERICAN: 40 mL/min — AB (ref >60)
Glucose, Bld: 135 mg/dL — ABNORMAL HIGH (ref 70–99)
Potassium: 3.2 mmol/L — ABNORMAL LOW (ref 3.5–5.1)
Sodium: 135 mmol/L (ref 135–145)
Total Bilirubin: 2 mg/dL — ABNORMAL HIGH (ref 0.3–1.2)
Total Protein: 6.1 g/dL — ABNORMAL LOW (ref 6.5–8.1)

## 2018-04-16 LAB — SURGICAL PCR SCREEN
MRSA, PCR: NEGATIVE
STAPHYLOCOCCUS AUREUS: NEGATIVE

## 2018-04-16 LAB — MAGNESIUM: Magnesium: 1.6 mg/dL — ABNORMAL LOW (ref 1.7–2.4)

## 2018-04-16 LAB — LIPASE, BLOOD: Lipase: 18 U/L (ref 11–51)

## 2018-04-16 SURGERY — LAPAROSCOPIC CHOLECYSTECTOMY WITH INTRAOPERATIVE CHOLANGIOGRAM
Anesthesia: General | Site: Abdomen

## 2018-04-16 MED ORDER — PROPOFOL 10 MG/ML IV BOLUS
INTRAVENOUS | Status: AC
  Filled 2018-04-16: qty 20

## 2018-04-16 MED ORDER — DEXAMETHASONE SODIUM PHOSPHATE 10 MG/ML IJ SOLN
INTRAMUSCULAR | Status: AC
  Filled 2018-04-16: qty 1

## 2018-04-16 MED ORDER — KCL IN DEXTROSE-NACL 40-5-0.9 MEQ/L-%-% IV SOLN
INTRAVENOUS | Status: DC
  Filled 2018-04-16: qty 1000

## 2018-04-16 MED ORDER — FENTANYL CITRATE (PF) 100 MCG/2ML IJ SOLN
INTRAMUSCULAR | Status: DC | PRN
  Administered 2018-04-16: 25 ug via INTRAVENOUS
  Administered 2018-04-16: 50 ug via INTRAVENOUS
  Administered 2018-04-16: 25 ug via INTRAVENOUS

## 2018-04-16 MED ORDER — PHENYLEPHRINE 40 MCG/ML (10ML) SYRINGE FOR IV PUSH (FOR BLOOD PRESSURE SUPPORT)
PREFILLED_SYRINGE | INTRAVENOUS | Status: DC | PRN
  Administered 2018-04-16: 80 ug via INTRAVENOUS

## 2018-04-16 MED ORDER — 0.9 % SODIUM CHLORIDE (POUR BTL) OPTIME
TOPICAL | Status: DC | PRN
  Administered 2018-04-16: 1000 mL

## 2018-04-16 MED ORDER — OXYCODONE HCL 5 MG PO TABS
5.0000 mg | ORAL_TABLET | ORAL | Status: DC | PRN
  Administered 2018-04-16 – 2018-04-17 (×3): 5 mg via ORAL
  Filled 2018-04-16 (×3): qty 1

## 2018-04-16 MED ORDER — SODIUM CHLORIDE 0.9 % IR SOLN
Status: DC | PRN
  Administered 2018-04-16: 1000 mL

## 2018-04-16 MED ORDER — ROCURONIUM BROMIDE 50 MG/5ML IV SOSY
PREFILLED_SYRINGE | INTRAVENOUS | Status: DC | PRN
  Administered 2018-04-16: 50 mg via INTRAVENOUS

## 2018-04-16 MED ORDER — KCL IN DEXTROSE-NACL 40-5-0.9 MEQ/L-%-% IV SOLN
INTRAVENOUS | Status: DC
  Administered 2018-04-16 – 2018-04-20 (×8): via INTRAVENOUS
  Filled 2018-04-16 (×13): qty 1000

## 2018-04-16 MED ORDER — BUPIVACAINE HCL (PF) 0.25 % IJ SOLN
INTRAMUSCULAR | Status: DC | PRN
  Administered 2018-04-16: 9 mL

## 2018-04-16 MED ORDER — ONDANSETRON HCL 4 MG/2ML IJ SOLN
INTRAMUSCULAR | Status: AC
  Filled 2018-04-16: qty 2

## 2018-04-16 MED ORDER — ONDANSETRON HCL 4 MG/2ML IJ SOLN
INTRAMUSCULAR | Status: DC | PRN
  Administered 2018-04-16: 4 mg via INTRAVENOUS

## 2018-04-16 MED ORDER — HEMOSTATIC AGENTS (NO CHARGE) OPTIME
TOPICAL | Status: DC | PRN
  Administered 2018-04-16: 1 via TOPICAL

## 2018-04-16 MED ORDER — FENTANYL CITRATE (PF) 250 MCG/5ML IJ SOLN
INTRAMUSCULAR | Status: AC
  Filled 2018-04-16: qty 5

## 2018-04-16 MED ORDER — LACTATED RINGERS IV SOLN
INTRAVENOUS | Status: DC
  Administered 2018-04-16: 10:00:00 via INTRAVENOUS

## 2018-04-16 MED ORDER — FENTANYL CITRATE (PF) 100 MCG/2ML IJ SOLN
25.0000 ug | INTRAMUSCULAR | Status: DC | PRN
  Administered 2018-04-16: 50 ug via INTRAVENOUS

## 2018-04-16 MED ORDER — LIDOCAINE 2% (20 MG/ML) 5 ML SYRINGE
INTRAMUSCULAR | Status: AC
  Filled 2018-04-16: qty 5

## 2018-04-16 MED ORDER — ROCURONIUM BROMIDE 50 MG/5ML IV SOSY
PREFILLED_SYRINGE | INTRAVENOUS | Status: AC
  Filled 2018-04-16: qty 5

## 2018-04-16 MED ORDER — PROPOFOL 10 MG/ML IV BOLUS
INTRAVENOUS | Status: DC | PRN
  Administered 2018-04-16: 80 mg via INTRAVENOUS

## 2018-04-16 MED ORDER — PHENYLEPHRINE 40 MCG/ML (10ML) SYRINGE FOR IV PUSH (FOR BLOOD PRESSURE SUPPORT)
PREFILLED_SYRINGE | INTRAVENOUS | Status: AC
  Filled 2018-04-16: qty 10

## 2018-04-16 MED ORDER — MAGNESIUM SULFATE 2 GM/50ML IV SOLN
2.0000 g | Freq: Once | INTRAVENOUS | Status: DC
  Filled 2018-04-16: qty 50

## 2018-04-16 MED ORDER — MORPHINE SULFATE (PF) 2 MG/ML IV SOLN
2.0000 mg | INTRAVENOUS | Status: DC | PRN
  Administered 2018-04-16: 2 mg via INTRAVENOUS
  Filled 2018-04-16 (×2): qty 1

## 2018-04-16 MED ORDER — LIDOCAINE 2% (20 MG/ML) 5 ML SYRINGE
INTRAMUSCULAR | Status: DC | PRN
  Administered 2018-04-16: 50 mg via INTRAVENOUS

## 2018-04-16 MED ORDER — PIPERACILLIN-TAZOBACTAM 3.375 G IVPB
3.3750 g | Freq: Three times a day (TID) | INTRAVENOUS | Status: DC
  Administered 2018-04-16 – 2018-04-22 (×18): 3.375 g via INTRAVENOUS
  Filled 2018-04-16 (×18): qty 50

## 2018-04-16 MED ORDER — SODIUM CHLORIDE 0.9 % IV SOLN
INTRAVENOUS | Status: DC | PRN
  Administered 2018-04-16: 100 mL

## 2018-04-16 MED ORDER — SUGAMMADEX SODIUM 200 MG/2ML IV SOLN
INTRAVENOUS | Status: DC | PRN
  Administered 2018-04-16: 150 mg via INTRAVENOUS

## 2018-04-16 MED ORDER — BUPIVACAINE HCL (PF) 0.25 % IJ SOLN
INTRAMUSCULAR | Status: AC
  Filled 2018-04-16: qty 30

## 2018-04-16 MED ORDER — FENTANYL CITRATE (PF) 100 MCG/2ML IJ SOLN
INTRAMUSCULAR | Status: AC
  Administered 2018-04-16: 50 ug via INTRAVENOUS
  Filled 2018-04-16: qty 2

## 2018-04-16 MED ORDER — ACETAMINOPHEN 10 MG/ML IV SOLN
1000.0000 mg | Freq: Four times a day (QID) | INTRAVENOUS | Status: AC
  Administered 2018-04-16 – 2018-04-17 (×3): 1000 mg via INTRAVENOUS
  Filled 2018-04-16 (×4): qty 100

## 2018-04-16 SURGICAL SUPPLY — 45 items
APPLIER CLIP ROT 10 11.4 M/L (STAPLE) ×3
BENZOIN TINCTURE PRP APPL 2/3 (GAUZE/BANDAGES/DRESSINGS) ×3 IMPLANT
BLADE CLIPPER SURG (BLADE) IMPLANT
CANISTER SUCT 3000ML PPV (MISCELLANEOUS) ×3 IMPLANT
CHLORAPREP W/TINT 26ML (MISCELLANEOUS) ×3 IMPLANT
CLIP APPLIE ROT 10 11.4 M/L (STAPLE) ×1 IMPLANT
CLOSURE WOUND 1/2 X4 (GAUZE/BANDAGES/DRESSINGS) ×1
COVER MAYO STAND STRL (DRAPES) ×3 IMPLANT
COVER SURGICAL LIGHT HANDLE (MISCELLANEOUS) ×3 IMPLANT
COVER WAND RF STERILE (DRAPES) ×3 IMPLANT
DRAPE C-ARM 42X72 X-RAY (DRAPES) ×3 IMPLANT
DRSG TEGADERM 2-3/8X2-3/4 SM (GAUZE/BANDAGES/DRESSINGS) ×3 IMPLANT
DRSG TEGADERM 4X4.75 (GAUZE/BANDAGES/DRESSINGS) ×3 IMPLANT
ELECT REM PT RETURN 9FT ADLT (ELECTROSURGICAL) ×3
ELECTRODE REM PT RTRN 9FT ADLT (ELECTROSURGICAL) ×1 IMPLANT
GAUZE SPONGE 2X2 8PLY STRL LF (GAUZE/BANDAGES/DRESSINGS) ×1 IMPLANT
GLOVE BIO SURGEON STRL SZ7 (GLOVE) ×3 IMPLANT
GLOVE BIOGEL PI IND STRL 7.5 (GLOVE) ×1 IMPLANT
GLOVE BIOGEL PI INDICATOR 7.5 (GLOVE) ×2
GOWN STRL REUS W/ TWL LRG LVL3 (GOWN DISPOSABLE) ×3 IMPLANT
GOWN STRL REUS W/TWL LRG LVL3 (GOWN DISPOSABLE) ×6
KIT BASIN OR (CUSTOM PROCEDURE TRAY) ×3 IMPLANT
KIT TURNOVER KIT B (KITS) ×3 IMPLANT
NS IRRIG 1000ML POUR BTL (IV SOLUTION) ×3 IMPLANT
PAD ARMBOARD 7.5X6 YLW CONV (MISCELLANEOUS) ×3 IMPLANT
POUCH RETRIEVAL ECOSAC 10 (ENDOMECHANICALS) IMPLANT
POUCH RETRIEVAL ECOSAC 10MM (ENDOMECHANICALS)
POUCH SPECIMEN RETRIEVAL 10MM (ENDOMECHANICALS) IMPLANT
SCISSORS LAP 5X35 DISP (ENDOMECHANICALS) ×3 IMPLANT
SET CHOLANGIOGRAPH 5 50 .035 (SET/KITS/TRAYS/PACK) IMPLANT
SET IRRIG TUBING LAPAROSCOPIC (IRRIGATION / IRRIGATOR) ×3 IMPLANT
SET TUBE SMOKE EVAC HIGH FLOW (TUBING) ×3 IMPLANT
SLEEVE ENDOPATH XCEL 5M (ENDOMECHANICALS) ×3 IMPLANT
SPECIMEN JAR SMALL (MISCELLANEOUS) ×3 IMPLANT
SPONGE GAUZE 2X2 STER 10/PKG (GAUZE/BANDAGES/DRESSINGS) ×2
STRIP CLOSURE SKIN 1/2X4 (GAUZE/BANDAGES/DRESSINGS) ×2 IMPLANT
SURGICEL SNOW 2X4 (HEMOSTASIS) ×3 IMPLANT
SUT MNCRL AB 4-0 PS2 18 (SUTURE) ×3 IMPLANT
TOWEL OR 17X24 6PK STRL BLUE (TOWEL DISPOSABLE) ×3 IMPLANT
TOWEL OR 17X26 10 PK STRL BLUE (TOWEL DISPOSABLE) ×3 IMPLANT
TRAY LAPAROSCOPIC MC (CUSTOM PROCEDURE TRAY) ×3 IMPLANT
TROCAR XCEL BLUNT TIP 100MML (ENDOMECHANICALS) ×3 IMPLANT
TROCAR XCEL NON-BLD 11X100MML (ENDOMECHANICALS) ×3 IMPLANT
TROCAR XCEL NON-BLD 5MMX100MML (ENDOMECHANICALS) ×3 IMPLANT
WATER STERILE IRR 1000ML POUR (IV SOLUTION) ×3 IMPLANT

## 2018-04-16 NOTE — Plan of Care (Signed)
Case discussed with surgical team.  Patient with escalating abdominal pain, possible GB perforation on MRCP, and choledocholithiasis (but with only mild biliary ductal dilatation and downtrending LFTs).  I feel gallbladder is most pressing matter, thus have recommended cholecystectomy.  We can revisit tomorrow, and assess need/timing for ERCP.  [facility] will follow.

## 2018-04-16 NOTE — Op Note (Signed)
Laparoscopic Cholecystectomy with IOC Procedure Note  Indications: This patient presents with severe acute cholecystitis and possible choledocholithiasis.  He has been cleared by cardiology and his Eliquis has been held for two days.  He presents now for cholecystectomy.  Pre-operative Diagnosis: Calculus of gallbladder with acute cholecystitis, without mention of obstruction  Post-operative Diagnosis: Same  Surgeon: Wynona Luna   Assistants: Mattie Marlin, PA-C  Anesthesia: General endotracheal anesthesia  ASA Class: 3  Procedure Details  The patient was seen again in the Holding Room. The risks, benefits, complications, treatment options, and expected outcomes were discussed with the patient. The possibilities of reaction to medication, pulmonary aspiration, perforation of viscus, bleeding, recurrent infection, finding a normal gallbladder, the need for additional procedures, failure to diagnose a condition, the possible need to convert to an open procedure, and creating a complication requiring transfusion or operation were discussed with the patient. The likelihood of improving the patient's symptoms with return to their baseline status is good.  The patient and/or family concurred with the proposed plan, giving informed consent. The site of surgery properly noted. The patient was taken to Operating Room, identified as Rodman Key and the procedure verified as Laparoscopic Cholecystectomy with Intraoperative Cholangiogram. A Time Out was held and the above information confirmed.  Prior to the induction of general anesthesia, antibiotic prophylaxis was administered. General endotracheal anesthesia was then administered and tolerated well. After the induction, the abdomen was prepped with Chloraprep and draped in the sterile fashion. The patient was positioned in the supine position.  Local anesthetic agent was injected into the skin near the umbilicus and an incision made. We dissected  down to the abdominal fascia with blunt dissection.  The fascia was incised vertically and we entered the peritoneal cavity bluntly.  A pursestring suture of 0-Vicryl was placed around the fascial opening.  The Hasson cannula was inserted and secured with the stay suture.  Pneumoperitoneum was then created with CO2 and tolerated well without any adverse changes in the patient's vital signs. An 11-mm port was placed in the subxiphoid position.  Two 5-mm ports were placed in the right upper quadrant. All skin incisions were infiltrated with a local anesthetic agent before making the incision and placing the trocars.   We positioned the patient in reverse Trendelenburg, tilted slightly to the patient's left.  The gallbladder was identified.  The liver is densely adherent to the anterior abdominal wall.  The gallbladder is very thickened and distended, but there is no sign of perforation or bile leak.  The fundus grasped and retracted cephalad. Adhesions were lysed bluntly and with the electrocautery where indicated, taking care not to injure any adjacent organs or viscus. The infundibulum was grasped and retracted laterally, exposing the peritoneum overlying the triangle of Calot. This was then divided and exposed in a blunt fashion. A critical view of the cystic duct and cystic artery was obtained.  The cystic duct was clearly identified and bluntly dissected circumferentially. The cystic duct was ligated with a clip distally.   An incision was made in the cystic duct and the [facility] cholangiogram catheter introduced. The catheter was secured using a clip. A cholangiogram was then obtained which showed good visualization of the distal and proximal biliary tree with no sign of obstruction.  There may be a small filling defect in the distal CBD but contrast flowed easily into the duodenum. The catheter was then removed.  The cystic duct was then ligated with clips and divided. The cystic artery was  identified,  dissected free, ligated with clips and divided as well.   The gallbladder was dissected from the liver bed in retrograde fashion with the electrocautery. The gallbladder was removed and placed in an Eco sac. The liver bed was irrigated and inspected. Hemostasis was achieved with the electrocautery and SNOW. Copious irrigation was utilized and was repeatedly aspirated until clear.  The gallbladder and Eco sac were then removed through the umbilical port site.  The pursestring suture was used to close the umbilical fascia.    We again inspected the right upper quadrant for hemostasis.  Pneumoperitoneum was released as we removed the trocars.  4-0 Monocryl was used to close the skin.   Benzoin, steri-strips, and clean dressings were applied. The patient was then extubated and brought to the recovery room in stable condition. Instrument, sponge, and needle counts were correct at closure and at the conclusion of the case.   Findings: Severe acute cholecystitis with Cholelithiasis/ possible filling defect on IOC  Estimated Blood Loss: less than 100 mL         Drains: none         Specimens: Gallbladder           Complications: None; patient tolerated the procedure well.         Disposition: PACU - hemodynamically stable.         Condition: stable  Wilmon Arms. Corliss Skains, MD, [facility]  General/ Trauma Surgery Beeper [phone]  04/16/2018 12:06 PM

## 2018-04-16 NOTE — Transfer of Care (Signed)
Immediate Anesthesia Transfer of Care Note  Patient: Vincent Barrett  Procedure(s) Performed: LAPAROSCOPIC CHOLECYSTECTOMY WITH INTRAOPERATIVE CHOLANGIOGRAM (N/A Abdomen)  Patient Location: PACU  Anesthesia Type:General  Level of Consciousness: drowsy and patient cooperative  Airway & Oxygen Therapy: Patient Spontanous Breathing  Post-op Assessment: Report given to RN and Post -op Vital signs reviewed and stable  Post vital signs: Reviewed and stable  Last Vitals:  Vitals Value Taken Time  BP 104/56 04/16/2018 12:16 PM  Temp    Pulse 88 04/16/2018 12:18 PM  Resp 20 04/16/2018 12:18 PM  SpO2 94 % 04/16/2018 12:18 PM  Vitals shown include unvalidated device data.  Last Pain:  Vitals:   04/16/18 0955  TempSrc: Oral  PainSc:       Patients Stated Pain Goal: 2 (04/16/18 0757)  Complications: No apparent anesthesia complications

## 2018-04-16 NOTE — Anesthesia Procedure Notes (Signed)
Procedure Name: Intubation Date/Time: 04/16/2018 10:47 AM Performed by: Rosiland Oz, CRNA Pre-anesthesia Checklist: Patient identified, Emergency Drugs available, Suction available, Patient being monitored and Timeout performed Patient Re-evaluated:Patient Re-evaluated prior to induction Oxygen Delivery Method: Circle system utilized Preoxygenation: Pre-oxygenation with 100% oxygen Induction Type: IV induction Ventilation: Mask ventilation without difficulty Laryngoscope Size: Miller and 3 Grade View: Grade I Tube type: Oral Tube size: 7.5 mm Number of attempts: 1 Airway Equipment and Method: Stylet Placement Confirmation: ETT inserted through vocal cords under direct vision,  positive ETCO2 and breath sounds checked- equal and bilateral Secured at: 21 cm Tube secured with: Tape Dental Injury: Teeth and Oropharynx as per pre-operative assessment

## 2018-04-16 NOTE — Progress Notes (Addendum)
CC: Abdominal pain  Subjective: Patient is lying in bed complaining of cold and his entire abdomen is tender and painful.  Especially painful on the right side.  I asked him if he could pick a place it was most tender and he could not but on exam he is clearly more tender on the right probably the right upper quadrant than any place else.  No nausea or vomiting.  He has had nothing for pain recently.  Discussed with the nursing staff and they are going to get him some pain medicine.  Objective: Vital signs in last 24 hours: Temp:  [98.8 F (37.1 C)-99.5 F (37.5 C)] 99.5 F (37.5 C) (03/06 0542) Pulse Rate:  [88-106] 88 (03/06 0542) Resp:  [16-28] 16 (03/06 0542) BP: (135-159)/(63-84) 143/84 (03/06 0542) SpO2:  [94 %-96 %] 94 % (03/06 0542) Last BM Date: 04/14/18 NPO 1537 IV  Urine x 6 Afebrile, VSS K+ 3.2 Creatinine stable 1.52 LFT's improving WBC is up H/h down some with hydration CT 3/5:  Possible CBD stone; CBD 74mm [place] 3/5:  Gallstone with increased wall thickness;CBD 4.9 mm? Cholecystitis MRI results pending - called 0730 Intake/Output from previous day: 03/05 0701 - 03/06 0700 In: 1573.6 [I.V.:1573.6] Out: -  Intake/Output this shift: No intake/output data recorded.  General appearance: alert, cooperative and no distress Resp: clear to auscultation bilaterally GI: he is extremely tender all over but more so on the left, he didn't ask for pain medicines earlier but he is clearly very tender.    Lab Results:  Recent Labs    04/14/18 1919 04/16/18 0203  WBC 13.8* 21.8*  HGB 13.7 11.9*  HCT 39.8 34.3*  PLT 225 189    BMET Recent Labs    04/14/18 1919 04/16/18 0203  NA 134* 135  K 3.2* 3.2*  CL 99 105  CO2 21* 21*  GLUCOSE 168* 135*  BUN 21 19  CREATININE 1.54* 1.52*  CALCIUM 9.3 7.8*   PT/INR No results for input(s): LABPROT, INR in the last 72 hours.  Recent Labs  Lab 04/14/18 1919 04/16/18 0203  AST 284* 66*  ALT 347* 155*  ALKPHOS  158* 96  BILITOT 3.4* 2.0*  PROT 7.7 6.1*  ALBUMIN 3.9 2.9*     Lipase     Component Value Date/Time   LIPASE 23 04/14/2018 1919     Medications: . amLODipine  2.5 mg Oral Daily  . doxazosin  4 mg Oral Daily  . metoprolol succinate  25 mg Oral Daily  . multivitamin  1 tablet Oral Daily  . sodium chloride flush  3 mL Intravenous Once  . vitamin C  1,000 mg Oral Daily   . sodium chloride 125 mL/hr at 04/16/18 0105    Assessment/Plan Hx PAF on chronic apixaban -last dose 04/14/2018 COPD/ Hx tobacco use Hypertension Osteoarthritis CKD  Creatinine 1.48 (03/24/2017) 1.54>>1.52 (3/6) Leukocytosis  13.8>>21.8 (3/6) Hypokalemia K+ 3.2 - check Mag+; add K+ to IV fluids    Acute callus cholecystitis/probable choledocholithiasis Elevated LFTs - improving  FEN: IV fluids/n.p.o. ID: Zosyn times one 3/5 DVT:  SCD Follow up:  TBD  Plan:  I have called radiology, they assure me they will put it as a priority to read ASAP. ADD K+ to the IV and start replacing K+, check magnesium, restart Zosyn this AM.  Is n.p.o. for possible ERCP or surgery depending on the MRCP results.  MRCP results obtained shows evidence of acute cholecystitis irregular of the anterior medial wall might  reflect a possible gallbladder perforation.  He also has choledocholithiasis and mild intra-biliary ductal dilatation.  Results reviewed with Dr. Dulce Sellar and Dr. Corliss Skains.  We will plan to take him to the OR now.  I discussed this with patient, his wife, and he is posted in the OR.  Patient's wife and daughter are coming to the hospital now.  Dr. Corliss Skains will see the patient downstairs in holding.     LOS: 1 day    Chenise Mulvihill 04/16/2018 [phone]

## 2018-04-16 NOTE — Progress Notes (Signed)
PROGRESS NOTE    Vincent Barrett  ZOX:[id number] DOB: 15-Apr-1925 DOA: 04/14/2018 PCP: [name], MD   Brief Narrative:  Per admitting physician: Vincent Barrett is a 85 y.o. male with medical history significant of hypertension, paroxysmal A. Fib on Eliquis, chronic kidney disease stage III, BPH who is presenting to the emergency room with abdominal pain of 2 days.  Patient is hard of hearing.  He states that it started hurting about 2 days ago, mostly in the epigastrium and right upper quadrant, no radiation, moderate in intensity, associated with nausea but no vomiting.  No fever chills.  No change in bowel movements.  No similar pain in the past. Patient denies any chest pain.  Denies any palpitations, shortness of breath.  He denies any anginal pain. ED Course: Afebrile in the ER.  Blood pressures are stable.  WBC count 13.8.  Creatinine is 1.54 which is at about his baseline.  LFTs elevated with bilirubin of 3.4.  Right upper quadrant ultrasound shows slightly dilated CBD, pericholecystic fluid.  Patient received 1 dose of Zosyn in the ER.  Patient received IV fluids, antibiotics.  Surgery was consulted. Patient will need GI evaluation    Assessment & Plan:   Principal Problem:   Cholelithiasis with choledocholithiasis Active Problems:   Atrial fibrillation (HCC)   Essential hypertension   COPD (chronic obstructive pulmonary disease) (HCC)   On anticoagulant therapy   Abdominal pain   Hypokalemia   CKD (chronic kidney disease) stage 3, GFR 30-59 ml/min (HCC)   1.  Abdominal pain with abnormal LFTs, cholelithiasis with choledocholithiasis: With obstructive choledocholithiasis pattern.    Patient MRCP today concern for possible ruptured gallbladder with stones noted.  GI discussed with general surgery with plans for emergent cholecystectomy.  GI will follow regarding ERCP.  General surgery added to Zosyn which I agree with continue with fluids follow-up CBC in the morning and  electrolytes.  2.  Paroxysmal A. fib flutter: Currently on a flutter.  Rate controlled.  On beta-blockers will continue.  Holding Eliquis.    Patient was seen by cardiology per general surgery request no additional recommendations.  3.  Hypertension: Fairly stable.  Holding losartan hydrochlorothiazide preop with back and pending pressure stabilization postoperatively,, will continue beta-blockers and doxazosin.   4.  Chronic kidney disease stage III: More or less at his usual levels.  We will continue to monitor on treatment.  Avoid nephrotoxins  5.  Hypokalemia: Replaced by mouth.    Still mildly low at 3.2, fluids changed to include potassium will monitor lites as above.    DVT prophylaxis: SCD/Compression stockings  Code Status: Full    Code Status Orders  (From admission, onward)         Start     Ordered   04/15/18 0809  Full code  Continuous     04/15/18 0808        Code Status History    This patient has a current code status but no historical code status.     Family Communication: None present Disposition Plan:   Patient remained inpatient for acute surgical intervention for acute cholecystitis with possible ruptured gallbladder.  He will require IV antibiotics IV fluids pain management and aggressive postoperative care. Consults called: GI, general surgery Admission status: Inpatient   Consultants:   GI, general surgery  Procedures:  Dg Cholangiogram Operative  Result Date: 04/16/2018 CLINICAL DATA:  Intraoperative cholangiogram during laparoscopic cholecystectomy. EXAM: INTRAOPERATIVE CHOLANGIOGRAM FLUOROSCOPY TIME:  20 seconds COMPARISON:  MRCP-04/15/2018  FINDINGS: Intraoperative cholangiographic images of the right upper abdominal quadrant during laparoscopic cholecystectomy are provided for review. Surgical clips overlie the expected location of the gallbladder fossa. Contrast injection demonstrates selective cannulation of the central aspect of the cystic  duct. There is passage of contrast through the central aspect of the cystic duct with filling of a mildly dilated common bile duct. There is passage of contrast though the CBD and into the descending portion of the duodenum. There is minimal reflux of injected contrast into the common hepatic duct and central aspect of the non dilated intrahepatic biliary system. There is a lenticular shaped nonocclusive filling defect within the distal aspect of the CBD which could be indicative of choledocholithiasis questioned on preceding MRCP. IMPRESSION: Lenticular shaped nonocclusive filling defect within the distal aspect of the CBD, potentially artifactual due to underdistention though could be indicative of nonocclusive choledocholithiasis questioned on preceding MRCP. Further evaluation and management with ERCP could be performed as indicated. Electronically Signed   By: Simonne ComeJohn  Watts M.D.   On: 04/16/2018 11:51   Ct Abdomen Pelvis W Contrast  Addendum Date: 04/15/2018   ADDENDUM REPORT: 04/15/2018 03:42 ADDENDUM: Additional clinical information, jaundice. The patient's CT images are reviewed. Extrahepatic bile duct is slightly enlarged, measuring up to 9 mm. Questionable density within the distal duct at the head of pancreas. Recommend correlation with LFTs. MRCP could be obtained if concern for ductal obstruction is given history of jaundice Electronically Signed   By: Jasmine PangKim  Fujinaga M.D.   On: 04/15/2018 03:42   Result Date: 04/15/2018 CLINICAL DATA:  Abdominal pain with nausea and vomiting EXAM: CT ABDOMEN AND PELVIS WITH CONTRAST TECHNIQUE: Multidetector CT imaging of the abdomen and pelvis was performed using the standard protocol following bolus administration of intravenous contrast. CONTRAST:  80mL OMNIPAQUE IOHEXOL 300 MG/ML  SOLN COMPARISON:  PET CT 01/08/2018, CT 12/30/2017 FINDINGS: Lower chest: Lung bases again demonstrate oval subpleural mass in the left lower lobe, this measures 4.1 x 2.2 cm and appears  slightly increased in size. No significant pleural effusion. Small hiatal hernia. Hepatobiliary: Cyst at the dome of the liver. Calcified stone at the neck of the gallbladder. Suspected edema or fluid adjacent to the gallbladder fundus. No biliary dilatation Pancreas: Unremarkable. No pancreatic ductal dilatation or surrounding inflammatory changes. Spleen: Normal in size without focal abnormality. Adrenals/Urinary Tract: Adrenal glands are unremarkable. Kidneys are normal, without renal calculi, focal lesion, or hydronephrosis. Bladder is unremarkable. Stomach/Bowel: The stomach is nonenlarged. No dilated small bowel. No colon wall thickening. Negative appendix. Colon diverticular disease without acute inflammatory change. Vascular/Lymphatic: Moderate aortic atherosclerosis. No aneurysm. No significantly enlarged lymph nodes. Reproductive: Enlarged heterogeneous prostate with calcification Other: Negative for free air or free fluid Musculoskeletal: Multiple level degenerative changes of the spine. Chronic compression deformity L1. IMPRESSION: 1. 2.3 cm calcified stone at the gallbladder neck. Suspected gallbladder wall edema versus small amount of pericholecystic fluid. Suggest correlation with ultrasound. 2. Slight interval increase in size of subpleural left lower lobe lung mass now measuring 4.1 cm. 3. Diverticular disease of the colon without acute inflammatory change Electronically Signed: By: Jasmine PangKim  Fujinaga M.D. On: 04/15/2018 02:38   Mr Abdomen Mrcp Wo Contrast  Result Date: 04/16/2018 CLINICAL DATA:  85 year old male with history of abdominal pain, concerning for an acute cholecystitis. Additional history of paroxysmal atrial fibrillation and atrial flutter on Eliquis. Hypertension. COPD. EXAM: MRI ABDOMEN WITHOUT CONTRAST  (INCLUDING MRCP) TECHNIQUE: Multiplanar multisequence MR imaging of the abdomen was performed. Heavily T2-weighted images of  the biliary and pancreatic ducts were obtained, and  three-dimensional MRCP images were rendered by post processing. COMPARISON:  No priors. FINDINGS: Comment: Today's study is limited for detection and characterization of visceral and/or vascular lesions by lack of IV gadolinium. Lower chest: Unremarkable. Hepatobiliary: Heterogeneous loss of signal intensity throughout the hepatic parenchyma on out of phase dual echo images, indicative of hepatic steatosis. Several small T1 hypointense, T2 hyperintense lesions are noted in the liver, incompletely characterized on today's noncontrast examination, but presumably small cysts and/or biliary hamartomas, largest of which has some thin internal septations and measures up to 12 mm in segment 4A. MRCP images demonstrate very mild intrahepatic biliary ductal dilatation. Common bile duct is also mildly dilated measuring 8 mm in the porta hepatis. In the distal common bile duct there is a 4 mm filling defect (coronal image 25 of series 12), compatible with choledocholithiasis. There may be another filling defect immediately before the ampulla, however, this is difficult to judge secondary to motion. In addition there is a large signal void in the neck of the gallbladder measuring 2.3 x 1.5 cm, compatible with a gallstone. Some amorphous material lies dependently in the gallbladder and is intermediate intensity on T1 weighted images and slightly low signal intensity on T2 weighted images, compatible with biliary sludge. Gallbladder wall is diffusely thickened and edematous. There is an abnormal appearance of the anteromedial aspect of the wall which is irregular in shape, which may simply reflect a fold, however, the possibility of pending gallbladder perforation in this region is not excluded (best appreciated on axial image 23 of series 10 and 23 of series 7). Increased T2 signal intensity surrounding the gallbladder, indicative of acute inflammation. Pancreas: No pancreatic mass. No pancreatic ductal dilatation. No pancreatic  or peripancreatic fluid or inflammatory changes. Spleen:  Unremarkable. Adrenals/Urinary Tract: Small amount of T2 signal intensity surrounding both kidneys, nonspecific. Unenhanced appearance of the kidneys and bilateral adrenal glands is otherwise unremarkable. Stomach/Bowel: Visualized portions are unremarkable. Vascular/Lymphatic: Aortic atherosclerosis. No aneurysm noted in the abdominal vasculature. Other: Trace amount of pericholecystic fluid. No significant volume of ascites noted in the visualized portions of the peritoneal cavity. Musculoskeletal: No aggressive appearing osseous lesions are noted in the visualized portions of the skeleton. IMPRESSION: 1. Cholelithiasis with evidence of acute cholecystitis. Irregularity of the anteromedial wall of the gallbladder may simply reflect a gallbladder fold, however, the possibility of pending gallbladder perforation is suspected and surgical evaluation is strongly recommended. 2. Choledocholithiasis. Mild intra and extrahepatic biliary ductal dilatation. 3. Heterogeneous hepatic steatosis. 4. Aortic atherosclerosis. These results will be called to the ordering clinician or representative by the Radiologist Assistant, and communication documented in the PACS or zVision Dashboard. Electronically Signed   By: Trudie Reed M.D.   On: 04/16/2018 08:44   Mr 3d Recon At Scanner  Result Date: 04/16/2018 CLINICAL DATA:  85 year old male with history of abdominal pain, concerning for an acute cholecystitis. Additional history of paroxysmal atrial fibrillation and atrial flutter on Eliquis. Hypertension. COPD. EXAM: MRI ABDOMEN WITHOUT CONTRAST  (INCLUDING MRCP) TECHNIQUE: Multiplanar multisequence MR imaging of the abdomen was performed. Heavily T2-weighted images of the biliary and pancreatic ducts were obtained, and three-dimensional MRCP images were rendered by post processing. COMPARISON:  No priors. FINDINGS: Comment: Today's study is limited for detection and  characterization of visceral and/or vascular lesions by lack of IV gadolinium. Lower chest: Unremarkable. Hepatobiliary: Heterogeneous loss of signal intensity throughout the hepatic parenchyma on out of phase dual echo images, indicative of  hepatic steatosis. Several small T1 hypointense, T2 hyperintense lesions are noted in the liver, incompletely characterized on today's noncontrast examination, but presumably small cysts and/or biliary hamartomas, largest of which has some thin internal septations and measures up to 12 mm in segment 4A. MRCP images demonstrate very mild intrahepatic biliary ductal dilatation. Common bile duct is also mildly dilated measuring 8 mm in the porta hepatis. In the distal common bile duct there is a 4 mm filling defect (coronal image 25 of series 12), compatible with choledocholithiasis. There may be another filling defect immediately before the ampulla, however, this is difficult to judge secondary to motion. In addition there is a large signal void in the neck of the gallbladder measuring 2.3 x 1.5 cm, compatible with a gallstone. Some amorphous material lies dependently in the gallbladder and is intermediate intensity on T1 weighted images and slightly low signal intensity on T2 weighted images, compatible with biliary sludge. Gallbladder wall is diffusely thickened and edematous. There is an abnormal appearance of the anteromedial aspect of the wall which is irregular in shape, which may simply reflect a fold, however, the possibility of pending gallbladder perforation in this region is not excluded (best appreciated on axial image 23 of series 10 and 23 of series 7). Increased T2 signal intensity surrounding the gallbladder, indicative of acute inflammation. Pancreas: No pancreatic mass. No pancreatic ductal dilatation. No pancreatic or peripancreatic fluid or inflammatory changes. Spleen:  Unremarkable. Adrenals/Urinary Tract: Small amount of T2 signal intensity surrounding both  kidneys, nonspecific. Unenhanced appearance of the kidneys and bilateral adrenal glands is otherwise unremarkable. Stomach/Bowel: Visualized portions are unremarkable. Vascular/Lymphatic: Aortic atherosclerosis. No aneurysm noted in the abdominal vasculature. Other: Trace amount of pericholecystic fluid. No significant volume of ascites noted in the visualized portions of the peritoneal cavity. Musculoskeletal: No aggressive appearing osseous lesions are noted in the visualized portions of the skeleton. IMPRESSION: 1. Cholelithiasis with evidence of acute cholecystitis. Irregularity of the anteromedial wall of the gallbladder may simply reflect a gallbladder fold, however, the possibility of pending gallbladder perforation is suspected and surgical evaluation is strongly recommended. 2. Choledocholithiasis. Mild intra and extrahepatic biliary ductal dilatation. 3. Heterogeneous hepatic steatosis. 4. Aortic atherosclerosis. These results will be called to the ordering clinician or representative by the Radiologist Assistant, and communication documented in the PACS or zVision Dashboard. Electronically Signed   By: Trudie Reed M.D.   On: 04/16/2018 08:44   US Abdomen Limited  Result Date: 04/15/2018 CLINICAL DATA:  Jaundice EXAM: ULTRASOUND ABDOMEN LIMITED RIGHT UPPER QUADRANT COMPARISON:  CT 04/15/2018 FINDINGS: Gallbladder: Shadowing stone within the gallbladder neck measuring 1.9 cm. Increased wall thickness of 8.8 mm with trace pericholecystic fluid. Negative sonographic Murphy. Small amount of intraluminal sludge. Common bile duct: Diameter: 4.9 mm Liver: Heterogeneous echotexture without focal hepatic abnormality. Portal vein is patent on color Doppler imaging with normal direction of blood flow towards the liver. IMPRESSION: 1. Gallstone with increased wall thickness and small amount of pericholecystic fluid, raising concern for cholecystitis despite negative sonographic Murphy. Electronically Signed    By: [name] M.D.   On: 04/15/2018 03:39     Antimicrobials:   Zosyn day #2   Subjective: Patient with surgical abdomen tender to palpation  Objective: Vitals:   04/15/18 1330 04/15/18 2115 04/16/18 0542 04/16/18 0955  BP: (!) 145/69 135/70 (!) 143/84 (!) 129/58  Pulse: 100 (!) 106 88 (!) 102  Resp: 18  16   Temp: 98.8 F (37.1 C) 99.2 F (37.3 C) 99.5 F (  37.5 C) 98.8 F (37.1 C)  TempSrc: Oral Oral Oral Oral  SpO2: 94% 95% 94%   Weight:    72.6 kg  Height:    5\' 4"  (1.626 m)    Intake/Output Summary (Last 24 hours) at 04/16/2018 1154 Last data filed at 04/16/2018 1153 Gross per 24 hour  Intake 1573.64 ml  Output 25 ml  Net 1548.64 ml   Filed Weights   04/16/18 0955  Weight: 72.6 kg    Examination:  General exam: Appears calm, moderately uncomfortable Respiratory system: Clear to auscultation. Respiratory effort normal. Cardiovascular system: S1 & S2 heard, RRR. No JVD, murmurs, rubs, gallops or clicks. No pedal edema. Gastrointestinal system: Tender to palpation right upper quadrant.  Positive guarding, not rigid Central nervous system: Alert and oriented. No focal neurological deficits. Extremities: Symmetric 5 x 5 power.,  No edema Skin: No rashes, lesions or ulcers Psychiatry: Judgement and insight appear normal. Mood & affect appropriate.     Data Reviewed: I have personally reviewed following labs and imaging studies  CBC: Recent Labs  Lab 04/14/18 1919 04/16/18 0203  WBC 13.8* 21.8*  HGB 13.7 11.9*  HCT 39.8 34.3*  MCV 89.6 89.1  PLT 225 189   Basic Metabolic Panel: Recent Labs  Lab 04/14/18 1919 04/16/18 0203  NA 134* 135  K 3.2* 3.2*  CL 99 105  CO2 21* 21*  GLUCOSE 168* 135*  BUN 21 19  CREATININE 1.54* 1.52*  CALCIUM 9.3 7.8*  MG  --  1.6*   GFR: Estimated Creatinine Clearance: 29.5 mL/min (A) (by C-G formula based on SCr of 1.52 mg/dL (H)). Liver Function Tests: Recent Labs  Lab 04/14/18 1919 04/16/18 0203  AST  284* 66*  ALT 347* 155*  ALKPHOS 158* 96  BILITOT 3.4* 2.0*  PROT 7.7 6.1*  ALBUMIN 3.9 2.9*   Recent Labs  Lab 04/14/18 1919 04/16/18 0203  LIPASE 23 18   No results for input(s): AMMONIA in the last 168 hours. Coagulation Profile: No results for input(s): INR, PROTIME in the last 168 hours. Cardiac Enzymes: No results for input(s): CKTOTAL, CKMB, CKMBINDEX, TROPONINI in the last 168 hours. BNP (last 3 results) No results for input(s): PROBNP in the last 8760 hours. HbA1C: No results for input(s): HGBA1C in the last 72 hours. CBG: No results for input(s): GLUCAP in the last 168 hours. Lipid Profile: No results for input(s): CHOL, HDL, LDLCALC, TRIG, CHOLHDL, LDLDIRECT in the last 72 hours. Thyroid Function Tests: No results for input(s): TSH, T4TOTAL, FREET4, T3FREE, THYROIDAB in the last 72 hours. Anemia Panel: No results for input(s): VITAMINB12, FOLATE, FERRITIN, TIBC, IRON, RETICCTPCT in the last 72 hours. Sepsis Labs: No results for input(s): PROCALCITON, LATICACIDVEN in the last 168 hours.  Recent Results (from the past 240 hour(s))  Surgical pcr screen     Status: None   Collection Time: 04/16/18  9:35 AM  Result Value Ref Range Status   MRSA, PCR NEGATIVE NEGATIVE Final   Staphylococcus aureus NEGATIVE NEGATIVE Final    Comment: (NOTE) The Xpert SA Assay (FDA approved for NASAL specimens in patients 70 years of age and older), is one component of a comprehensive surveillance program. It is not intended to diagnose infection nor to guide or monitor treatment. Performed at [facility], [address] [place], [place] [address]          Radiology Studies: Dg Cholangiogram Operative  Result Date: 04/16/2018 CLINICAL DATA:  Intraoperative cholangiogram during laparoscopic cholecystectomy. EXAM: INTRAOPERATIVE CHOLANGIOGRAM FLUOROSCOPY  TIME:  20 seconds COMPARISON:  MRCP-04/15/2018 FINDINGS: Intraoperative cholangiographic images of the right upper  abdominal quadrant during laparoscopic cholecystectomy are provided for review. Surgical clips overlie the expected location of the gallbladder fossa. Contrast injection demonstrates selective cannulation of the central aspect of the cystic duct. There is passage of contrast through the central aspect of the cystic duct with filling of a mildly dilated common bile duct. There is passage of contrast though the CBD and into the descending portion of the duodenum. There is minimal reflux of injected contrast into the common hepatic duct and central aspect of the non dilated intrahepatic biliary system. There is a lenticular shaped nonocclusive filling defect within the distal aspect of the CBD which could be indicative of choledocholithiasis questioned on preceding MRCP. IMPRESSION: Lenticular shaped nonocclusive filling defect within the distal aspect of the CBD, potentially artifactual due to underdistention though could be indicative of nonocclusive choledocholithiasis questioned on preceding MRCP. Further evaluation and management with ERCP could be performed as indicated. Electronically Signed   By: Simonne Come M.D.   On: 04/16/2018 11:51   Ct Abdomen Pelvis W Contrast  Addendum Date: 04/15/2018   ADDENDUM REPORT: 04/15/2018 03:42 ADDENDUM: Additional clinical information, jaundice. The patient's CT images are reviewed. Extrahepatic bile duct is slightly enlarged, measuring up to 9 mm. Questionable density within the distal duct at the head of pancreas. Recommend correlation with LFTs. MRCP could be obtained if concern for ductal obstruction is given history of jaundice Electronically Signed   By: [name] M.D.   On: 04/15/2018 03:42   Result Date: 04/15/2018 CLINICAL DATA:  Abdominal pain with nausea and vomiting EXAM: CT ABDOMEN AND PELVIS WITH CONTRAST TECHNIQUE: Multidetector CT imaging of the abdomen and pelvis was performed using the standard protocol following bolus administration of intravenous  contrast. CONTRAST:  80mL OMNIPAQUE IOHEXOL 300 MG/ML  SOLN COMPARISON:  PET CT 01/08/2018, CT 12/30/2017 FINDINGS: Lower chest: Lung bases again demonstrate oval subpleural mass in the left lower lobe, this measures 4.1 x 2.2 cm and appears slightly increased in size. No significant pleural effusion. Small hiatal hernia. Hepatobiliary: Cyst at the dome of the liver. Calcified stone at the neck of the gallbladder. Suspected edema or fluid adjacent to the gallbladder fundus. No biliary dilatation Pancreas: Unremarkable. No pancreatic ductal dilatation or surrounding inflammatory changes. Spleen: Normal in size without focal abnormality. Adrenals/Urinary Tract: Adrenal glands are unremarkable. Kidneys are normal, without renal calculi, focal lesion, or hydronephrosis. Bladder is unremarkable. Stomach/Bowel: The stomach is nonenlarged. No dilated small bowel. No colon wall thickening. Negative appendix. Colon diverticular disease without acute inflammatory change. Vascular/Lymphatic: Moderate aortic atherosclerosis. No aneurysm. No significantly enlarged lymph nodes. Reproductive: Enlarged heterogeneous prostate with calcification Other: Negative for free air or free fluid Musculoskeletal: Multiple level degenerative changes of the spine. Chronic compression deformity L1. IMPRESSION: 1. 2.3 cm calcified stone at the gallbladder neck. Suspected gallbladder wall edema versus small amount of pericholecystic fluid. Suggest correlation with ultrasound. 2. Slight interval increase in size of subpleural left lower lobe lung mass now measuring 4.1 cm. 3. Diverticular disease of the colon without acute inflammatory change Electronically Signed: By: [name] M.D. On: 04/15/2018 02:38   Mr Abdomen Mrcp Wo Contrast  Result Date: 04/16/2018 CLINICAL DATA:  85 year old male with history of abdominal pain, concerning for an acute cholecystitis. Additional history of paroxysmal atrial fibrillation and atrial flutter on  Eliquis. Hypertension. COPD. EXAM: MRI ABDOMEN WITHOUT CONTRAST  (INCLUDING MRCP) TECHNIQUE: Multiplanar multisequence MR imaging of  the abdomen was performed. Heavily T2-weighted images of the biliary and pancreatic ducts were obtained, and three-dimensional MRCP images were rendered by post processing. COMPARISON:  No priors. FINDINGS: Comment: Today's study is limited for detection and characterization of visceral and/or vascular lesions by lack of IV gadolinium. Lower chest: Unremarkable. Hepatobiliary: Heterogeneous loss of signal intensity throughout the hepatic parenchyma on out of phase dual echo images, indicative of hepatic steatosis. Several small T1 hypointense, T2 hyperintense lesions are noted in the liver, incompletely characterized on today's noncontrast examination, but presumably small cysts and/or biliary hamartomas, largest of which has some thin internal septations and measures up to 12 mm in segment 4A. MRCP images demonstrate very mild intrahepatic biliary ductal dilatation. Common bile duct is also mildly dilated measuring 8 mm in the porta hepatis. In the distal common bile duct there is a 4 mm filling defect (coronal image 25 of series 12), compatible with choledocholithiasis. There may be another filling defect immediately before the ampulla, however, this is difficult to judge secondary to motion. In addition there is a large signal void in the neck of the gallbladder measuring 2.3 x 1.5 cm, compatible with a gallstone. Some amorphous material lies dependently in the gallbladder and is intermediate intensity on T1 weighted images and slightly low signal intensity on T2 weighted images, compatible with biliary sludge. Gallbladder wall is diffusely thickened and edematous. There is an abnormal appearance of the anteromedial aspect of the wall which is irregular in shape, which may simply reflect a fold, however, the possibility of pending gallbladder perforation in this region is not  excluded (best appreciated on axial image 23 of series 10 and 23 of series 7). Increased T2 signal intensity surrounding the gallbladder, indicative of acute inflammation. Pancreas: No pancreatic mass. No pancreatic ductal dilatation. No pancreatic or peripancreatic fluid or inflammatory changes. Spleen:  Unremarkable. Adrenals/Urinary Tract: Small amount of T2 signal intensity surrounding both kidneys, nonspecific. Unenhanced appearance of the kidneys and bilateral adrenal glands is otherwise unremarkable. Stomach/Bowel: Visualized portions are unremarkable. Vascular/Lymphatic: Aortic atherosclerosis. No aneurysm noted in the abdominal vasculature. Other: Trace amount of pericholecystic fluid. No significant volume of ascites noted in the visualized portions of the peritoneal cavity. Musculoskeletal: No aggressive appearing osseous lesions are noted in the visualized portions of the skeleton. IMPRESSION: 1. Cholelithiasis with evidence of acute cholecystitis. Irregularity of the anteromedial wall of the gallbladder may simply reflect a gallbladder fold, however, the possibility of pending gallbladder perforation is suspected and surgical evaluation is strongly recommended. 2. Choledocholithiasis. Mild intra and extrahepatic biliary ductal dilatation. 3. Heterogeneous hepatic steatosis. 4. Aortic atherosclerosis. These results will be called to the ordering clinician or representative by the Radiologist Assistant, and communication documented in the PACS or zVision Dashboard. Electronically Signed   By: Trudie Reed M.D.   On: 04/16/2018 08:44   Mr 3d Recon At Scanner  Result Date: 04/16/2018 CLINICAL DATA:  85 year old male with history of abdominal pain, concerning for an acute cholecystitis. Additional history of paroxysmal atrial fibrillation and atrial flutter on Eliquis. Hypertension. COPD. EXAM: MRI ABDOMEN WITHOUT CONTRAST  (INCLUDING MRCP) TECHNIQUE: Multiplanar multisequence MR imaging of the  abdomen was performed. Heavily T2-weighted images of the biliary and pancreatic ducts were obtained, and three-dimensional MRCP images were rendered by post processing. COMPARISON:  No priors. FINDINGS: Comment: Today's study is limited for detection and characterization of visceral and/or vascular lesions by lack of IV gadolinium. Lower chest: Unremarkable. Hepatobiliary: Heterogeneous loss of signal intensity throughout the hepatic parenchyma on out  of phase dual echo images, indicative of hepatic steatosis. Several small T1 hypointense, T2 hyperintense lesions are noted in the liver, incompletely characterized on today's noncontrast examination, but presumably small cysts and/or biliary hamartomas, largest of which has some thin internal septations and measures up to 12 mm in segment 4A. MRCP images demonstrate very mild intrahepatic biliary ductal dilatation. Common bile duct is also mildly dilated measuring 8 mm in the porta hepatis. In the distal common bile duct there is a 4 mm filling defect (coronal image 25 of series 12), compatible with choledocholithiasis. There may be another filling defect immediately before the ampulla, however, this is difficult to judge secondary to motion. In addition there is a large signal void in the neck of the gallbladder measuring 2.3 x 1.5 cm, compatible with a gallstone. Some amorphous material lies dependently in the gallbladder and is intermediate intensity on T1 weighted images and slightly low signal intensity on T2 weighted images, compatible with biliary sludge. Gallbladder wall is diffusely thickened and edematous. There is an abnormal appearance of the anteromedial aspect of the wall which is irregular in shape, which may simply reflect a fold, however, the possibility of pending gallbladder perforation in this region is not excluded (best appreciated on axial image 23 of series 10 and 23 of series 7). Increased T2 signal intensity surrounding the gallbladder,  indicative of acute inflammation. Pancreas: No pancreatic mass. No pancreatic ductal dilatation. No pancreatic or peripancreatic fluid or inflammatory changes. Spleen:  Unremarkable. Adrenals/Urinary Tract: Small amount of T2 signal intensity surrounding both kidneys, nonspecific. Unenhanced appearance of the kidneys and bilateral adrenal glands is otherwise unremarkable. Stomach/Bowel: Visualized portions are unremarkable. Vascular/Lymphatic: Aortic atherosclerosis. No aneurysm noted in the abdominal vasculature. Other: Trace amount of pericholecystic fluid. No significant volume of ascites noted in the visualized portions of the peritoneal cavity. Musculoskeletal: No aggressive appearing osseous lesions are noted in the visualized portions of the skeleton. IMPRESSION: 1. Cholelithiasis with evidence of acute cholecystitis. Irregularity of the anteromedial wall of the gallbladder may simply reflect a gallbladder fold, however, the possibility of pending gallbladder perforation is suspected and surgical evaluation is strongly recommended. 2. Choledocholithiasis. Mild intra and extrahepatic biliary ductal dilatation. 3. Heterogeneous hepatic steatosis. 4. Aortic atherosclerosis. These results will be called to the ordering clinician or representative by the Radiologist Assistant, and communication documented in the PACS or zVision Dashboard. Electronically Signed   By: Trudie Reed M.D.   On: 04/16/2018 08:44   US Abdomen Limited  Result Date: 04/15/2018 CLINICAL DATA:  Jaundice EXAM: ULTRASOUND ABDOMEN LIMITED RIGHT UPPER QUADRANT COMPARISON:  CT 04/15/2018 FINDINGS: Gallbladder: Shadowing stone within the gallbladder neck measuring 1.9 cm. Increased wall thickness of 8.8 mm with trace pericholecystic fluid. Negative sonographic Murphy. Small amount of intraluminal sludge. Common bile duct: Diameter: 4.9 mm Liver: Heterogeneous echotexture without focal hepatic abnormality. Portal vein is patent on color  Doppler imaging with normal direction of blood flow towards the liver. IMPRESSION: 1. Gallstone with increased wall thickness and small amount of pericholecystic fluid, raising concern for cholecystitis despite negative sonographic Murphy. Electronically Signed   By: [name] M.D.   On: 04/15/2018 03:39        Scheduled Meds: . [MAR Hold] amLODipine  2.5 mg Oral Daily  . [MAR Hold] doxazosin  4 mg Oral Daily  . [MAR Hold] metoprolol succinate  25 mg Oral Daily  . [MAR Hold] multivitamin  1 tablet Oral Daily  . [MAR Hold] sodium chloride flush  3 mL Intravenous Once  . [  MAR Hold] vitamin C  1,000 mg Oral Daily   Continuous Infusions: . [MAR Hold] acetaminophen 1,000 mg (04/16/18 0807)  . dextrose 5 % and 0.9 % NaCl with KCl 40 mEq/L    . lactated ringers 10 mL/hr at 04/16/18 1004  . [MAR Hold] magnesium sulfate 1 - 4 g bolus IVPB    . [MAR Hold] piperacillin-tazobactam (ZOSYN)  IV 3.375 g (04/16/18 1005)     LOS: 1 day    Time spent: 35 min    Burke Keels, MD [facility]  If 7PM-7AM, please contact night-coverage  04/16/2018, 11:54 AM

## 2018-04-16 NOTE — Anesthesia Preprocedure Evaluation (Addendum)
Anesthesia Evaluation  Patient identified by MRN, date of birth, ID band Patient awake    Reviewed: Allergy & Precautions, NPO status , Patient's Chart, lab work & pertinent test results  Airway Mallampati: I  TM Distance: >3 FB Neck ROM: Full    Dental  (+) Upper Dentures, Lower Dentures   Pulmonary COPD, former smoker,    breath sounds clear to auscultation       Cardiovascular hypertension, Pt. on medications and Pt. on home beta blockers + dysrhythmias Atrial Fibrillation  Rhythm:Regular Rate:Normal     Neuro/Psych negative neurological ROS     GI/Hepatic negative GI ROS, Neg liver ROS,   Endo/Other  negative endocrine ROS  Renal/GU      Musculoskeletal  (+) Arthritis ,   Abdominal Normal abdominal exam  (+)   Peds  Hematology negative hematology ROS (+)   Anesthesia Other Findings   Reproductive/Obstetrics                            Anesthesia Physical Anesthesia Plan  ASA: III  Anesthesia Plan: General   Post-op Pain Management:    Induction: Intravenous  PONV Risk Score and Plan: 3 and Ondansetron and Treatment may vary due to age or medical condition  Airway Management Planned: Oral ETT  Additional Equipment: None  Intra-op Plan:   Post-operative Plan: Extubation in OR  Informed Consent: I have reviewed the patients History and Physical, chart, labs and discussed the procedure including the risks, benefits and alternatives for the proposed anesthesia with the patient or authorized representative who has indicated his/her understanding and acceptance.     Dental advisory given  Plan Discussed with: CRNA  Anesthesia Plan Comments:        Anesthesia Quick Evaluation

## 2018-04-17 ENCOUNTER — Inpatient Hospital Stay (HOSPITAL_COMMUNITY): Payer: Medicare Other

## 2018-04-17 LAB — URINE CULTURE: Culture: NO GROWTH

## 2018-04-17 LAB — CBC
HCT: 30.1 % — ABNORMAL LOW (ref 39.0–52.0)
Hemoglobin: 10 g/dL — ABNORMAL LOW (ref 13.0–17.0)
MCH: 30.5 pg (ref 26.0–34.0)
MCHC: 33.2 g/dL (ref 30.0–36.0)
MCV: 91.8 fL (ref 80.0–100.0)
Platelets: 156 10*3/uL (ref 150–400)
RBC: 3.28 MIL/uL — ABNORMAL LOW (ref 4.22–5.81)
RDW: 12.7 % (ref 11.5–15.5)
WBC: 13.2 10*3/uL — ABNORMAL HIGH (ref 4.0–10.5)
nRBC: 0 % (ref 0.0–0.2)

## 2018-04-17 LAB — COMPREHENSIVE METABOLIC PANEL
ALT: 124 U/L — ABNORMAL HIGH (ref 0–44)
AST: 75 U/L — ABNORMAL HIGH (ref 15–41)
Albumin: 2.2 g/dL — ABNORMAL LOW (ref 3.5–5.0)
Alkaline Phosphatase: 89 U/L (ref 38–126)
Anion gap: 7 (ref 5–15)
BUN: 25 mg/dL — AB (ref 8–23)
CO2: 22 mmol/L (ref 22–32)
Calcium: 7.5 mg/dL — ABNORMAL LOW (ref 8.9–10.3)
Chloride: 107 mmol/L (ref 98–111)
Creatinine, Ser: 1.59 mg/dL — ABNORMAL HIGH (ref 0.61–1.24)
GFR calc Af Amer: 44 mL/min — ABNORMAL LOW (ref >60)
GFR calc non Af Amer: 38 mL/min — ABNORMAL LOW (ref >60)
Glucose, Bld: 155 mg/dL — ABNORMAL HIGH (ref 70–99)
Potassium: 3.8 mmol/L (ref 3.5–5.1)
Sodium: 136 mmol/L (ref 135–145)
Total Bilirubin: 1.9 mg/dL — ABNORMAL HIGH (ref 0.3–1.2)
Total Protein: 5.4 g/dL — ABNORMAL LOW (ref 6.5–8.1)

## 2018-04-17 MED ORDER — ACETAMINOPHEN 325 MG PO TABS
650.0000 mg | ORAL_TABLET | Freq: Four times a day (QID) | ORAL | Status: DC | PRN
  Administered 2018-04-17: 325 mg via ORAL
  Administered 2018-04-21: 650 mg via ORAL
  Filled 2018-04-17 (×2): qty 2

## 2018-04-17 MED ORDER — HYDROCODONE-ACETAMINOPHEN 5-325 MG PO TABS
1.0000 | ORAL_TABLET | ORAL | Status: DC | PRN
  Administered 2018-04-17 – 2018-04-22 (×4): 1 via ORAL
  Filled 2018-04-17 (×4): qty 1

## 2018-04-17 MED ORDER — MORPHINE SULFATE (PF) 2 MG/ML IV SOLN
1.0000 mg | INTRAVENOUS | Status: DC | PRN
  Administered 2018-04-17 – 2018-04-18 (×2): 1 mg via INTRAVENOUS
  Administered 2018-04-19 – 2018-04-22 (×3): 2 mg via INTRAVENOUS
  Filled 2018-04-17 (×5): qty 1

## 2018-04-17 NOTE — Progress Notes (Signed)
[facility]/Trauma Progress Note  1 Day Post-Op   Assessment/Plan Hx PAF on chronic apixaban-last dose 3/4 COPD/Hx tobacco use Hypertension Osteoarthritis CKD  Acute cholecystitis  - S/P lap chole with IOC, Dr. Corliss Skains, 03/06 - continue IV abx Possible choledocholithiasis - IOC showed nonocclusive filling defect, further management per GI - LFT's trending down, Tbili down to 1.9 from 2.0 Possible Ileus - AXR pending, keep on CLD, ambulate  FEN: IVF, CLD UP:JSRPR 3/5>>   WBC 13.2 down from 21.8 DVT: SCD Follow up: TBD    LOS: 2 days    Subjective: CC: tired, not feeling well  Pt states he slept well last night but he is tired and wants to sleep. He needs a shave. He is having nausea and hiccups. He states he had flatus this am. He denies vomiting. His abdomen is sore and bloated. No family at bedside.   Objective: Vital signs in last 24 hours: Temp:  [97.7 F (36.5 C)-99.2 F (37.3 C)] 98.5 F (36.9 C) (03/07 0545) Pulse Rate:  [88-91] 90 (03/07 0545) Resp:  [15-21] 21 (03/07 0545) BP: (110-132)/(53-65) 132/65 (03/07 0545) SpO2:  [93 %-97 %] 96 % (03/07 0545) Last BM Date: 04/14/18  Intake/Output from previous day: 03/06 0701 - 03/07 0700 In: 2022.6 [I.V.:1631.8; IV Piggyback:390.8] Out: 275 [Urine:250; Blood:25] Intake/Output this shift: No intake/output data recorded.  PE: Gen:  Alert, NAD, pleasant, ill appearing Pulm:  Rate and effort normal Abd: Soft, distended, incisions with dressings intact, +BS, generalized TTP with guarding, no peritonitis  Skin: warm and dry   Anti-infectives: Anti-infectives (From admission, onward)   Start     Dose/Rate Route Frequency Ordered Stop   04/16/18 0830  piperacillin-tazobactam (ZOSYN) IVPB 3.375 g     3.375 g 12.5 mL/hr over 240 Minutes Intravenous Every 8 hours 04/16/18 0755     04/15/18 0315  piperacillin-tazobactam (ZOSYN) IVPB 3.375 g     3.375 g 100 mL/hr over 30 Minutes Intravenous   Once 04/15/18 0305 04/15/18 0514      Lab Results:  Recent Labs    04/16/18 0203 04/17/18 0326  WBC 21.8* 13.2*  HGB 11.9* 10.0*  HCT 34.3* 30.1*  PLT 189 156   BMET Recent Labs    04/16/18 0203 04/17/18 0326  NA 135 136  K 3.2* 3.8  CL 105 107  CO2 21* 22  GLUCOSE 135* 155*  BUN 19 25*  CREATININE 1.52* 1.59*  CALCIUM 7.8* 7.5*   PT/INR No results for input(s): LABPROT, INR in the last 72 hours. CMP     Component Value Date/Time   NA 136 04/17/2018 0326   NA 138 03/17/2018 1554   K 3.8 04/17/2018 0326   CL 107 04/17/2018 0326   CO2 22 04/17/2018 0326   GLUCOSE 155 (H) 04/17/2018 0326   BUN 25 (H) 04/17/2018 0326   BUN 20 03/17/2018 1554   CREATININE 1.59 (H) 04/17/2018 0326   CALCIUM 7.5 (L) 04/17/2018 0326   PROT 5.4 (L) 04/17/2018 0326   ALBUMIN 2.2 (L) 04/17/2018 0326   AST 75 (H) 04/17/2018 0326   ALT 124 (H) 04/17/2018 0326   ALKPHOS 89 04/17/2018 0326   BILITOT 1.9 (H) 04/17/2018 0326   GFRNONAA 38 (L) 04/17/2018 0326   GFRAA 44 (L) 04/17/2018 0326   Lipase     Component Value Date/Time   LIPASE 18 04/16/2018 0203    Studies/Results: Dg Cholangiogram Operative  Result Date: 04/16/2018 CLINICAL DATA:  Intraoperative cholangiogram during laparoscopic cholecystectomy. EXAM: INTRAOPERATIVE CHOLANGIOGRAM  FLUOROSCOPY TIME:  20 seconds COMPARISON:  MRCP-04/15/2018 FINDINGS: Intraoperative cholangiographic images of the right upper abdominal quadrant during laparoscopic cholecystectomy are provided for review. Surgical clips overlie the expected location of the gallbladder fossa. Contrast injection demonstrates selective cannulation of the central aspect of the cystic duct. There is passage of contrast through the central aspect of the cystic duct with filling of a mildly dilated common bile duct. There is passage of contrast though the CBD and into the descending portion of the duodenum. There is minimal reflux of injected contrast into the common hepatic  duct and central aspect of the non dilated intrahepatic biliary system. There is a lenticular shaped nonocclusive filling defect within the distal aspect of the CBD which could be indicative of choledocholithiasis questioned on preceding MRCP. IMPRESSION: Lenticular shaped nonocclusive filling defect within the distal aspect of the CBD, potentially artifactual due to underdistention though could be indicative of nonocclusive choledocholithiasis questioned on preceding MRCP. Further evaluation and management with ERCP could be performed as indicated. Electronically Signed   By: Simonne Come M.D.   On: 04/16/2018 11:51   Mr Abdomen Mrcp Wo Contrast  Result Date: 04/16/2018 CLINICAL DATA:  85 year old male with history of abdominal pain, concerning for an acute cholecystitis. Additional history of paroxysmal atrial fibrillation and atrial flutter on Eliquis. Hypertension. COPD. EXAM: MRI ABDOMEN WITHOUT CONTRAST  (INCLUDING MRCP) TECHNIQUE: Multiplanar multisequence MR imaging of the abdomen was performed. Heavily T2-weighted images of the biliary and pancreatic ducts were obtained, and three-dimensional MRCP images were rendered by post processing. COMPARISON:  No priors. FINDINGS: Comment: Today's study is limited for detection and characterization of visceral and/or vascular lesions by lack of IV gadolinium. Lower chest: Unremarkable. Hepatobiliary: Heterogeneous loss of signal intensity throughout the hepatic parenchyma on out of phase dual echo images, indicative of hepatic steatosis. Several small T1 hypointense, T2 hyperintense lesions are noted in the liver, incompletely characterized on today's noncontrast examination, but presumably small cysts and/or biliary hamartomas, largest of which has some thin internal septations and measures up to 12 mm in segment 4A. MRCP images demonstrate very mild intrahepatic biliary ductal dilatation. Common bile duct is also mildly dilated measuring 8 mm in the porta  hepatis. In the distal common bile duct there is a 4 mm filling defect (coronal image 25 of series 12), compatible with choledocholithiasis. There may be another filling defect immediately before the ampulla, however, this is difficult to judge secondary to motion. In addition there is a large signal void in the neck of the gallbladder measuring 2.3 x 1.5 cm, compatible with a gallstone. Some amorphous material lies dependently in the gallbladder and is intermediate intensity on T1 weighted images and slightly low signal intensity on T2 weighted images, compatible with biliary sludge. Gallbladder wall is diffusely thickened and edematous. There is an abnormal appearance of the anteromedial aspect of the wall which is irregular in shape, which may simply reflect a fold, however, the possibility of pending gallbladder perforation in this region is not excluded (best appreciated on axial image 23 of series 10 and 23 of series 7). Increased T2 signal intensity surrounding the gallbladder, indicative of acute inflammation. Pancreas: No pancreatic mass. No pancreatic ductal dilatation. No pancreatic or peripancreatic fluid or inflammatory changes. Spleen:  Unremarkable. Adrenals/Urinary Tract: Small amount of T2 signal intensity surrounding both kidneys, nonspecific. Unenhanced appearance of the kidneys and bilateral adrenal glands is otherwise unremarkable. Stomach/Bowel: Visualized portions are unremarkable. Vascular/Lymphatic: Aortic atherosclerosis. No aneurysm noted in the abdominal vasculature. Other: Trace  amount of pericholecystic fluid. No significant volume of ascites noted in the visualized portions of the peritoneal cavity. Musculoskeletal: No aggressive appearing osseous lesions are noted in the visualized portions of the skeleton. IMPRESSION: 1. Cholelithiasis with evidence of acute cholecystitis. Irregularity of the anteromedial wall of the gallbladder may simply reflect a gallbladder fold, however, the  possibility of pending gallbladder perforation is suspected and surgical evaluation is strongly recommended. 2. Choledocholithiasis. Mild intra and extrahepatic biliary ductal dilatation. 3. Heterogeneous hepatic steatosis. 4. Aortic atherosclerosis. These results will be called to the ordering clinician or representative by the Radiologist Assistant, and communication documented in the PACS or zVision Dashboard. Electronically Signed   By: Trudie Reed M.D.   On: 04/16/2018 08:44   Mr 3d Recon At Scanner  Result Date: 04/16/2018 CLINICAL DATA:  85 year old male with history of abdominal pain, concerning for an acute cholecystitis. Additional history of paroxysmal atrial fibrillation and atrial flutter on Eliquis. Hypertension. COPD. EXAM: MRI ABDOMEN WITHOUT CONTRAST  (INCLUDING MRCP) TECHNIQUE: Multiplanar multisequence MR imaging of the abdomen was performed. Heavily T2-weighted images of the biliary and pancreatic ducts were obtained, and three-dimensional MRCP images were rendered by post processing. COMPARISON:  No priors. FINDINGS: Comment: Today's study is limited for detection and characterization of visceral and/or vascular lesions by lack of IV gadolinium. Lower chest: Unremarkable. Hepatobiliary: Heterogeneous loss of signal intensity throughout the hepatic parenchyma on out of phase dual echo images, indicative of hepatic steatosis. Several small T1 hypointense, T2 hyperintense lesions are noted in the liver, incompletely characterized on today's noncontrast examination, but presumably small cysts and/or biliary hamartomas, largest of which has some thin internal septations and measures up to 12 mm in segment 4A. MRCP images demonstrate very mild intrahepatic biliary ductal dilatation. Common bile duct is also mildly dilated measuring 8 mm in the porta hepatis. In the distal common bile duct there is a 4 mm filling defect (coronal image 25 of series 12), compatible with choledocholithiasis. There  may be another filling defect immediately before the ampulla, however, this is difficult to judge secondary to motion. In addition there is a large signal void in the neck of the gallbladder measuring 2.3 x 1.5 cm, compatible with a gallstone. Some amorphous material lies dependently in the gallbladder and is intermediate intensity on T1 weighted images and slightly low signal intensity on T2 weighted images, compatible with biliary sludge. Gallbladder wall is diffusely thickened and edematous. There is an abnormal appearance of the anteromedial aspect of the wall which is irregular in shape, which may simply reflect a fold, however, the possibility of pending gallbladder perforation in this region is not excluded (best appreciated on axial image 23 of series 10 and 23 of series 7). Increased T2 signal intensity surrounding the gallbladder, indicative of acute inflammation. Pancreas: No pancreatic mass. No pancreatic ductal dilatation. No pancreatic or peripancreatic fluid or inflammatory changes. Spleen:  Unremarkable. Adrenals/Urinary Tract: Small amount of T2 signal intensity surrounding both kidneys, nonspecific. Unenhanced appearance of the kidneys and bilateral adrenal glands is otherwise unremarkable. Stomach/Bowel: Visualized portions are unremarkable. Vascular/Lymphatic: Aortic atherosclerosis. No aneurysm noted in the abdominal vasculature. Other: Trace amount of pericholecystic fluid. No significant volume of ascites noted in the visualized portions of the peritoneal cavity. Musculoskeletal: No aggressive appearing osseous lesions are noted in the visualized portions of the skeleton. IMPRESSION: 1. Cholelithiasis with evidence of acute cholecystitis. Irregularity of the anteromedial wall of the gallbladder may simply reflect a gallbladder fold, however, the possibility of pending gallbladder perforation is  suspected and surgical evaluation is strongly recommended. 2. Choledocholithiasis. Mild intra and  extrahepatic biliary ductal dilatation. 3. Heterogeneous hepatic steatosis. 4. Aortic atherosclerosis. These results will be called to the ordering clinician or representative by the Radiologist Assistant, and communication documented in the PACS or zVision Dashboard. Electronically Signed   By: Trudie Reed M.D.   On: 04/16/2018 08:44      Jerre Simon , [facility] 04/17/2018, 10:10 AM  Pager: [phone] Mon-Wed, Friday 7:00am-4:30pm Thurs 7am-11:30am  Consults: [phone]

## 2018-04-17 NOTE — Progress Notes (Signed)
PROGRESS NOTE    Vincent Barrett  KHV:[id number] DOB: [birth year] DOA: 04/14/2018 PCP: [name], MD   Brief Narrative:  Per admitting physician: [name] a 85 y.o.malewith medical history significant ofhypertension, paroxysmal A. Fib on Eliquis, chronic kidney disease stage III, BPH who is presenting to the emergency room with abdominal pain of 2 days. Patient is hard of hearing. He states that it started hurting about 2 days ago, mostly in the epigastrium and right upper quadrant, no radiation, moderate in intensity, associated with nausea but no vomiting. No fever chills. No change in bowel movements. No similar pain in the past. Patient denies any chest pain. Denies any palpitations, shortness of breath. He denies any anginal pain. ED Course:Afebrile in the ER. Blood pressures are stable. WBC count 13.8. Creatinine is 1.54 which is at about his baseline. LFTs elevated with bilirubin of 3.4. Right upper quadrant ultrasound shows slightly dilated CBD, pericholecystic fluid. Patient received 1 dose of Zosyn in the ER. Patient received IV fluids, antibiotics. Surgery was consulted. Patient will need GI evaluation   Assessment & Plan:   Principal Problem:   Cholelithiasis with choledocholithiasis Active Problems:   Atrial fibrillation (HCC)   Essential hypertension   COPD (chronic obstructive pulmonary disease) (HCC)   On anticoagulant therapy   Abdominal pain   Hypokalemia   CKD (chronic kidney disease) stage 3, GFR 30-59 ml/min (HCC)   Abdominal pain with transaminitis, cholelithiasis, choledocholithiasis.  As previously noted patient presented with an obstructive pattern.  MRCP was concerning for possible ruptured gallbladder and patient is now status post cholecystectomy postop day #1.  Intraoperative cholangiogram was concerning for partial filling defects.  GI is following regarding possible ERCP.  We will continue Zosyn, monitor electrolytes and CBC.  White  blood cell count down to 13 from 22  Paroxysmal A. fib.  Eliquis held preoperatively.  We will continue to hold secondary concerns for possible ERCP by GI.  Continue home medications otherwise.  Hypertension patient's losartan hydrochlorothiazide were held preoperatively.  We will continue beta-blocker and doxazosin.  Will titrate medications back in as blood pressure dictates  CKD stage III patient's near baseline.  Will avoid nephrotoxins continue to monitor electrolytes daily.  Hypokalemia was repleted normal at 3.8.  DVT prophylaxis: SCD/Compression stockings  Code Status: Full code    Code Status Orders  (From admission, onward)         Start     Ordered   04/15/18 0809  Full code  Continuous     04/15/18 0808        Code Status History    This patient has a current code status but no historical code status.     Family Communication: None present Disposition Plan:   Pending GI evaluation and postoperative recovery.  Patient remained inpatient for management of the complex postoperative course requiring IV antibiotics and possible ERCP Consults called: None today Admission status: Inpatient   Consultants:   General surgery, GI, cardiology  Procedures:  Dg Cholangiogram Operative  Result Date: 04/16/2018 CLINICAL DATA:  Intraoperative cholangiogram during laparoscopic cholecystectomy. EXAM: INTRAOPERATIVE CHOLANGIOGRAM FLUOROSCOPY TIME:  20 seconds COMPARISON:  MRCP-04/15/2018 FINDINGS: Intraoperative cholangiographic images of the right upper abdominal quadrant during laparoscopic cholecystectomy are provided for review. Surgical clips overlie the expected location of the gallbladder fossa. Contrast injection demonstrates selective cannulation of the central aspect of the cystic duct. There is passage of contrast through the central aspect of the cystic duct with filling of a mildly dilated common bile  duct. There is passage of contrast though the CBD and into the  descending portion of the duodenum. There is minimal reflux of injected contrast into the common hepatic duct and central aspect of the non dilated intrahepatic biliary system. There is a lenticular shaped nonocclusive filling defect within the distal aspect of the CBD which could be indicative of choledocholithiasis questioned on preceding MRCP. IMPRESSION: Lenticular shaped nonocclusive filling defect within the distal aspect of the CBD, potentially artifactual due to underdistention though could be indicative of nonocclusive choledocholithiasis questioned on preceding MRCP. Further evaluation and management with ERCP could be performed as indicated. Electronically Signed   By: Simonne Come M.D.   On: 04/16/2018 11:51   Ct Abdomen Pelvis W Contrast  Addendum Date: 04/15/2018   ADDENDUM REPORT: 04/15/2018 03:42 ADDENDUM: Additional clinical information, jaundice. The patient's CT images are reviewed. Extrahepatic bile duct is slightly enlarged, measuring up to 9 mm. Questionable density within the distal duct at the head of pancreas. Recommend correlation with LFTs. MRCP could be obtained if concern for ductal obstruction is given history of jaundice Electronically Signed   By: [name] M.D.   On: 04/15/2018 03:42   Result Date: 04/15/2018 CLINICAL DATA:  Abdominal pain with nausea and vomiting EXAM: CT ABDOMEN AND PELVIS WITH CONTRAST TECHNIQUE: Multidetector CT imaging of the abdomen and pelvis was performed using the standard protocol following bolus administration of intravenous contrast. CONTRAST:  80mL OMNIPAQUE IOHEXOL 300 MG/ML  SOLN COMPARISON:  PET CT 01/08/2018, CT 12/30/2017 FINDINGS: Lower chest: Lung bases again demonstrate oval subpleural mass in the left lower lobe, this measures 4.1 x 2.2 cm and appears slightly increased in size. No significant pleural effusion. Small hiatal hernia. Hepatobiliary: Cyst at the dome of the liver. Calcified stone at the neck of the gallbladder. Suspected  edema or fluid adjacent to the gallbladder fundus. No biliary dilatation Pancreas: Unremarkable. No pancreatic ductal dilatation or surrounding inflammatory changes. Spleen: Normal in size without focal abnormality. Adrenals/Urinary Tract: Adrenal glands are unremarkable. Kidneys are normal, without renal calculi, focal lesion, or hydronephrosis. Bladder is unremarkable. Stomach/Bowel: The stomach is nonenlarged. No dilated small bowel. No colon wall thickening. Negative appendix. Colon diverticular disease without acute inflammatory change. Vascular/Lymphatic: Moderate aortic atherosclerosis. No aneurysm. No significantly enlarged lymph nodes. Reproductive: Enlarged heterogeneous prostate with calcification Other: Negative for free air or free fluid Musculoskeletal: Multiple level degenerative changes of the spine. Chronic compression deformity L1. IMPRESSION: 1. 2.3 cm calcified stone at the gallbladder neck. Suspected gallbladder wall edema versus small amount of pericholecystic fluid. Suggest correlation with ultrasound. 2. Slight interval increase in size of subpleural left lower lobe lung mass now measuring 4.1 cm. 3. Diverticular disease of the colon without acute inflammatory change Electronically Signed: By: [name] M.D. On: 04/15/2018 02:38   Mr Abdomen Mrcp Wo Contrast  Result Date: 04/16/2018 CLINICAL DATA:  85 year old male with history of abdominal pain, concerning for an acute cholecystitis. Additional history of paroxysmal atrial fibrillation and atrial flutter on Eliquis. Hypertension. COPD. EXAM: MRI ABDOMEN WITHOUT CONTRAST  (INCLUDING MRCP) TECHNIQUE: Multiplanar multisequence MR imaging of the abdomen was performed. Heavily T2-weighted images of the biliary and pancreatic ducts were obtained, and three-dimensional MRCP images were rendered by post processing. COMPARISON:  No priors. FINDINGS: Comment: Today's study is limited for detection and characterization of visceral and/or  vascular lesions by lack of IV gadolinium. Lower chest: Unremarkable. Hepatobiliary: Heterogeneous loss of signal intensity throughout the hepatic parenchyma on out of phase dual echo images,  indicative of hepatic steatosis. Several small T1 hypointense, T2 hyperintense lesions are noted in the liver, incompletely characterized on today's noncontrast examination, but presumably small cysts and/or biliary hamartomas, largest of which has some thin internal septations and measures up to 12 mm in segment 4A. MRCP images demonstrate very mild intrahepatic biliary ductal dilatation. Common bile duct is also mildly dilated measuring 8 mm in the porta hepatis. In the distal common bile duct there is a 4 mm filling defect (coronal image 25 of series 12), compatible with choledocholithiasis. There may be another filling defect immediately before the ampulla, however, this is difficult to judge secondary to motion. In addition there is a large signal void in the neck of the gallbladder measuring 2.3 x 1.5 cm, compatible with a gallstone. Some amorphous material lies dependently in the gallbladder and is intermediate intensity on T1 weighted images and slightly low signal intensity on T2 weighted images, compatible with biliary sludge. Gallbladder wall is diffusely thickened and edematous. There is an abnormal appearance of the anteromedial aspect of the wall which is irregular in shape, which may simply reflect a fold, however, the possibility of pending gallbladder perforation in this region is not excluded (best appreciated on axial image 23 of series 10 and 23 of series 7). Increased T2 signal intensity surrounding the gallbladder, indicative of acute inflammation. Pancreas: No pancreatic mass. No pancreatic ductal dilatation. No pancreatic or peripancreatic fluid or inflammatory changes. Spleen:  Unremarkable. Adrenals/Urinary Tract: Small amount of T2 signal intensity surrounding both kidneys, nonspecific. Unenhanced  appearance of the kidneys and bilateral adrenal glands is otherwise unremarkable. Stomach/Bowel: Visualized portions are unremarkable. Vascular/Lymphatic: Aortic atherosclerosis. No aneurysm noted in the abdominal vasculature. Other: Trace amount of pericholecystic fluid. No significant volume of ascites noted in the visualized portions of the peritoneal cavity. Musculoskeletal: No aggressive appearing osseous lesions are noted in the visualized portions of the skeleton. IMPRESSION: 1. Cholelithiasis with evidence of acute cholecystitis. Irregularity of the anteromedial wall of the gallbladder may simply reflect a gallbladder fold, however, the possibility of pending gallbladder perforation is suspected and surgical evaluation is strongly recommended. 2. Choledocholithiasis. Mild intra and extrahepatic biliary ductal dilatation. 3. Heterogeneous hepatic steatosis. 4. Aortic atherosclerosis. These results will be called to the ordering clinician or representative by the Radiologist Assistant, and communication documented in the PACS or zVision Dashboard. Electronically Signed   By: Trudie Reed M.D.   On: 04/16/2018 08:44   Mr 3d Recon At Scanner  Result Date: 04/16/2018 CLINICAL DATA:  85 year old male with history of abdominal pain, concerning for an acute cholecystitis. Additional history of paroxysmal atrial fibrillation and atrial flutter on Eliquis. Hypertension. COPD. EXAM: MRI ABDOMEN WITHOUT CONTRAST  (INCLUDING MRCP) TECHNIQUE: Multiplanar multisequence MR imaging of the abdomen was performed. Heavily T2-weighted images of the biliary and pancreatic ducts were obtained, and three-dimensional MRCP images were rendered by post processing. COMPARISON:  No priors. FINDINGS: Comment: Today's study is limited for detection and characterization of visceral and/or vascular lesions by lack of IV gadolinium. Lower chest: Unremarkable. Hepatobiliary: Heterogeneous loss of signal intensity throughout the hepatic  parenchyma on out of phase dual echo images, indicative of hepatic steatosis. Several small T1 hypointense, T2 hyperintense lesions are noted in the liver, incompletely characterized on today's noncontrast examination, but presumably small cysts and/or biliary hamartomas, largest of which has some thin internal septations and measures up to 12 mm in segment 4A. MRCP images demonstrate very mild intrahepatic biliary ductal dilatation. Common bile duct is also mildly dilated measuring  8 mm in the porta hepatis. In the distal common bile duct there is a 4 mm filling defect (coronal image 25 of series 12), compatible with choledocholithiasis. There may be another filling defect immediately before the ampulla, however, this is difficult to judge secondary to motion. In addition there is a large signal void in the neck of the gallbladder measuring 2.3 x 1.5 cm, compatible with a gallstone. Some amorphous material lies dependently in the gallbladder and is intermediate intensity on T1 weighted images and slightly low signal intensity on T2 weighted images, compatible with biliary sludge. Gallbladder wall is diffusely thickened and edematous. There is an abnormal appearance of the anteromedial aspect of the wall which is irregular in shape, which may simply reflect a fold, however, the possibility of pending gallbladder perforation in this region is not excluded (best appreciated on axial image 23 of series 10 and 23 of series 7). Increased T2 signal intensity surrounding the gallbladder, indicative of acute inflammation. Pancreas: No pancreatic mass. No pancreatic ductal dilatation. No pancreatic or peripancreatic fluid or inflammatory changes. Spleen:  Unremarkable. Adrenals/Urinary Tract: Small amount of T2 signal intensity surrounding both kidneys, nonspecific. Unenhanced appearance of the kidneys and bilateral adrenal glands is otherwise unremarkable. Stomach/Bowel: Visualized portions are unremarkable.  Vascular/Lymphatic: Aortic atherosclerosis. No aneurysm noted in the abdominal vasculature. Other: Trace amount of pericholecystic fluid. No significant volume of ascites noted in the visualized portions of the peritoneal cavity. Musculoskeletal: No aggressive appearing osseous lesions are noted in the visualized portions of the skeleton. IMPRESSION: 1. Cholelithiasis with evidence of acute cholecystitis. Irregularity of the anteromedial wall of the gallbladder may simply reflect a gallbladder fold, however, the possibility of pending gallbladder perforation is suspected and surgical evaluation is strongly recommended. 2. Choledocholithiasis. Mild intra and extrahepatic biliary ductal dilatation. 3. Heterogeneous hepatic steatosis. 4. Aortic atherosclerosis. These results will be called to the ordering clinician or representative by the Radiologist Assistant, and communication documented in the PACS or zVision Dashboard. Electronically Signed   By: Trudie Reed M.D.   On: 04/16/2018 08:44   US Abdomen Limited  Result Date: 04/15/2018 CLINICAL DATA:  Jaundice EXAM: ULTRASOUND ABDOMEN LIMITED RIGHT UPPER QUADRANT COMPARISON:  CT 04/15/2018 FINDINGS: Gallbladder: Shadowing stone within the gallbladder neck measuring 1.9 cm. Increased wall thickness of 8.8 mm with trace pericholecystic fluid. Negative sonographic Murphy. Small amount of intraluminal sludge. Common bile duct: Diameter: 4.9 mm Liver: Heterogeneous echotexture without focal hepatic abnormality. Portal vein is patent on color Doppler imaging with normal direction of blood flow towards the liver. IMPRESSION: 1. Gallstone with increased wall thickness and small amount of pericholecystic fluid, raising concern for cholecystitis despite negative sonographic Murphy. Electronically Signed   By: [name] M.D.   On: 04/15/2018 03:39     Antimicrobials:   Zosyn day #3   Subjective: Patient reports improved from prior eval yesterday.  Decreased  pain,  Objective: Vitals:   04/16/18 2258 04/17/18 0242 04/17/18 0545 04/17/18 0933  BP: (!) 116/59 110/60 132/65 140/64  Pulse: 89 91 90 91  Resp:  18 (!) 21 18  Temp:  99.2 F (37.3 C) 98.5 F (36.9 C) 98.1 F (36.7 C)  TempSrc:  Oral Oral Oral  SpO2:  97% 96% 96%  Weight:      Height:        Intake/Output Summary (Last 24 hours) at 04/17/2018 1108 Last data filed at 04/17/2018 0500 Gross per 24 hour  Intake 2022.56 ml  Output 275 ml  Net 1747.56 ml  Filed Weights   04/16/18 0955  Weight: 72.6 kg    Examination:  General exam: Appears calm and comfortable  Respiratory system: Clear to auscultation. Respiratory effort normal. Cardiovascular system: S1 & S2 heard, RRR. No JVD, murmurs, rubs, gallops or clicks. No pedal edema. Gastrointestinal system: Abdomen is nondistended, soft and mildly tender without focal finding. No organomegaly or masses felt. Normal bowel sounds heard. Central nervous system: Alert and oriented. No focal neurological deficits. Extremities: Symmetric 5 x 5 power.  Moves all 4 extremities freely Skin: No rashes, lesions or ulcers Psychiatry: Judgement and insight appear normal.  Although question mild cognitive impairment, mood & affect appropriate.     Data Reviewed: I have personally reviewed following labs and imaging studies  CBC: Recent Labs  Lab 04/14/18 1919 04/16/18 0203 04/17/18 0326  WBC 13.8* 21.8* 13.2*  HGB 13.7 11.9* 10.0*  HCT 39.8 34.3* 30.1*  MCV 89.6 89.1 91.8  PLT 225 189 156   Basic Metabolic Panel: Recent Labs  Lab 04/14/18 1919 04/16/18 0203 04/17/18 0326  NA 134* 135 136  K 3.2* 3.2* 3.8  CL 99 105 107  CO2 21* 21* 22  GLUCOSE 168* 135* 155*  BUN 21 19 25*  CREATININE 1.54* 1.52* 1.59*  CALCIUM 9.3 7.8* 7.5*  MG  --  1.6*  --    GFR: Estimated Creatinine Clearance: 28.2 mL/min (A) (by C-G formula based on SCr of 1.59 mg/dL (H)). Liver Function Tests: Recent Labs  Lab 04/14/18 1919 04/16/18 0203  04/17/18 0326  AST 284* 66* 75*  ALT 347* 155* 124*  ALKPHOS 158* 96 89  BILITOT 3.4* 2.0* 1.9*  PROT 7.7 6.1* 5.4*  ALBUMIN 3.9 2.9* 2.2*   Recent Labs  Lab 04/14/18 1919 04/16/18 0203  LIPASE 23 18   No results for input(s): AMMONIA in the last 168 hours. Coagulation Profile: No results for input(s): INR, PROTIME in the last 168 hours. Cardiac Enzymes: No results for input(s): CKTOTAL, CKMB, CKMBINDEX, TROPONINI in the last 168 hours. BNP (last 3 results) No results for input(s): PROBNP in the last 8760 hours. HbA1C: No results for input(s): HGBA1C in the last 72 hours. CBG: No results for input(s): GLUCAP in the last 168 hours. Lipid Profile: No results for input(s): CHOL, HDL, LDLCALC, TRIG, CHOLHDL, LDLDIRECT in the last 72 hours. Thyroid Function Tests: No results for input(s): TSH, T4TOTAL, FREET4, T3FREE, THYROIDAB in the last 72 hours. Anemia Panel: No results for input(s): VITAMINB12, FOLATE, FERRITIN, TIBC, IRON, RETICCTPCT in the last 72 hours. Sepsis Labs: No results for input(s): PROCALCITON, LATICACIDVEN in the last 168 hours.  Recent Results (from the past 240 hour(s))  Culture, Urine     Status: None   Collection Time: 04/16/18  8:50 AM  Result Value Ref Range Status   Specimen Description URINE, CLEAN CATCH  Final   Special Requests NONE  Final   Culture   Final    NO GROWTH Performed at [facility], [address] [place], [place] [id number]    Report Status 04/17/2018 FINAL  Final  Surgical pcr screen     Status: None   Collection Time: 04/16/18  9:35 AM  Result Value Ref Range Status   MRSA, PCR NEGATIVE NEGATIVE Final   Staphylococcus aureus NEGATIVE NEGATIVE Final    Comment: (NOTE) The Xpert SA Assay (FDA approved for NASAL specimens in patients 83 years of age and older), is one component of a comprehensive surveillance program. It is not intended to diagnose infection  nor to guide or monitor treatment. Performed at [facility], [address], [place], [place] [address]          Radiology Studies: Dg Cholangiogram Operative  Result Date: 04/16/2018 CLINICAL DATA:  Intraoperative cholangiogram during laparoscopic cholecystectomy. EXAM: INTRAOPERATIVE CHOLANGIOGRAM FLUOROSCOPY TIME:  20 seconds COMPARISON:  MRCP-04/15/2018 FINDINGS: Intraoperative cholangiographic images of the right upper abdominal quadrant during laparoscopic cholecystectomy are provided for review. Surgical clips overlie the expected location of the gallbladder fossa. Contrast injection demonstrates selective cannulation of the central aspect of the cystic duct. There is passage of contrast through the central aspect of the cystic duct with filling of a mildly dilated common bile duct. There is passage of contrast though the CBD and into the descending portion of the duodenum. There is minimal reflux of injected contrast into the common hepatic duct and central aspect of the non dilated intrahepatic biliary system. There is a lenticular shaped nonocclusive filling defect within the distal aspect of the CBD which could be indicative of choledocholithiasis questioned on preceding MRCP. IMPRESSION: Lenticular shaped nonocclusive filling defect within the distal aspect of the CBD, potentially artifactual due to underdistention though could be indicative of nonocclusive choledocholithiasis questioned on preceding MRCP. Further evaluation and management with ERCP could be performed as indicated. Electronically Signed   By: Simonne Come M.D.   On: 04/16/2018 11:51   Mr Abdomen Mrcp Wo Contrast  Result Date: 04/16/2018 CLINICAL DATA:  85 year old male with history of abdominal pain, concerning for an acute cholecystitis. Additional history of paroxysmal atrial fibrillation and atrial flutter on Eliquis. Hypertension. COPD. EXAM: MRI ABDOMEN WITHOUT CONTRAST  (INCLUDING MRCP) TECHNIQUE: Multiplanar multisequence MR imaging of the abdomen was performed. Heavily  T2-weighted images of the biliary and pancreatic ducts were obtained, and three-dimensional MRCP images were rendered by post processing. COMPARISON:  No priors. FINDINGS: Comment: Today's study is limited for detection and characterization of visceral and/or vascular lesions by lack of IV gadolinium. Lower chest: Unremarkable. Hepatobiliary: Heterogeneous loss of signal intensity throughout the hepatic parenchyma on out of phase dual echo images, indicative of hepatic steatosis. Several small T1 hypointense, T2 hyperintense lesions are noted in the liver, incompletely characterized on today's noncontrast examination, but presumably small cysts and/or biliary hamartomas, largest of which has some thin internal septations and measures up to 12 mm in segment 4A. MRCP images demonstrate very mild intrahepatic biliary ductal dilatation. Common bile duct is also mildly dilated measuring 8 mm in the porta hepatis. In the distal common bile duct there is a 4 mm filling defect (coronal image 25 of series 12), compatible with choledocholithiasis. There may be another filling defect immediately before the ampulla, however, this is difficult to judge secondary to motion. In addition there is a large signal void in the neck of the gallbladder measuring 2.3 x 1.5 cm, compatible with a gallstone. Some amorphous material lies dependently in the gallbladder and is intermediate intensity on T1 weighted images and slightly low signal intensity on T2 weighted images, compatible with biliary sludge. Gallbladder wall is diffusely thickened and edematous. There is an abnormal appearance of the anteromedial aspect of the wall which is irregular in shape, which may simply reflect a fold, however, the possibility of pending gallbladder perforation in this region is not excluded (best appreciated on axial image 23 of series 10 and 23 of series 7). Increased T2 signal intensity surrounding the gallbladder, indicative of acute inflammation.  Pancreas: No pancreatic mass. No pancreatic ductal dilatation. No pancreatic or  peripancreatic fluid or inflammatory changes. Spleen:  Unremarkable. Adrenals/Urinary Tract: Small amount of T2 signal intensity surrounding both kidneys, nonspecific. Unenhanced appearance of the kidneys and bilateral adrenal glands is otherwise unremarkable. Stomach/Bowel: Visualized portions are unremarkable. Vascular/Lymphatic: Aortic atherosclerosis. No aneurysm noted in the abdominal vasculature. Other: Trace amount of pericholecystic fluid. No significant volume of ascites noted in the visualized portions of the peritoneal cavity. Musculoskeletal: No aggressive appearing osseous lesions are noted in the visualized portions of the skeleton. IMPRESSION: 1. Cholelithiasis with evidence of acute cholecystitis. Irregularity of the anteromedial wall of the gallbladder may simply reflect a gallbladder fold, however, the possibility of pending gallbladder perforation is suspected and surgical evaluation is strongly recommended. 2. Choledocholithiasis. Mild intra and extrahepatic biliary ductal dilatation. 3. Heterogeneous hepatic steatosis. 4. Aortic atherosclerosis. These results will be called to the ordering clinician or representative by the Radiologist Assistant, and communication documented in the PACS or zVision Dashboard. Electronically Signed   By: Trudie Reed M.D.   On: 04/16/2018 08:44   Mr 3d Recon At Scanner  Result Date: 04/16/2018 CLINICAL DATA:  85 year old male with history of abdominal pain, concerning for an acute cholecystitis. Additional history of paroxysmal atrial fibrillation and atrial flutter on Eliquis. Hypertension. COPD. EXAM: MRI ABDOMEN WITHOUT CONTRAST  (INCLUDING MRCP) TECHNIQUE: Multiplanar multisequence MR imaging of the abdomen was performed. Heavily T2-weighted images of the biliary and pancreatic ducts were obtained, and three-dimensional MRCP images were rendered by post processing.  COMPARISON:  No priors. FINDINGS: Comment: Today's study is limited for detection and characterization of visceral and/or vascular lesions by lack of IV gadolinium. Lower chest: Unremarkable. Hepatobiliary: Heterogeneous loss of signal intensity throughout the hepatic parenchyma on out of phase dual echo images, indicative of hepatic steatosis. Several small T1 hypointense, T2 hyperintense lesions are noted in the liver, incompletely characterized on today's noncontrast examination, but presumably small cysts and/or biliary hamartomas, largest of which has some thin internal septations and measures up to 12 mm in segment 4A. MRCP images demonstrate very mild intrahepatic biliary ductal dilatation. Common bile duct is also mildly dilated measuring 8 mm in the porta hepatis. In the distal common bile duct there is a 4 mm filling defect (coronal image 25 of series 12), compatible with choledocholithiasis. There may be another filling defect immediately before the ampulla, however, this is difficult to judge secondary to motion. In addition there is a large signal void in the neck of the gallbladder measuring 2.3 x 1.5 cm, compatible with a gallstone. Some amorphous material lies dependently in the gallbladder and is intermediate intensity on T1 weighted images and slightly low signal intensity on T2 weighted images, compatible with biliary sludge. Gallbladder wall is diffusely thickened and edematous. There is an abnormal appearance of the anteromedial aspect of the wall which is irregular in shape, which may simply reflect a fold, however, the possibility of pending gallbladder perforation in this region is not excluded (best appreciated on axial image 23 of series 10 and 23 of series 7). Increased T2 signal intensity surrounding the gallbladder, indicative of acute inflammation. Pancreas: No pancreatic mass. No pancreatic ductal dilatation. No pancreatic or peripancreatic fluid or inflammatory changes. Spleen:   Unremarkable. Adrenals/Urinary Tract: Small amount of T2 signal intensity surrounding both kidneys, nonspecific. Unenhanced appearance of the kidneys and bilateral adrenal glands is otherwise unremarkable. Stomach/Bowel: Visualized portions are unremarkable. Vascular/Lymphatic: Aortic atherosclerosis. No aneurysm noted in the abdominal vasculature. Other: Trace amount of pericholecystic fluid. No significant volume of ascites noted in the visualized portions  of the peritoneal cavity. Musculoskeletal: No aggressive appearing osseous lesions are noted in the visualized portions of the skeleton. IMPRESSION: 1. Cholelithiasis with evidence of acute cholecystitis. Irregularity of the anteromedial wall of the gallbladder may simply reflect a gallbladder fold, however, the possibility of pending gallbladder perforation is suspected and surgical evaluation is strongly recommended. 2. Choledocholithiasis. Mild intra and extrahepatic biliary ductal dilatation. 3. Heterogeneous hepatic steatosis. 4. Aortic atherosclerosis. These results will be called to the ordering clinician or representative by the Radiologist Assistant, and communication documented in the PACS or zVision Dashboard. Electronically Signed   By: Trudie Reed M.D.   On: 04/16/2018 08:44        Scheduled Meds: . amLODipine  2.5 mg Oral Daily  . doxazosin  4 mg Oral Daily  . metoprolol succinate  25 mg Oral Daily  . multivitamin  1 tablet Oral Daily  . sodium chloride flush  3 mL Intravenous Once  . vitamin C  1,000 mg Oral Daily   Continuous Infusions: . dextrose 5 % and 0.9 % NaCl with KCl 40 mEq/L 75 mL/hr at 04/17/18 0551  . lactated ringers Stopped (04/16/18 1211)  . magnesium sulfate 1 - 4 g bolus IVPB    . piperacillin-tazobactam (ZOSYN)  IV 3.375 g (04/17/18 0531)     LOS: 2 days    Time spent: 35 min    Burke Keels, MD [facility]  If 7PM-7AM, please contact night-coverage  04/17/2018, 11:08 AM

## 2018-04-17 NOTE — Progress Notes (Addendum)
When I came by his room, patient was out in the hall for a walk, with a nurse, using a walker.  He says he feels poorly, but does not look like he is in any distress.  He indicates he does not necessarily feel better than yesterday.  However, his white count is markedly improved from yesterday, liver chemistries are about the same.  He indicates he has some low abdominal discomfort, and there is tympany in that area, suggesting perhaps an element of localized gas trapping in the colon.  No overt abdominal tenderness.  The patient is on a clear liquid diet and does not feel ready to advance it.  Impression: The patient has not had the prompt drop in liver chemistries which I had hoped for following removal of his gallbladder, thereby suggesting that he may well have some ongoing biliary obstruction from stones or sludge, as were suggested on his intraoperative cholangiogram.  Plan:  1.  Follow liver chemistries  2.  KUB in a.m. to assess for gas retention or ileus  3.  Consider ERCP Monday or Tuesday if the patient does not show improvement clinically and/or biochemically in the meantime.  No urgent indication for ERCP at this time.  Florencia Reasons, M.D. Pager [phone] If no answer or after 5 PM call [phone]

## 2018-04-18 ENCOUNTER — Inpatient Hospital Stay (HOSPITAL_COMMUNITY): Payer: Medicare Other

## 2018-04-18 LAB — COMPREHENSIVE METABOLIC PANEL
ALT: 109 U/L — ABNORMAL HIGH (ref 0–44)
AST: 61 U/L — ABNORMAL HIGH (ref 15–41)
Albumin: 2.4 g/dL — ABNORMAL LOW (ref 3.5–5.0)
Alkaline Phosphatase: 153 U/L — ABNORMAL HIGH (ref 38–126)
Anion gap: 6 (ref 5–15)
BUN: 24 mg/dL — ABNORMAL HIGH (ref 8–23)
CO2: 20 mmol/L — ABNORMAL LOW (ref 22–32)
CREATININE: 1.57 mg/dL — AB (ref 0.61–1.24)
Calcium: 7.9 mg/dL — ABNORMAL LOW (ref 8.9–10.3)
Chloride: 113 mmol/L — ABNORMAL HIGH (ref 98–111)
GFR calc non Af Amer: 38 mL/min — ABNORMAL LOW (ref >60)
GFR, EST AFRICAN AMERICAN: 44 mL/min — AB (ref >60)
Glucose, Bld: 129 mg/dL — ABNORMAL HIGH (ref 70–99)
Potassium: 4.3 mmol/L (ref 3.5–5.1)
Sodium: 139 mmol/L (ref 135–145)
Total Bilirubin: 1.6 mg/dL — ABNORMAL HIGH (ref 0.3–1.2)
Total Protein: 5.7 g/dL — ABNORMAL LOW (ref 6.5–8.1)

## 2018-04-18 LAB — CBC
HCT: 30.5 % — ABNORMAL LOW (ref 39.0–52.0)
Hemoglobin: 10.2 g/dL — ABNORMAL LOW (ref 13.0–17.0)
MCH: 31.3 pg (ref 26.0–34.0)
MCHC: 33.4 g/dL (ref 30.0–36.0)
MCV: 93.6 fL (ref 80.0–100.0)
Platelets: 176 10*3/uL (ref 150–400)
RBC: 3.26 MIL/uL — ABNORMAL LOW (ref 4.22–5.81)
RDW: 13.1 % (ref 11.5–15.5)
WBC: 10.4 10*3/uL (ref 4.0–10.5)
nRBC: 0 % (ref 0.0–0.2)

## 2018-04-18 LAB — MAGNESIUM: Magnesium: 2 mg/dL (ref 1.7–2.4)

## 2018-04-18 NOTE — Progress Notes (Signed)
Patient ambulated 200 feet in hallway with stand by assistance. Patient returned to chair and educated that he needs to ambulate 3 more times today. Daughter at bedside and will attempt to convince patient to increase ambulation.   Lyndal Pulley, RN 04/18/2018 1:34 PM

## 2018-04-18 NOTE — Progress Notes (Signed)
PROGRESS NOTE    Vincent Barrett  PPI:[id number] DOB: 1925/11/28 DOA: 04/14/2018 PCP: [name], MD   Brief Narrative:  Per admitting physician: [name] a 85 y.o.malewith medical history significant ofhypertension, paroxysmal A. Fib on Eliquis, chronic kidney disease stage III, BPH who is presenting to the emergency room with abdominal pain of 2 days. Patient is hard of hearing. He states that it started hurting about 2 days ago, mostly in the epigastrium and right upper quadrant, no radiation, moderate in intensity, associated with nausea but no vomiting. No fever chills. No change in bowel movements. No similar pain in the past. Patient denies any chest pain. Denies any palpitations, shortness of breath. He denies any anginal pain. ED Course:Afebrile in the ER. Blood pressures are stable. WBC count 13.8. Creatinine is 1.54 which is at about his baseline. LFTs elevated with bilirubin of 3.4. Right upper quadrant ultrasound shows slightly dilated CBD, pericholecystic fluid. Patient received 1 dose of Zosyn in the ER. Patient received IV fluids, antibiotics. Surgery was consulted. Patient will need GI evaluation   Assessment & Plan:   Principal Problem:   Cholelithiasis with choledocholithiasis Active Problems:   Atrial fibrillation (HCC)   Essential hypertension   COPD (chronic obstructive pulmonary disease) (HCC)   On anticoagulant therapy   Abdominal pain   Hypokalemia   CKD (chronic kidney disease) stage 3, GFR 30-59 ml/min (HCC)   Abdominal pain with transaminitis, cholelithiasis, choledocholithiasis adynamic ileus.  As previously noted patient presented with an obstructive pattern.  MRCP was concerning for possible ruptured gallbladder and patient is now status post cholecystectomy postop day #2.  Intraoperative cholangiogram was concerning for partial filling defects.  GI is following regarding possible ERCP vs EUS.  We will continue Zosyn, monitor  electrolytes and CBC.  White blood cell count down to 10.4 from 22.  Discussed case with GI.  As LFTs are trending down, will continue to monitor, consider EUS.  Patient is pain-free and tolerating diet can follow-up outpatient.  Paroxysmal A. fib.  Eliquis held preoperatively.  We will continue to hold secondary concerns for possible ERCP by GI.  Will discuss with them tomorrow if LFTs continue to trend down reinitiate Eliquis. Continue home medications otherwise.  Hypertension patient's losartan hydrochlorothiazide were held preoperatively.  We will continue beta-blocker and doxazosin.  Will titrate medications back in as blood pressure dictates-HAS BEEN VARIABLE TO [id number] SBP  CKD stage III patient's near baseline.  Will avoid nephrotoxins continue to monitor electrolytes daily.  Hypokalemia was repleted normal at 3.8.  DVT prophylaxis: SCD/Compression stockings  Code Status: FULL    Code Status Orders  (From admission, onward)         Start     Ordered   04/15/18 0809  Full code  Continuous     04/15/18 0808        Code Status History    This patient has a current code status but no historical code status.     Family Communication: NONE PRESENT-AWAITING WIFE  Disposition Plan:   Patient remained inpatient for postoperative recovery, slowly advancing diet as tolerates.  Possible ERCP because of transaminitis and filling defects on intraoperative cholangiogram.  Patient will continue with IV antibiotics. Consults called: None Admission status: Inpatient   Consultants:   General surgery, GI, cardiology  Procedures:  Dg Cholangiogram Operative  Result Date: 04/16/2018 CLINICAL DATA:  Intraoperative cholangiogram during laparoscopic cholecystectomy. EXAM: INTRAOPERATIVE CHOLANGIOGRAM FLUOROSCOPY TIME:  20 seconds COMPARISON:  MRCP-04/15/2018 FINDINGS: Intraoperative cholangiographic images of  the right upper abdominal quadrant during laparoscopic cholecystectomy are  provided for review. Surgical clips overlie the expected location of the gallbladder fossa. Contrast injection demonstrates selective cannulation of the central aspect of the cystic duct. There is passage of contrast through the central aspect of the cystic duct with filling of a mildly dilated common bile duct. There is passage of contrast though the CBD and into the descending portion of the duodenum. There is minimal reflux of injected contrast into the common hepatic duct and central aspect of the non dilated intrahepatic biliary system. There is a lenticular shaped nonocclusive filling defect within the distal aspect of the CBD which could be indicative of choledocholithiasis questioned on preceding MRCP. IMPRESSION: Lenticular shaped nonocclusive filling defect within the distal aspect of the CBD, potentially artifactual due to underdistention though could be indicative of nonocclusive choledocholithiasis questioned on preceding MRCP. Further evaluation and management with ERCP could be performed as indicated. Electronically Signed   By: Simonne Come M.D.   On: 04/16/2018 11:51   Ct Abdomen Pelvis W Contrast  Addendum Date: 04/15/2018   ADDENDUM REPORT: 04/15/2018 03:42 ADDENDUM: Additional clinical information, jaundice. The patient's CT images are reviewed. Extrahepatic bile duct is slightly enlarged, measuring up to 9 mm. Questionable density within the distal duct at the head of pancreas. Recommend correlation with LFTs. MRCP could be obtained if concern for ductal obstruction is given history of jaundice Electronically Signed   By: [name] M.D.   On: 04/15/2018 03:42   Result Date: 04/15/2018 CLINICAL DATA:  Abdominal pain with nausea and vomiting EXAM: CT ABDOMEN AND PELVIS WITH CONTRAST TECHNIQUE: Multidetector CT imaging of the abdomen and pelvis was performed using the standard protocol following bolus administration of intravenous contrast. CONTRAST:  80mL OMNIPAQUE IOHEXOL 300 MG/ML  SOLN  COMPARISON:  PET CT 01/08/2018, CT 12/30/2017 FINDINGS: Lower chest: Lung bases again demonstrate oval subpleural mass in the left lower lobe, this measures 4.1 x 2.2 cm and appears slightly increased in size. No significant pleural effusion. Small hiatal hernia. Hepatobiliary: Cyst at the dome of the liver. Calcified stone at the neck of the gallbladder. Suspected edema or fluid adjacent to the gallbladder fundus. No biliary dilatation Pancreas: Unremarkable. No pancreatic ductal dilatation or surrounding inflammatory changes. Spleen: Normal in size without focal abnormality. Adrenals/Urinary Tract: Adrenal glands are unremarkable. Kidneys are normal, without renal calculi, focal lesion, or hydronephrosis. Bladder is unremarkable. Stomach/Bowel: The stomach is nonenlarged. No dilated small bowel. No colon wall thickening. Negative appendix. Colon diverticular disease without acute inflammatory change. Vascular/Lymphatic: Moderate aortic atherosclerosis. No aneurysm. No significantly enlarged lymph nodes. Reproductive: Enlarged heterogeneous prostate with calcification Other: Negative for free air or free fluid Musculoskeletal: Multiple level degenerative changes of the spine. Chronic compression deformity L1. IMPRESSION: 1. 2.3 cm calcified stone at the gallbladder neck. Suspected gallbladder wall edema versus small amount of pericholecystic fluid. Suggest correlation with ultrasound. 2. Slight interval increase in size of subpleural left lower lobe lung mass now measuring 4.1 cm. 3. Diverticular disease of the colon without acute inflammatory change Electronically Signed: By: [name] M.D. On: 04/15/2018 02:38   Mr Abdomen Mrcp Wo Contrast  Result Date: 04/16/2018 CLINICAL DATA:  85 year old male with history of abdominal pain, concerning for an acute cholecystitis. Additional history of paroxysmal atrial fibrillation and atrial flutter on Eliquis. Hypertension. COPD. EXAM: MRI ABDOMEN WITHOUT CONTRAST   (INCLUDING MRCP) TECHNIQUE: Multiplanar multisequence MR imaging of the abdomen was performed. Heavily T2-weighted images of the biliary and pancreatic  ducts were obtained, and three-dimensional MRCP images were rendered by post processing. COMPARISON:  No priors. FINDINGS: Comment: Today's study is limited for detection and characterization of visceral and/or vascular lesions by lack of IV gadolinium. Lower chest: Unremarkable. Hepatobiliary: Heterogeneous loss of signal intensity throughout the hepatic parenchyma on out of phase dual echo images, indicative of hepatic steatosis. Several small T1 hypointense, T2 hyperintense lesions are noted in the liver, incompletely characterized on today's noncontrast examination, but presumably small cysts and/or biliary hamartomas, largest of which has some thin internal septations and measures up to 12 mm in segment 4A. MRCP images demonstrate very mild intrahepatic biliary ductal dilatation. Common bile duct is also mildly dilated measuring 8 mm in the porta hepatis. In the distal common bile duct there is a 4 mm filling defect (coronal image 25 of series 12), compatible with choledocholithiasis. There may be another filling defect immediately before the ampulla, however, this is difficult to judge secondary to motion. In addition there is a large signal void in the neck of the gallbladder measuring 2.3 x 1.5 cm, compatible with a gallstone. Some amorphous material lies dependently in the gallbladder and is intermediate intensity on T1 weighted images and slightly low signal intensity on T2 weighted images, compatible with biliary sludge. Gallbladder wall is diffusely thickened and edematous. There is an abnormal appearance of the anteromedial aspect of the wall which is irregular in shape, which may simply reflect a fold, however, the possibility of pending gallbladder perforation in this region is not excluded (best appreciated on axial image 23 of series 10 and 23 of  series 7). Increased T2 signal intensity surrounding the gallbladder, indicative of acute inflammation. Pancreas: No pancreatic mass. No pancreatic ductal dilatation. No pancreatic or peripancreatic fluid or inflammatory changes. Spleen:  Unremarkable. Adrenals/Urinary Tract: Small amount of T2 signal intensity surrounding both kidneys, nonspecific. Unenhanced appearance of the kidneys and bilateral adrenal glands is otherwise unremarkable. Stomach/Bowel: Visualized portions are unremarkable. Vascular/Lymphatic: Aortic atherosclerosis. No aneurysm noted in the abdominal vasculature. Other: Trace amount of pericholecystic fluid. No significant volume of ascites noted in the visualized portions of the peritoneal cavity. Musculoskeletal: No aggressive appearing osseous lesions are noted in the visualized portions of the skeleton. IMPRESSION: 1. Cholelithiasis with evidence of acute cholecystitis. Irregularity of the anteromedial wall of the gallbladder may simply reflect a gallbladder fold, however, the possibility of pending gallbladder perforation is suspected and surgical evaluation is strongly recommended. 2. Choledocholithiasis. Mild intra and extrahepatic biliary ductal dilatation. 3. Heterogeneous hepatic steatosis. 4. Aortic atherosclerosis. These results will be called to the ordering clinician or representative by the Radiologist Assistant, and communication documented in the PACS or zVision Dashboard. Electronically Signed   By: Trudie Reed M.D.   On: 04/16/2018 08:44   Mr 3d Recon At Scanner  Result Date: 04/16/2018 CLINICAL DATA:  85 year old male with history of abdominal pain, concerning for an acute cholecystitis. Additional history of paroxysmal atrial fibrillation and atrial flutter on Eliquis. Hypertension. COPD. EXAM: MRI ABDOMEN WITHOUT CONTRAST  (INCLUDING MRCP) TECHNIQUE: Multiplanar multisequence MR imaging of the abdomen was performed. Heavily T2-weighted images of the biliary and  pancreatic ducts were obtained, and three-dimensional MRCP images were rendered by post processing. COMPARISON:  No priors. FINDINGS: Comment: Today's study is limited for detection and characterization of visceral and/or vascular lesions by lack of IV gadolinium. Lower chest: Unremarkable. Hepatobiliary: Heterogeneous loss of signal intensity throughout the hepatic parenchyma on out of phase dual echo images, indicative of hepatic steatosis. Several small T1  hypointense, T2 hyperintense lesions are noted in the liver, incompletely characterized on today's noncontrast examination, but presumably small cysts and/or biliary hamartomas, largest of which has some thin internal septations and measures up to 12 mm in segment 4A. MRCP images demonstrate very mild intrahepatic biliary ductal dilatation. Common bile duct is also mildly dilated measuring 8 mm in the porta hepatis. In the distal common bile duct there is a 4 mm filling defect (coronal image 25 of series 12), compatible with choledocholithiasis. There may be another filling defect immediately before the ampulla, however, this is difficult to judge secondary to motion. In addition there is a large signal void in the neck of the gallbladder measuring 2.3 x 1.5 cm, compatible with a gallstone. Some amorphous material lies dependently in the gallbladder and is intermediate intensity on T1 weighted images and slightly low signal intensity on T2 weighted images, compatible with biliary sludge. Gallbladder wall is diffusely thickened and edematous. There is an abnormal appearance of the anteromedial aspect of the wall which is irregular in shape, which may simply reflect a fold, however, the possibility of pending gallbladder perforation in this region is not excluded (best appreciated on axial image 23 of series 10 and 23 of series 7). Increased T2 signal intensity surrounding the gallbladder, indicative of acute inflammation. Pancreas: No pancreatic mass. No  pancreatic ductal dilatation. No pancreatic or peripancreatic fluid or inflammatory changes. Spleen:  Unremarkable. Adrenals/Urinary Tract: Small amount of T2 signal intensity surrounding both kidneys, nonspecific. Unenhanced appearance of the kidneys and bilateral adrenal glands is otherwise unremarkable. Stomach/Bowel: Visualized portions are unremarkable. Vascular/Lymphatic: Aortic atherosclerosis. No aneurysm noted in the abdominal vasculature. Other: Trace amount of pericholecystic fluid. No significant volume of ascites noted in the visualized portions of the peritoneal cavity. Musculoskeletal: No aggressive appearing osseous lesions are noted in the visualized portions of the skeleton. IMPRESSION: 1. Cholelithiasis with evidence of acute cholecystitis. Irregularity of the anteromedial wall of the gallbladder may simply reflect a gallbladder fold, however, the possibility of pending gallbladder perforation is suspected and surgical evaluation is strongly recommended. 2. Choledocholithiasis. Mild intra and extrahepatic biliary ductal dilatation. 3. Heterogeneous hepatic steatosis. 4. Aortic atherosclerosis. These results will be called to the ordering clinician or representative by the Radiologist Assistant, and communication documented in the PACS or zVision Dashboard. Electronically Signed   By: Trudie Reed M.D.   On: 04/16/2018 08:44   US Abdomen Limited  Result Date: 04/15/2018 CLINICAL DATA:  Jaundice EXAM: ULTRASOUND ABDOMEN LIMITED RIGHT UPPER QUADRANT COMPARISON:  CT 04/15/2018 FINDINGS: Gallbladder: Shadowing stone within the gallbladder neck measuring 1.9 cm. Increased wall thickness of 8.8 mm with trace pericholecystic fluid. Negative sonographic Murphy. Small amount of intraluminal sludge. Common bile duct: Diameter: 4.9 mm Liver: Heterogeneous echotexture without focal hepatic abnormality. Portal vein is patent on color Doppler imaging with normal direction of blood flow towards the liver.  IMPRESSION: 1. Gallstone with increased wall thickness and small amount of pericholecystic fluid, raising concern for cholecystitis despite negative sonographic Murphy. Electronically Signed   By: [name] M.D.   On: 04/15/2018 03:39   Dg Abd Portable 1v  Result Date: 04/18/2018 CLINICAL DATA:  Abdominal pain for several days EXAM: PORTABLE ABDOMEN - 1 VIEW COMPARISON:  04/17/2018 FINDINGS: Scattered large and small bowel gas is noted. Previously seen contrast is again noted within the colon. Mildly prominent loops of small bowel are again noted and stable from the previous exam. No free air is noted. No other focal abnormality is seen. IMPRESSION: Bowel-gas  pattern stable from the prior exam consistent with small bowel ileus. Electronically Signed   By: Alcide Clever M.D.   On: 04/18/2018 10:23   Dg Abd Portable 1v  Result Date: 04/17/2018 CLINICAL DATA:  Ileus. EXAM: PORTABLE ABDOMEN - 1 VIEW COMPARISON:  04/15/2018 FINDINGS: Two supine views of the abdomen and pelvis. Gas-filled small bowel loops of up to 2.6 cm. Gas and contrast within normal caliber colon. No gross free intraperitoneal air. IMPRESSION: Upper normal caliber gas-filled loops of small bowel, favoring mild adynamic ileus. Electronically Signed   By: Jeronimo Greaves M.D.   On: 04/17/2018 13:07     Antimicrobials:   Zosyn day #4   Subjective: Patient reports some mild abdominal pain.  No bowel movements but is passing gas, we will continue to monitor  Objective: Vitals:   04/17/18 1348 04/17/18 2149 04/18/18 0430 04/18/18 0919  BP: (!) 143/72 119/61 134/71 (!) 141/69  Pulse: 91 92 89 91  Resp: 18 15 18    Temp: 98.7 F (37.1 C) 98.5 F (36.9 C) 99.1 F (37.3 C) 98.7 F (37.1 C)  TempSrc: Oral Oral Oral Oral  SpO2: 96% 92% 97% 97%  Weight:      Height:        Intake/Output Summary (Last 24 hours) at 04/18/2018 1141 Last data filed at 04/18/2018 1000 Gross per 24 hour  Intake 3235.04 ml  Output 375 ml  Net 2860.04 ml    Filed Weights   04/16/18 0955  Weight: 72.6 kg    Examination:  General exam: Appears calm and comfortable  Respiratory system: Clear to auscultation. Respiratory effort normal. Cardiovascular system: S1 & S2 heard, RRR. No JVD, murmurs, rubs, gallops or clicks. No pedal edema. Gastrointestinal system: Mildly tender to palpation significant improvement from prior exam, mildly tympanic mildly distended positive bowel sounds not rigid Central nervous system: Alert and oriented. No focal neurological deficits. Extremities: Symmetric 5 x 5 power.  Move all 4 extremities freely without focal deficit Skin: No rashes, lesions or ulcers Psychiatry: Judgement and insight appear normal. Mood & affect appropriate.     Data Reviewed: I have personally reviewed following labs and imaging studies  CBC: Recent Labs  Lab 04/14/18 1919 04/16/18 0203 04/17/18 0326 04/18/18 0748  WBC 13.8* 21.8* 13.2* 10.4  HGB 13.7 11.9* 10.0* 10.2*  HCT 39.8 34.3* 30.1* 30.5*  MCV 89.6 89.1 91.8 93.6  PLT 225 189 156 176   Basic Metabolic Panel: Recent Labs  Lab 04/14/18 1919 04/16/18 0203 04/17/18 0326 04/18/18 0748  NA 134* 135 136 139  K 3.2* 3.2* 3.8 4.3  CL 99 105 107 113*  CO2 21* 21* 22 20*  GLUCOSE 168* 135* 155* 129*  BUN 21 19 25* 24*  CREATININE 1.54* 1.52* 1.59* 1.57*  CALCIUM 9.3 7.8* 7.5* 7.9*  MG  --  1.6*  --  2.0   GFR: Estimated Creatinine Clearance: 28.6 mL/min (A) (by C-G formula based on SCr of 1.57 mg/dL (H)). Liver Function Tests: Recent Labs  Lab 04/14/18 1919 04/16/18 0203 04/17/18 0326 04/18/18 0748  AST 284* 66* 75* 61*  ALT 347* 155* 124* 109*  ALKPHOS 158* 96 89 153*  BILITOT 3.4* 2.0* 1.9* 1.6*  PROT 7.7 6.1* 5.4* 5.7*  ALBUMIN 3.9 2.9* 2.2* 2.4*   Recent Labs  Lab 04/14/18 1919 04/16/18 0203  LIPASE 23 18   No results for input(s): AMMONIA in the last 168 hours. Coagulation Profile: No results for input(s): INR, PROTIME in the last 168  hours.  Cardiac Enzymes: No results for input(s): CKTOTAL, CKMB, CKMBINDEX, TROPONINI in the last 168 hours. BNP (last 3 results) No results for input(s): PROBNP in the last 8760 hours. HbA1C: No results for input(s): HGBA1C in the last 72 hours. CBG: No results for input(s): GLUCAP in the last 168 hours. Lipid Profile: No results for input(s): CHOL, HDL, LDLCALC, TRIG, CHOLHDL, LDLDIRECT in the last 72 hours. Thyroid Function Tests: No results for input(s): TSH, T4TOTAL, FREET4, T3FREE, THYROIDAB in the last 72 hours. Anemia Panel: No results for input(s): VITAMINB12, FOLATE, FERRITIN, TIBC, IRON, RETICCTPCT in the last 72 hours. Sepsis Labs: No results for input(s): PROCALCITON, LATICACIDVEN in the last 168 hours.  Recent Results (from the past 240 hour(s))  Culture, Urine     Status: None   Collection Time: 04/16/18  8:50 AM  Result Value Ref Range Status   Specimen Description URINE, CLEAN CATCH  Final   Special Requests NONE  Final   Culture   Final    NO GROWTH Performed at [facility] Lab, [address], [place], [place] [address]    Report Status 04/17/2018 FINAL  Final  Surgical pcr screen     Status: None   Collection Time: 04/16/18  9:35 AM  Result Value Ref Range Status   MRSA, PCR NEGATIVE NEGATIVE Final   Staphylococcus aureus NEGATIVE NEGATIVE Final    Comment: (NOTE) The Xpert SA Assay (FDA approved for NASAL specimens in patients 6 years of age and older), is one component of a comprehensive surveillance program. It is not intended to diagnose infection nor to guide or monitor treatment. Performed at [facility], [address] [place], [place] [address]          Radiology Studies: Dg Abd Portable 1v  Result Date: 04/18/2018 CLINICAL DATA:  Abdominal pain for several days EXAM: PORTABLE ABDOMEN - 1 VIEW COMPARISON:  04/17/2018 FINDINGS: Scattered large and small bowel gas is noted. Previously seen contrast is again noted within the colon.  Mildly prominent loops of small bowel are again noted and stable from the previous exam. No free air is noted. No other focal abnormality is seen. IMPRESSION: Bowel-gas pattern stable from the prior exam consistent with small bowel ileus. Electronically Signed   By: Alcide Clever M.D.   On: 04/18/2018 10:23   Dg Abd Portable 1v  Result Date: 04/17/2018 CLINICAL DATA:  Ileus. EXAM: PORTABLE ABDOMEN - 1 VIEW COMPARISON:  04/15/2018 FINDINGS: Two supine views of the abdomen and pelvis. Gas-filled small bowel loops of up to 2.6 cm. Gas and contrast within normal caliber colon. No gross free intraperitoneal air. IMPRESSION: Upper normal caliber gas-filled loops of small bowel, favoring mild adynamic ileus. Electronically Signed   By: Jeronimo Greaves M.D.   On: 04/17/2018 13:07        Scheduled Meds: . amLODipine  2.5 mg Oral Daily  . doxazosin  4 mg Oral Daily  . metoprolol succinate  25 mg Oral Daily  . multivitamin  1 tablet Oral Daily  . sodium chloride flush  3 mL Intravenous Once  . vitamin C  1,000 mg Oral Daily   Continuous Infusions: . dextrose 5 % and 0.9 % NaCl with KCl 40 mEq/L 75 mL/hr at 04/18/18 1000  . lactated ringers Stopped (04/16/18 1211)  . magnesium sulfate 1 - 4 g bolus IVPB    . piperacillin-tazobactam (ZOSYN)  IV 3.375 g (04/18/18 0628)     LOS: 3 days    Time spent: 35  MIN    Burke Keels, MD [facility]  If 7PM-7AM, please contact night-coverage  04/18/2018, 11:41 AM

## 2018-04-18 NOTE — Progress Notes (Signed)
All LFT's trending down slightly (about 20%).  Patient indicates he feels "a little better."  Some nausea, just mild pain at rest.  Still does not feel ready for solid food.  No BMs, but passing small amounts of gas.  On exam, his abdomen is slightly distended and tympanitic and there is some "jumpiness" and tenderness on the right side of the abdomen, without peritoneal findings.  Impression:  1.  Status post cholecystectomy for acute cholecystitis with marked improvement in white count and gradual improvement in pain.  2.  Equivocal intraoperative cholangiogram, showing possible small stones, with elevated liver chemistries gradually improving.  Unclear if this elevation is a reflection of his gallbladder inflammation or a reflection of choledocholithiasis.  Plan:  1.  Monitor LFTs.  2.  The criterion for further evaluation of the common duct while still in the hospital would be inability to advance diet or a rise in liver chemistries.  3.  Otherwise, I think it would be reasonable to continue to follow his LFTs as an outpatient and, if they remain elevated, consider either an EUS or an MRCP (the former being more accurate, but also more invasive, than the latter).  Florencia Reasons, M.D. Pager [phone] If no answer or after 5 PM call [phone]

## 2018-04-18 NOTE — Progress Notes (Addendum)
[facility]/Trauma Progress Note  2 Days Post-Op   Assessment/Plan Hx PAF on chronic apixaban-last dose 3/4 COPD/Hx tobacco use Hypertension Osteoarthritis CKD  Acute cholecystitis  - S/P lap chole with IOC, Dr. Corliss Skains, 03/06 - continue IV abx Possible choledocholithiasis - IOC showed nonocclusive filling defect, further management per GI - possible ERCP tomorrow per GI  - LFT's trending down, Tbili down to 1.6 from 1.9 Possible Ileus - AXR 03/07 showed mild ileus, ambulate, having flatus, okay to advance to FLD  FEN: IVF, FLD RV:UYEBX 3/5>>   WBC 10.4 down from 21.8 a few days ago DVT: SCD Follow up: TBD   LOS: 3 days    Subjective: CC: no complaints  Having flatus but no BM. He is tolerating a CLD without nausea or vomiting. No family at bedside. No issues overnight. He was up walking the halls yesterday.   Objective: Vital signs in last 24 hours: Temp:  [98.5 F (36.9 C)-99.1 F (37.3 C)] 98.7 F (37.1 C) (03/08 0919) Pulse Rate:  [89-92] 91 (03/08 0919) Resp:  [15-18] 18 (03/08 0430) BP: (119-143)/(61-72) 141/69 (03/08 0919) SpO2:  [92 %-97 %] 97 % (03/08 0919) Last BM Date: 04/14/18  Intake/Output from previous day: 03/07 0701 - 03/08 0700 In: 2701.7 [P.O.:1275; I.V.:1326.7; IV Piggyback:100] Out: 375 [Urine:375] Intake/Output this shift: No intake/output data recorded.  PE: Gen:  Alert, NAD, pleasant, ill appearing Pulm:  Rate and effort normal Abd: Soft, distended, incisions with dressings intact, +BS, generalized TTP with guarding, no peritonitis  Skin: warm and dry   Anti-infectives: Anti-infectives (From admission, onward)   Start     Dose/Rate Route Frequency Ordered Stop   04/16/18 0830  piperacillin-tazobactam (ZOSYN) IVPB 3.375 g     3.375 g 12.5 mL/hr over 240 Minutes Intravenous Every 8 hours 04/16/18 0755     04/15/18 0315  piperacillin-tazobactam (ZOSYN) IVPB 3.375 g     3.375 g 100 mL/hr over 30 Minutes  Intravenous  Once 04/15/18 0305 04/15/18 0514      Lab Results:  Recent Labs    04/17/18 0326 04/18/18 0748  WBC 13.2* 10.4  HGB 10.0* 10.2*  HCT 30.1* 30.5*  PLT 156 176   BMET Recent Labs    04/17/18 0326 04/18/18 0748  NA 136 139  K 3.8 4.3  CL 107 113*  CO2 22 20*  GLUCOSE 155* 129*  BUN 25* 24*  CREATININE 1.59* 1.57*  CALCIUM 7.5* 7.9*   PT/INR No results for input(s): LABPROT, INR in the last 72 hours. CMP     Component Value Date/Time   NA 139 04/18/2018 0748   NA 138 03/17/2018 1554   K 4.3 04/18/2018 0748   CL 113 (H) 04/18/2018 0748   CO2 20 (L) 04/18/2018 0748   GLUCOSE 129 (H) 04/18/2018 0748   BUN 24 (H) 04/18/2018 0748   BUN 20 03/17/2018 1554   CREATININE 1.57 (H) 04/18/2018 0748   CALCIUM 7.9 (L) 04/18/2018 0748   PROT 5.7 (L) 04/18/2018 0748   ALBUMIN 2.4 (L) 04/18/2018 0748   AST 61 (H) 04/18/2018 0748   ALT 109 (H) 04/18/2018 0748   ALKPHOS 153 (H) 04/18/2018 0748   BILITOT 1.6 (H) 04/18/2018 0748   GFRNONAA 38 (L) 04/18/2018 0748   GFRAA 44 (L) 04/18/2018 0748   Lipase     Component Value Date/Time   LIPASE 18 04/16/2018 0203    Studies/Results: Dg Cholangiogram Operative  Result Date: 04/16/2018 CLINICAL DATA:  Intraoperative cholangiogram during laparoscopic cholecystectomy. EXAM: INTRAOPERATIVE  CHOLANGIOGRAM FLUOROSCOPY TIME:  20 seconds COMPARISON:  MRCP-04/15/2018 FINDINGS: Intraoperative cholangiographic images of the right upper abdominal quadrant during laparoscopic cholecystectomy are provided for review. Surgical clips overlie the expected location of the gallbladder fossa. Contrast injection demonstrates selective cannulation of the central aspect of the cystic duct. There is passage of contrast through the central aspect of the cystic duct with filling of a mildly dilated common bile duct. There is passage of contrast though the CBD and into the descending portion of the duodenum. There is minimal reflux of injected contrast  into the common hepatic duct and central aspect of the non dilated intrahepatic biliary system. There is a lenticular shaped nonocclusive filling defect within the distal aspect of the CBD which could be indicative of choledocholithiasis questioned on preceding MRCP. IMPRESSION: Lenticular shaped nonocclusive filling defect within the distal aspect of the CBD, potentially artifactual due to underdistention though could be indicative of nonocclusive choledocholithiasis questioned on preceding MRCP. Further evaluation and management with ERCP could be performed as indicated. Electronically Signed   By: Simonne Come M.D.   On: 04/16/2018 11:51   Dg Abd Portable 1v  Result Date: 04/18/2018 CLINICAL DATA:  Abdominal pain for several days EXAM: PORTABLE ABDOMEN - 1 VIEW COMPARISON:  04/17/2018 FINDINGS: Scattered large and small bowel gas is noted. Previously seen contrast is again noted within the colon. Mildly prominent loops of small bowel are again noted and stable from the previous exam. No free air is noted. No other focal abnormality is seen. IMPRESSION: Bowel-gas pattern stable from the prior exam consistent with small bowel ileus. Electronically Signed   By: Alcide Clever M.D.   On: 04/18/2018 10:23   Dg Abd Portable 1v  Result Date: 04/17/2018 CLINICAL DATA:  Ileus. EXAM: PORTABLE ABDOMEN - 1 VIEW COMPARISON:  04/15/2018 FINDINGS: Two supine views of the abdomen and pelvis. Gas-filled small bowel loops of up to 2.6 cm. Gas and contrast within normal caliber colon. No gross free intraperitoneal air. IMPRESSION: Upper normal caliber gas-filled loops of small bowel, favoring mild adynamic ileus. Electronically Signed   By: Jeronimo Greaves M.D.   On: 04/17/2018 13:07      Jerre Simon , [facility] 04/18/2018, 10:37 AM  Pager: [phone] Mon-Wed, Friday 7:00am-4:30pm Thurs 7am-11:30am  Consults: [phone]

## 2018-04-19 ENCOUNTER — Encounter (HOSPITAL_COMMUNITY): Payer: Self-pay | Admitting: Surgery

## 2018-04-19 ENCOUNTER — Inpatient Hospital Stay (HOSPITAL_COMMUNITY): Payer: Medicare Other

## 2018-04-19 LAB — CBC
HCT: 32.3 % — ABNORMAL LOW (ref 39.0–52.0)
HEMOGLOBIN: 10.4 g/dL — AB (ref 13.0–17.0)
MCH: 30.4 pg (ref 26.0–34.0)
MCHC: 32.2 g/dL (ref 30.0–36.0)
MCV: 94.4 fL (ref 80.0–100.0)
Platelets: 193 10*3/uL (ref 150–400)
RBC: 3.42 MIL/uL — ABNORMAL LOW (ref 4.22–5.81)
RDW: 13.2 % (ref 11.5–15.5)
WBC: 9.3 10*3/uL (ref 4.0–10.5)
nRBC: 0 % (ref 0.0–0.2)

## 2018-04-19 LAB — COMPREHENSIVE METABOLIC PANEL
ALT: 93 U/L — ABNORMAL HIGH (ref 0–44)
AST: 45 U/L — ABNORMAL HIGH (ref 15–41)
Albumin: 2.3 g/dL — ABNORMAL LOW (ref 3.5–5.0)
Alkaline Phosphatase: 165 U/L — ABNORMAL HIGH (ref 38–126)
Anion gap: 6 (ref 5–15)
BUN: 19 mg/dL (ref 8–23)
CALCIUM: 7.9 mg/dL — AB (ref 8.9–10.3)
CO2: 22 mmol/L (ref 22–32)
Chloride: 113 mmol/L — ABNORMAL HIGH (ref 98–111)
Creatinine, Ser: 1.56 mg/dL — ABNORMAL HIGH (ref 0.61–1.24)
GFR calc Af Amer: 45 mL/min — ABNORMAL LOW (ref >60)
GFR calc non Af Amer: 39 mL/min — ABNORMAL LOW (ref >60)
GLUCOSE: 125 mg/dL — AB (ref 70–99)
Potassium: 4.2 mmol/L (ref 3.5–5.1)
SODIUM: 141 mmol/L (ref 135–145)
Total Bilirubin: 1.6 mg/dL — ABNORMAL HIGH (ref 0.3–1.2)
Total Protein: 5.8 g/dL — ABNORMAL LOW (ref 6.5–8.1)

## 2018-04-19 MED ORDER — POLYETHYLENE GLYCOL 3350 17 G PO PACK
17.0000 g | PACK | Freq: Every day | ORAL | Status: DC
  Administered 2018-04-19 – 2018-04-23 (×3): 17 g via ORAL
  Filled 2018-04-19 (×3): qty 1

## 2018-04-19 MED ORDER — APIXABAN 5 MG PO TABS: 5.0000 mg | ORAL_TABLET | Freq: Two times a day (BID) | ORAL | Status: DC

## 2018-04-19 NOTE — Progress Notes (Signed)
PROGRESS NOTE    Vincent Barrett  UPJ:[id number] DOB: 05-24-1925 DOA: 04/14/2018 PCP: [name], MD   Brief Narrative:  85 year old white male with a past no history notable for hypertension, A. fib on Eliquis, chronic kidney disease stage III BPH presented to the emergency room with abdominal pain.  Patient had MRCP which was concerning for possible ruptured gallbladder and patient was taken to the OR urgently and is now status post cholecystectomy postoperative day #3.  Intraoperative cholangiogram was concerning for partial filling defects and GI is following for possible ERCP versus EUS.   Assessment & Plan:   Principal Problem:   Cholelithiasis with choledocholithiasis Active Problems:   Atrial fibrillation (HCC)   Essential hypertension   COPD (chronic obstructive pulmonary disease) (HCC)   On anticoagulant therapy   Abdominal pain   Hypokalemia   CKD (chronic kidney disease) stage 3, GFR 30-59 ml/min (HCC)   Abdominal pain with transaminitis,cholelithiasis, choledocholithiasis adynamic ileus. As previously noted patient presented with an obstructive pattern. MRCP was concerning for possible ruptured gallbladder and patient is now status post cholecystectomy postop day #3. Intraoperative cholangiogram was concerning for partial filling defects. GI is following regarding possible ERCP vs EUS. We will continue Zosyn, monitor electrolytes and CBC. White blood cell count down to 9.3 from 22.   Discussed case with GI on "Sunday.  As LFTs are trending down, will continue to monitor, consider EUS versus ERCP.  Patient is pain-free and tolerating diet can follow-up outpatient. We will repeat a KUB today secondary to abdominal pain in the setting of ileus.  Paroxysmal A. fib. Eliquis held preoperatively. We will continue to hold secondary concerns for possible ERCP by GI.    Will continue to hold Eliquis for possible ERCP, await GI input before restarting. Continue home medications  otherwise.  Hypertension patient's losartan hydrochlorothiazide were held preoperatively. We will continue beta-blocker and doxazosin. Will titrate medications back in as blood pressure dictates-HAS BEEN mildly elevated today likely secondary abdominal discomfort.  CKD stage III patient's near baseline. Will avoid nephrotoxins continue to monitor electrolytes daily.  Hypokalemia: Repleted and normal  DVT prophylaxis: SCD/Compression stockings  Code Status: Full code    Code Status Orders  (From admission, onward)         Start     Ordered   04/15/18 0809  Full code  Continuous     03" /05/20 0808        Code Status History    This patient has a current code status but no historical code status.     Family Communication: None present expecting wife this afternoon Disposition Plan:   Patient remained inpatient for continued postoperative care in the setting of postoperative ileus, with abdominal pain, will continue with expert consultation with GI for possible ERCP secondary partial filling defects on intraoperative cholangiogram. Consults called: None Admission status: Inpatient   Consultants:   GI, general surgery  Procedures:  Dg Cholangiogram Operative  Result Date: 04/16/2018 CLINICAL DATA:  Intraoperative cholangiogram during laparoscopic cholecystectomy. EXAM: INTRAOPERATIVE CHOLANGIOGRAM FLUOROSCOPY TIME:  20 seconds COMPARISON:  MRCP-04/15/2018 FINDINGS: Intraoperative cholangiographic images of the right upper abdominal quadrant during laparoscopic cholecystectomy are provided for review. Surgical clips overlie the expected location of the gallbladder fossa. Contrast injection demonstrates selective cannulation of the central aspect of the cystic duct. There is passage of contrast through the central aspect of the cystic duct with filling of a mildly dilated common bile duct. There is passage of contrast though the CBD and into the  descending portion of the duodenum.  There is minimal reflux of injected contrast into the common hepatic duct and central aspect of the non dilated intrahepatic biliary system. There is a lenticular shaped nonocclusive filling defect within the distal aspect of the CBD which could be indicative of choledocholithiasis questioned on preceding MRCP. IMPRESSION: Lenticular shaped nonocclusive filling defect within the distal aspect of the CBD, potentially artifactual due to underdistention though could be indicative of nonocclusive choledocholithiasis questioned on preceding MRCP. Further evaluation and management with ERCP could be performed as indicated. Electronically Signed   By: Simonne Come M.D.   On: 04/16/2018 11:51   Ct Abdomen Pelvis W Contrast  Addendum Date: 04/15/2018   ADDENDUM REPORT: 04/15/2018 03:42 ADDENDUM: Additional clinical information, jaundice. The patient's CT images are reviewed. Extrahepatic bile duct is slightly enlarged, measuring up to 9 mm. Questionable density within the distal duct at the head of pancreas. Recommend correlation with LFTs. MRCP could be obtained if concern for ductal obstruction is given history of jaundice Electronically Signed   By: [name] M.D.   On: 04/15/2018 03:42   Result Date: 04/15/2018 CLINICAL DATA:  Abdominal pain with nausea and vomiting EXAM: CT ABDOMEN AND PELVIS WITH CONTRAST TECHNIQUE: Multidetector CT imaging of the abdomen and pelvis was performed using the standard protocol following bolus administration of intravenous contrast. CONTRAST:  80mL OMNIPAQUE IOHEXOL 300 MG/ML  SOLN COMPARISON:  PET CT 01/08/2018, CT 12/30/2017 FINDINGS: Lower chest: Lung bases again demonstrate oval subpleural mass in the left lower lobe, this measures 4.1 x 2.2 cm and appears slightly increased in size. No significant pleural effusion. Small hiatal hernia. Hepatobiliary: Cyst at the dome of the liver. Calcified stone at the neck of the gallbladder. Suspected edema or fluid adjacent to the  gallbladder fundus. No biliary dilatation Pancreas: Unremarkable. No pancreatic ductal dilatation or surrounding inflammatory changes. Spleen: Normal in size without focal abnormality. Adrenals/Urinary Tract: Adrenal glands are unremarkable. Kidneys are normal, without renal calculi, focal lesion, or hydronephrosis. Bladder is unremarkable. Stomach/Bowel: The stomach is nonenlarged. No dilated small bowel. No colon wall thickening. Negative appendix. Colon diverticular disease without acute inflammatory change. Vascular/Lymphatic: Moderate aortic atherosclerosis. No aneurysm. No significantly enlarged lymph nodes. Reproductive: Enlarged heterogeneous prostate with calcification Other: Negative for free air or free fluid Musculoskeletal: Multiple level degenerative changes of the spine. Chronic compression deformity L1. IMPRESSION: 1. 2.3 cm calcified stone at the gallbladder neck. Suspected gallbladder wall edema versus small amount of pericholecystic fluid. Suggest correlation with ultrasound. 2. Slight interval increase in size of subpleural left lower lobe lung mass now measuring 4.1 cm. 3. Diverticular disease of the colon without acute inflammatory change Electronically Signed: By: [name] M.D. On: 04/15/2018 02:38   Mr Abdomen Mrcp Wo Contrast  Result Date: 04/16/2018 CLINICAL DATA:  85 year old male with history of abdominal pain, concerning for an acute cholecystitis. Additional history of paroxysmal atrial fibrillation and atrial flutter on Eliquis. Hypertension. COPD. EXAM: MRI ABDOMEN WITHOUT CONTRAST  (INCLUDING MRCP) TECHNIQUE: Multiplanar multisequence MR imaging of the abdomen was performed. Heavily T2-weighted images of the biliary and pancreatic ducts were obtained, and three-dimensional MRCP images were rendered by post processing. COMPARISON:  No priors. FINDINGS: Comment: Today's study is limited for detection and characterization of visceral and/or vascular lesions by lack of IV  gadolinium. Lower chest: Unremarkable. Hepatobiliary: Heterogeneous loss of signal intensity throughout the hepatic parenchyma on out of phase dual echo images, indicative of hepatic steatosis. Several small T1 hypointense, T2 hyperintense lesions are  noted in the liver, incompletely characterized on today's noncontrast examination, but presumably small cysts and/or biliary hamartomas, largest of which has some thin internal septations and measures up to 12 mm in segment 4A. MRCP images demonstrate very mild intrahepatic biliary ductal dilatation. Common bile duct is also mildly dilated measuring 8 mm in the porta hepatis. In the distal common bile duct there is a 4 mm filling defect (coronal image 25 of series 12), compatible with choledocholithiasis. There may be another filling defect immediately before the ampulla, however, this is difficult to judge secondary to motion. In addition there is a large signal void in the neck of the gallbladder measuring 2.3 x 1.5 cm, compatible with a gallstone. Some amorphous material lies dependently in the gallbladder and is intermediate intensity on T1 weighted images and slightly low signal intensity on T2 weighted images, compatible with biliary sludge. Gallbladder wall is diffusely thickened and edematous. There is an abnormal appearance of the anteromedial aspect of the wall which is irregular in shape, which may simply reflect a fold, however, the possibility of pending gallbladder perforation in this region is not excluded (best appreciated on axial image 23 of series 10 and 23 of series 7). Increased T2 signal intensity surrounding the gallbladder, indicative of acute inflammation. Pancreas: No pancreatic mass. No pancreatic ductal dilatation. No pancreatic or peripancreatic fluid or inflammatory changes. Spleen:  Unremarkable. Adrenals/Urinary Tract: Small amount of T2 signal intensity surrounding both kidneys, nonspecific. Unenhanced appearance of the kidneys and  bilateral adrenal glands is otherwise unremarkable. Stomach/Bowel: Visualized portions are unremarkable. Vascular/Lymphatic: Aortic atherosclerosis. No aneurysm noted in the abdominal vasculature. Other: Trace amount of pericholecystic fluid. No significant volume of ascites noted in the visualized portions of the peritoneal cavity. Musculoskeletal: No aggressive appearing osseous lesions are noted in the visualized portions of the skeleton. IMPRESSION: 1. Cholelithiasis with evidence of acute cholecystitis. Irregularity of the anteromedial wall of the gallbladder may simply reflect a gallbladder fold, however, the possibility of pending gallbladder perforation is suspected and surgical evaluation is strongly recommended. 2. Choledocholithiasis. Mild intra and extrahepatic biliary ductal dilatation. 3. Heterogeneous hepatic steatosis. 4. Aortic atherosclerosis. These results will be called to the ordering clinician or representative by the Radiologist Assistant, and communication documented in the PACS or zVision Dashboard. Electronically Signed   By: Trudie Reed M.D.   On: 04/16/2018 08:44   Mr 3d Recon At Scanner  Result Date: 04/16/2018 CLINICAL DATA:  85 year old male with history of abdominal pain, concerning for an acute cholecystitis. Additional history of paroxysmal atrial fibrillation and atrial flutter on Eliquis. Hypertension. COPD. EXAM: MRI ABDOMEN WITHOUT CONTRAST  (INCLUDING MRCP) TECHNIQUE: Multiplanar multisequence MR imaging of the abdomen was performed. Heavily T2-weighted images of the biliary and pancreatic ducts were obtained, and three-dimensional MRCP images were rendered by post processing. COMPARISON:  No priors. FINDINGS: Comment: Today's study is limited for detection and characterization of visceral and/or vascular lesions by lack of IV gadolinium. Lower chest: Unremarkable. Hepatobiliary: Heterogeneous loss of signal intensity throughout the hepatic parenchyma on out of phase  dual echo images, indicative of hepatic steatosis. Several small T1 hypointense, T2 hyperintense lesions are noted in the liver, incompletely characterized on today's noncontrast examination, but presumably small cysts and/or biliary hamartomas, largest of which has some thin internal septations and measures up to 12 mm in segment 4A. MRCP images demonstrate very mild intrahepatic biliary ductal dilatation. Common bile duct is also mildly dilated measuring 8 mm in the porta hepatis. In the distal common bile duct  there is a 4 mm filling defect (coronal image 25 of series 12), compatible with choledocholithiasis. There may be another filling defect immediately before the ampulla, however, this is difficult to judge secondary to motion. In addition there is a large signal void in the neck of the gallbladder measuring 2.3 x 1.5 cm, compatible with a gallstone. Some amorphous material lies dependently in the gallbladder and is intermediate intensity on T1 weighted images and slightly low signal intensity on T2 weighted images, compatible with biliary sludge. Gallbladder wall is diffusely thickened and edematous. There is an abnormal appearance of the anteromedial aspect of the wall which is irregular in shape, which may simply reflect a fold, however, the possibility of pending gallbladder perforation in this region is not excluded (best appreciated on axial image 23 of series 10 and 23 of series 7). Increased T2 signal intensity surrounding the gallbladder, indicative of acute inflammation. Pancreas: No pancreatic mass. No pancreatic ductal dilatation. No pancreatic or peripancreatic fluid or inflammatory changes. Spleen:  Unremarkable. Adrenals/Urinary Tract: Small amount of T2 signal intensity surrounding both kidneys, nonspecific. Unenhanced appearance of the kidneys and bilateral adrenal glands is otherwise unremarkable. Stomach/Bowel: Visualized portions are unremarkable. Vascular/Lymphatic: Aortic  atherosclerosis. No aneurysm noted in the abdominal vasculature. Other: Trace amount of pericholecystic fluid. No significant volume of ascites noted in the visualized portions of the peritoneal cavity. Musculoskeletal: No aggressive appearing osseous lesions are noted in the visualized portions of the skeleton. IMPRESSION: 1. Cholelithiasis with evidence of acute cholecystitis. Irregularity of the anteromedial wall of the gallbladder may simply reflect a gallbladder fold, however, the possibility of pending gallbladder perforation is suspected and surgical evaluation is strongly recommended. 2. Choledocholithiasis. Mild intra and extrahepatic biliary ductal dilatation. 3. Heterogeneous hepatic steatosis. 4. Aortic atherosclerosis. These results will be called to the ordering clinician or representative by the Radiologist Assistant, and communication documented in the PACS or zVision Dashboard. Electronically Signed   By: Trudie Reed M.D.   On: 04/16/2018 08:44   US Abdomen Limited  Result Date: 04/15/2018 CLINICAL DATA:  Jaundice EXAM: ULTRASOUND ABDOMEN LIMITED RIGHT UPPER QUADRANT COMPARISON:  CT 04/15/2018 FINDINGS: Gallbladder: Shadowing stone within the gallbladder neck measuring 1.9 cm. Increased wall thickness of 8.8 mm with trace pericholecystic fluid. Negative sonographic Murphy. Small amount of intraluminal sludge. Common bile duct: Diameter: 4.9 mm Liver: Heterogeneous echotexture without focal hepatic abnormality. Portal vein is patent on color Doppler imaging with normal direction of blood flow towards the liver. IMPRESSION: 1. Gallstone with increased wall thickness and small amount of pericholecystic fluid, raising concern for cholecystitis despite negative sonographic Murphy. Electronically Signed   By: [name] M.D.   On: 04/15/2018 03:39   Dg Abd Portable 1v  Result Date: 04/18/2018 CLINICAL DATA:  Abdominal pain for several days EXAM: PORTABLE ABDOMEN - 1 VIEW COMPARISON:   04/17/2018 FINDINGS: Scattered large and small bowel gas is noted. Previously seen contrast is again noted within the colon. Mildly prominent loops of small bowel are again noted and stable from the previous exam. No free air is noted. No other focal abnormality is seen. IMPRESSION: Bowel-gas pattern stable from the prior exam consistent with small bowel ileus. Electronically Signed   By: Alcide Clever M.D.   On: 04/18/2018 10:23   Dg Abd Portable 1v  Result Date: 04/17/2018 CLINICAL DATA:  Ileus. EXAM: PORTABLE ABDOMEN - 1 VIEW COMPARISON:  04/15/2018 FINDINGS: Two supine views of the abdomen and pelvis. Gas-filled small bowel loops of up to 2.6 cm. Gas and  contrast within normal caliber colon. No gross free intraperitoneal air. IMPRESSION: Upper normal caliber gas-filled loops of small bowel, favoring mild adynamic ileus. Electronically Signed   By: Jeronimo Greaves M.D.   On: 04/17/2018 13:07     Antimicrobials:   Zosyn day #5   Subjective: Patient reports abdominal pain, mildly tender to palpation.  Mildly distended consistent with known ileus.  Objective: Vitals:   04/18/18 0919 04/18/18 1552 04/18/18 2114 04/19/18 0455  BP: (!) 141/69 135/68 (!) 145/73 (!) 155/73  Pulse: 91 88 89 63  Resp:   18 18  Temp: 98.7 F (37.1 C) 98.2 F (36.8 C) 98.6 F (37 C) 97.9 F (36.6 C)  TempSrc: Oral Oral Oral   SpO2: 97% 100% 96% 98%  Weight:      Height:        Intake/Output Summary (Last 24 hours) at 04/19/2018 1225 Last data filed at 04/19/2018 0900 Gross per 24 hour  Intake 2516.37 ml  Output 870 ml  Net 1646.37 ml   Filed Weights   04/16/18 0955  Weight: 72.6 kg    Examination:  General exam: Appears calm and comfortable  Respiratory system: Clear to auscultation. Respiratory effort normal. Cardiovascular system: S1 & S2 heard, RRR. No JVD, murmurs, rubs, gallops or clicks. No pedal edema. Gastrointestinal system: Mildly tympanic, mildly distended, tender to palpation.  Quiet bowel  sounds Central nervous system: Alert and oriented. No focal neurological deficits. Extremities: Symmetric 5 x 5 power.  No edema Skin: No rashes, lesions or ulcers Psychiatry: Judgement and insight appear normal. Mood & affect appropriate.     Data Reviewed: I have personally reviewed following labs and imaging studies  CBC: Recent Labs  Lab 04/14/18 1919 04/16/18 0203 04/17/18 0326 04/18/18 0748 04/19/18 0410  WBC 13.8* 21.8* 13.2* 10.4 9.3  HGB 13.7 11.9* 10.0* 10.2* 10.4*  HCT 39.8 34.3* 30.1* 30.5* 32.3*  MCV 89.6 89.1 91.8 93.6 94.4  PLT 225 189 156 176 193   Basic Metabolic Panel: Recent Labs  Lab 04/14/18 1919 04/16/18 0203 04/17/18 0326 04/18/18 0748 04/19/18 0410  NA 134* 135 136 139 141  K 3.2* 3.2* 3.8 4.3 4.2  CL 99 105 107 113* 113*  CO2 21* 21* 22 20* 22  GLUCOSE 168* 135* 155* 129* 125*  BUN 21 19 25* 24* 19  CREATININE 1.54* 1.52* 1.59* 1.57* 1.56*  CALCIUM 9.3 7.8* 7.5* 7.9* 7.9*  MG  --  1.6*  --  2.0  --    GFR: Estimated Creatinine Clearance: 28.8 mL/min (A) (by C-G formula based on SCr of 1.56 mg/dL (H)). Liver Function Tests: Recent Labs  Lab 04/14/18 1919 04/16/18 0203 04/17/18 0326 04/18/18 0748 04/19/18 0410  AST 284* 66* 75* 61* 45*  ALT 347* 155* 124* 109* 93*  ALKPHOS 158* 96 89 153* 165*  BILITOT 3.4* 2.0* 1.9* 1.6* 1.6*  PROT 7.7 6.1* 5.4* 5.7* 5.8*  ALBUMIN 3.9 2.9* 2.2* 2.4* 2.3*   Recent Labs  Lab 04/14/18 1919 04/16/18 0203  LIPASE 23 18   No results for input(s): AMMONIA in the last 168 hours. Coagulation Profile: No results for input(s): INR, PROTIME in the last 168 hours. Cardiac Enzymes: No results for input(s): CKTOTAL, CKMB, CKMBINDEX, TROPONINI in the last 168 hours. BNP (last 3 results) No results for input(s): PROBNP in the last 8760 hours. HbA1C: No results for input(s): HGBA1C in the last 72 hours. CBG: No results for input(s): GLUCAP in the last 168 hours. Lipid Profile: No results for input(s):  CHOL, HDL, LDLCALC, TRIG, CHOLHDL, LDLDIRECT in the last 72 hours. Thyroid Function Tests: No results for input(s): TSH, T4TOTAL, FREET4, T3FREE, THYROIDAB in the last 72 hours. Anemia Panel: No results for input(s): VITAMINB12, FOLATE, FERRITIN, TIBC, IRON, RETICCTPCT in the last 72 hours. Sepsis Labs: No results for input(s): PROCALCITON, LATICACIDVEN in the last 168 hours.  Recent Results (from the past 240 hour(s))  Culture, Urine     Status: None   Collection Time: 04/16/18  8:50 AM  Result Value Ref Range Status   Specimen Description URINE, CLEAN CATCH  Final   Special Requests NONE  Final   Culture   Final    NO GROWTH Performed at [facility], [address] [place], [place] [address]    Report Status 04/17/2018 FINAL  Final  Surgical pcr screen     Status: None   Collection Time: 04/16/18  9:35 AM  Result Value Ref Range Status   MRSA, PCR NEGATIVE NEGATIVE Final   Staphylococcus aureus NEGATIVE NEGATIVE Final    Comment: (NOTE) The Xpert SA Assay (FDA approved for NASAL specimens in patients 43 years of age and older), is one component of a comprehensive surveillance program. It is not intended to diagnose infection nor to guide or monitor treatment. Performed at [facility], [address], [place], [place] [address]          Radiology Studies: Dg Abd Portable 1v  Result Date: 04/18/2018 CLINICAL DATA:  Abdominal pain for several days EXAM: PORTABLE ABDOMEN - 1 VIEW COMPARISON:  04/17/2018 FINDINGS: Scattered large and small bowel gas is noted. Previously seen contrast is again noted within the colon. Mildly prominent loops of small bowel are again noted and stable from the previous exam. No free air is noted. No other focal abnormality is seen. IMPRESSION: Bowel-gas pattern stable from the prior exam consistent with small bowel ileus. Electronically Signed   By: Alcide Clever M.D.   On: 04/18/2018 10:23        Scheduled Meds: . amLODipine   2.5 mg Oral Daily  . doxazosin  4 mg Oral Daily  . metoprolol succinate  25 mg Oral Daily  . multivitamin  1 tablet Oral Daily  . polyethylene glycol  17 g Oral Daily  . sodium chloride flush  3 mL Intravenous Once  . vitamin C  1,000 mg Oral Daily   Continuous Infusions: . dextrose 5 % and 0.9 % NaCl with KCl 40 mEq/L 75 mL/hr at 04/19/18 0046  . lactated ringers Stopped (04/16/18 1211)  . magnesium sulfate 1 - 4 g bolus IVPB    . piperacillin-tazobactam (ZOSYN)  IV 3.375 g (04/19/18 0557)     LOS: 4 days    Time spent: 35 min     Burke Keels, MD [facility]  If 7PM-7AM, please contact night-coverage  04/19/2018, 12:25 PM

## 2018-04-19 NOTE — Anesthesia Postprocedure Evaluation (Signed)
Anesthesia Post Note  Patient: Vincent Barrett  Procedure(s) Performed: LAPAROSCOPIC CHOLECYSTECTOMY WITH INTRAOPERATIVE CHOLANGIOGRAM (N/A Abdomen)     Patient location during evaluation: PACU Anesthesia Type: General Level of consciousness: awake and alert Pain management: pain level controlled Vital Signs Assessment: post-procedure vital signs reviewed and stable Respiratory status: spontaneous breathing, nonlabored ventilation, respiratory function stable and patient connected to nasal cannula oxygen Cardiovascular status: blood pressure returned to baseline and stable Postop Assessment: no apparent nausea or vomiting Anesthetic complications: no    Last Vitals:  Vitals:   04/18/18 2114 04/19/18 0455  BP: (!) 145/73 (!) 155/73  Pulse: 89 63  Resp: 18 18  Temp: 37 C 36.6 C  SpO2: 96% 98%    Last Pain:  Vitals:   04/18/18 2114  TempSrc: Oral  PainSc:                  Shelton Silvas

## 2018-04-19 NOTE — Plan of Care (Signed)

## 2018-04-19 NOTE — Progress Notes (Signed)
3 Days Post-Op   Subjective/Chief Complaint: BLOATED    Objective: Vital signs in last 24 hours: Temp:  [97.9 F (36.6 C)-98.7 F (37.1 C)] 97.9 F (36.6 C) (03/09 0455) Pulse Rate:  [63-91] 63 (03/09 0455) Resp:  [18] 18 (03/09 0455) BP: (135-155)/(68-73) 155/73 (03/09 0455) SpO2:  [96 %-100 %] 98 % (03/09 0455) Last BM Date: 04/14/18  Intake/Output from previous day: 03/08 0701 - 03/09 0700 In: 2144.4 [P.O.:600; I.V.:1363.6; IV Piggyback:180.8] Out: 820 [Urine:820] Intake/Output this shift: Total I/O In: -  Out: 100 [Urine:100]  Incision/Wound:CDI distended but soft  Quiet   Lab Results:  Recent Labs    04/18/18 0748 04/19/18 0410  WBC 10.4 9.3  HGB 10.2* 10.4*  HCT 30.5* 32.3*  PLT 176 193   BMET Recent Labs    04/18/18 0748 04/19/18 0410  NA 139 141  K 4.3 4.2  CL 113* 113*  CO2 20* 22  GLUCOSE 129* 125*  BUN 24* 19  CREATININE 1.57* 1.56*  CALCIUM 7.9* 7.9*   PT/INR No results for input(s): LABPROT, INR in the last 72 hours. ABG No results for input(s): PHART, HCO3 in the last 72 hours.  Invalid input(s): PCO2, PO2  Studies/Results: Dg Abd Portable 1v  Result Date: 04/18/2018 CLINICAL DATA:  Abdominal pain for several days EXAM: PORTABLE ABDOMEN - 1 VIEW COMPARISON:  04/17/2018 FINDINGS: Scattered large and small bowel gas is noted. Previously seen contrast is again noted within the colon. Mildly prominent loops of small bowel are again noted and stable from the previous exam. No free air is noted. No other focal abnormality is seen. IMPRESSION: Bowel-gas pattern stable from the prior exam consistent with small bowel ileus. Electronically Signed   By: Alcide Clever M.D.   On: 04/18/2018 10:23   Dg Abd Portable 1v  Result Date: 04/17/2018 CLINICAL DATA:  Ileus. EXAM: PORTABLE ABDOMEN - 1 VIEW COMPARISON:  04/15/2018 FINDINGS: Two supine views of the abdomen and pelvis. Gas-filled small bowel loops of up to 2.6 cm. Gas and contrast within normal  caliber colon. No gross free intraperitoneal air. IMPRESSION: Upper normal caliber gas-filled loops of small bowel, favoring mild adynamic ileus. Electronically Signed   By: Jeronimo Greaves M.D.   On: 04/17/2018 13:07    Anti-infectives: Anti-infectives (From admission, onward)   Start     Dose/Rate Route Frequency Ordered Stop   04/16/18 0830  piperacillin-tazobactam (ZOSYN) IVPB 3.375 g     3.375 g 12.5 mL/hr over 240 Minutes Intravenous Every 8 hours 04/16/18 0755     04/15/18 0315  piperacillin-tazobactam (ZOSYN) IVPB 3.375 g     3.375 g 100 mL/hr over 30 Minutes Intravenous  Once 04/15/18 0305 04/15/18 0514      Assessment/Plan: Hx PAF on chronic apixaban-last dose 3/4 COPD/Hx tobacco use Hypertension Osteoarthritis CKD  Acute cholecystitis - S/P lap chole with IOC, Dr. Corliss Skains, 03/06 - continue IV abx Possiblecholedocholithiasis - IOC showed nonocclusive filling defect, further management per GI - gi ON BOARD - LFT's trending down Possible Ileus - AXR 03/07 showed mild ileus, ambulate, having flatus, okay to advance to FLD  FEN:IVF, FLD MC:NOBSJ 3/5>> DVT: SCD Follow up: TBD   LOS: 4 days    Vincent Vincent Barrett Vincent Vincent Barrett Vincent Barrett 04/19/2018

## 2018-04-19 NOTE — Care Management Important Message (Signed)
Important Message  Patient Details  Name: Randi Mehlhorn MRN: [id number] Date of Birth: 1925/04/06   Medicare Important Message Given:  Yes    Bluma Buresh Stefan Church 04/19/2018, 4:34 PM

## 2018-04-19 NOTE — Progress Notes (Signed)
Vincent Barrett 4:31 PM  Subjective: Patient is having some abdominal pain but more nausea is tolerating clear liquids has no new complaints his hospital computer chart was reviewed his case discussed with my partner Dr. Buccini and his IOC and MRCP was reviewed  Objective: Vital signs stable afebrile no acute distress abdomen is slightly distended rare bowel sounds tender throughout without rebound CBC stable bili unchanged to transaminases slightly lower alk phos slightly higher MRCP more worrisome for CBD stone then IOC  Assessment: Questionable CBD stone  Plan: I have tentatively scheduled him for a EUS on Thursday morning by Dr. Outlaw and if positive I will proceed with an ERCP and I will revisit him tomorrow to discuss this case is further and make sure we want to proceed but I discussed the plan with him today as well  Vincent Barrett  Pager [phone] After 5PM or if no answer call [phone] 

## 2018-04-20 LAB — COMPREHENSIVE METABOLIC PANEL
ALT: 81 U/L — ABNORMAL HIGH (ref 0–44)
AST: 42 U/L — ABNORMAL HIGH (ref 15–41)
Albumin: 2.2 g/dL — ABNORMAL LOW (ref 3.5–5.0)
Alkaline Phosphatase: 179 U/L — ABNORMAL HIGH (ref 38–126)
Anion gap: 7 (ref 5–15)
BUN: 14 mg/dL (ref 8–23)
CO2: 20 mmol/L — ABNORMAL LOW (ref 22–32)
Calcium: 8.1 mg/dL — ABNORMAL LOW (ref 8.9–10.3)
Chloride: 111 mmol/L (ref 98–111)
Creatinine, Ser: 1.43 mg/dL — ABNORMAL HIGH (ref 0.61–1.24)
GFR calc Af Amer: 50 mL/min — ABNORMAL LOW (ref >60)
GFR, EST NON AFRICAN AMERICAN: 43 mL/min — AB (ref >60)
Glucose, Bld: 126 mg/dL — ABNORMAL HIGH (ref 70–99)
POTASSIUM: 4.2 mmol/L (ref 3.5–5.1)
Sodium: 138 mmol/L (ref 135–145)
Total Bilirubin: 1.6 mg/dL — ABNORMAL HIGH (ref 0.3–1.2)
Total Protein: 5.6 g/dL — ABNORMAL LOW (ref 6.5–8.1)

## 2018-04-20 LAB — CBC
HCT: 30.6 % — ABNORMAL LOW (ref 39.0–52.0)
Hemoglobin: 10.1 g/dL — ABNORMAL LOW (ref 13.0–17.0)
MCH: 31 pg (ref 26.0–34.0)
MCHC: 33 g/dL (ref 30.0–36.0)
MCV: 93.9 fL (ref 80.0–100.0)
Platelets: 209 10*3/uL (ref 150–400)
RBC: 3.26 MIL/uL — ABNORMAL LOW (ref 4.22–5.81)
RDW: 12.8 % (ref 11.5–15.5)
WBC: 10 10*3/uL (ref 4.0–10.5)
nRBC: 0 % (ref 0.0–0.2)

## 2018-04-20 LAB — MAGNESIUM: MAGNESIUM: 1.7 mg/dL (ref 1.7–2.4)

## 2018-04-20 MED ORDER — DOCUSATE SODIUM 100 MG PO CAPS
100.0000 mg | ORAL_CAPSULE | Freq: Two times a day (BID) | ORAL | Status: DC
  Administered 2018-04-20 – 2018-04-23 (×6): 100 mg via ORAL
  Filled 2018-04-20 (×7): qty 1

## 2018-04-20 MED ORDER — PROMETHAZINE HCL 25 MG/ML IJ SOLN: 12.5000 mg | Freq: Three times a day (TID) | INTRAMUSCULAR | Status: DC | PRN

## 2018-04-20 NOTE — Progress Notes (Signed)
4 Days Post-Op  Subjective: CC: Nausea Patient reports that he had a BM this AM as well as an episode of emesis. Some nausea overnight that has now resolved. No real appetite. Passing flatus. Continues to have some mid abdominal pain.   Objective: Vital signs in last 24 hours: Temp:  [98.2 F (36.8 C)-98.9 F (37.2 C)] 98.2 F (36.8 C) (03/10 0943) Pulse Rate:  [59-75] 60 (03/10 0943) Resp:  [16-18] 18 (03/10 0943) BP: (135-157)/(61-71) 157/67 (03/10 0943) SpO2:  [94 %-98 %] 98 % (03/10 0943) Last BM Date: 04/14/18  Intake/Output from previous day: 03/09 0701 - 03/10 0700 In: 2686.2 [P.O.:240; I.V.:2281.4; IV Piggyback:164.8] Out: 400 [Urine:400] Intake/Output this shift: No intake/output data recorded.  PE: Gen: Awake and alert, NAD Heart: Regular Lungs; Normal effort Abd: Soft, mild distension, tenderness in the upper abdomen without signs of peritonitis. Tegarderms removed. Steristrips in place underneath, c/d/i.   Lab Results:  Recent Labs    04/19/18 0410 04/20/18 0336  WBC 9.3 10.0  HGB 10.4* 10.1*  HCT 32.3* 30.6*  PLT 193 209   BMET Recent Labs    04/19/18 0410 04/20/18 0336  NA 141 138  K 4.2 4.2  CL 113* 111  CO2 22 20*  GLUCOSE 125* 126*  BUN 19 14  CREATININE 1.56* 1.43*  CALCIUM 7.9* 8.1*   PT/INR No results for input(s): LABPROT, INR in the last 72 hours. CMP     Component Value Date/Time   NA 138 04/20/2018 0336   NA 138 03/17/2018 1554   K 4.2 04/20/2018 0336   CL 111 04/20/2018 0336   CO2 20 (L) 04/20/2018 0336   GLUCOSE 126 (H) 04/20/2018 0336   BUN 14 04/20/2018 0336   BUN 20 03/17/2018 1554   CREATININE 1.43 (H) 04/20/2018 0336   CALCIUM 8.1 (L) 04/20/2018 0336   PROT 5.6 (L) 04/20/2018 0336   ALBUMIN 2.2 (L) 04/20/2018 0336   AST 42 (H) 04/20/2018 0336   ALT 81 (H) 04/20/2018 0336   ALKPHOS 179 (H) 04/20/2018 0336   BILITOT 1.6 (H) 04/20/2018 0336   GFRNONAA 43 (L) 04/20/2018 0336   GFRAA 50 (L) 04/20/2018 0336    Lipase     Component Value Date/Time   LIPASE 18 04/16/2018 0203       Studies/Results: Dg Abd 1 View  Result Date: 04/19/2018 CLINICAL DATA:  85 year old male with anterior abdominal pain. Status post cholecystectomy postoperative day 3. EXAM: ABDOMEN - 1 VIEW COMPARISON:  Radiographs 04/18/2018 and earlier. FINDINGS: Supine views. Stable cholecystectomy clips. Oral contrast which was in the right colon yesterday is now in the descending and visible sigmoid colon. Bowel gas pattern is within normal limits. No definite pneumoperitoneum on these supine views. Streaky left lung base opacity is stable. The right lung base appears clear. No acute osseous abnormality identified. IMPRESSION: 1. Normal bowel gas pattern with transit of oral contrast since yesterday into the descending and sigmoid colon. 2. Continued streaky left lung base opacity. Electronically Signed   By: Odessa Fleming M.D.   On: 04/19/2018 16:25    Anti-infectives: Anti-infectives (From admission, onward)   Start     Dose/Rate Route Frequency Ordered Stop   04/16/18 0830  piperacillin-tazobactam (ZOSYN) IVPB 3.375 g     3.375 g 12.5 mL/hr over 240 Minutes Intravenous Every 8 hours 04/16/18 0755     04/15/18 0315  piperacillin-tazobactam (ZOSYN) IVPB 3.375 g     3.375 g 100 mL/hr over 30 Minutes Intravenous  Once  04/15/18 0305 04/15/18 0514       Assessment/Plan  Hx PAF on chronic apixaban-last dose 3/4 COPD/Hx tobacco use Hypertension Osteoarthritis CKD  Acute cholecystitis - S/P lap chole with IOC, Dr. Corliss Skains, 03/06 - continue IV abx  Possiblecholedocholithiasis - IOC showed nonocclusive filling defect, further management per GI - Gi plans for EUS with Dr. Dulce Sellar on Thursday per notes.  - LFT's stable  Possible Ileus - AXR03/07 showed mild ileus - AXR on 3/9 w/ contrast in colon. Having BM and passing flatus. One episode of emesis this AM however. Will hold on advancing diet.  - Maximize nausea  medication. Mobilize, keep Mg>2 and K>4.   FEN:IVF,FLD, bowel regimen, K 4.2, Mg pending.  CB:ULAGT 3/5>> DVT: SCD Follow up: TBD   LOS: 5 days    Jacinto Halim , [facility] 04/20/2018, 10:55 AM Pager: [phone]

## 2018-04-20 NOTE — Progress Notes (Signed)
Upon assessment this morning, patient had expiratory wheezing. Denies any SOB or difficulty breathing. PRN Albuterol nebulizer administered. Will monitor patient.  Lyndal Pulley, RN 04/20/2018 9:53 AM

## 2018-04-20 NOTE — Progress Notes (Signed)
PROGRESS NOTE    Anuj Gunnoe  RAQ:[id number] DOB: Jul 09, 1925 DOA: 04/14/2018 PCP: [name], MD   Brief Narrative:  85 year old white male with a past med history notable for hypertension, A. fib on Eliquis, chronic kidney disease stage III BPH presented to the emergency room with abdominal pain.  Patient had MRCP which was concerning for possible ruptured gallbladder and patient was taken to the OR urgently and is now status post cholecystectomy postoperative day #3.  Intraoperative cholangiogram was concerning for partial filling defects and GI is following for planned EUS Thursday and possible ERCP.   Assessment & Plan:   Principal Problem:   Cholelithiasis with choledocholithiasis Active Problems:   Atrial fibrillation (HCC)   Essential hypertension   COPD (chronic obstructive pulmonary disease) (HCC)   On anticoagulant therapy   Abdominal pain   Hypokalemia   CKD (chronic kidney disease) stage 3, GFR 30-59 ml/min (HCC)   Abdominal pain with transaminitis,cholelithiasis, choledocholithiasisadynamic ileus. As previously noted patient presented with an obstructive pattern. MRCP was concerning for possible ruptured gallbladder and patient was taken to OR urgently, now status post cholecystectomy postop day #4. Intraoperative cholangiogram was concerning for partial filling defects. GI is following r with plan EUS Thursday and possible ERCP. We will continue Zosyn, monitor electrolytes and CBC. White blood cell count down to 72from 22.  Paroxysmal A. fib. Eliquis held preoperatively.  Will continue to hold Eliquis for possible ERCP, await GI input before restarting. Continue home medications otherwise. On SCD's  Hypertension patient's losartan hydrochlorothiazide were held preoperatively. We will continue beta-blocker and doxazosin. Will titrate medications back in as blood pressure dictates-HAS BEEN mildly elevated today likely secondary abdominal discomfort.  CKD  stage III patient's near baseline. Will avoid nephrotoxins continue to monitor electrolytes daily.  Hypokalemia: Repleted and normal, 4.2  Addendum: Pt had a BM THIS am BUT ALSO HAD SOME N/V , NOT ADVANCING DIET  Patient will remain inpatient for continued medical care of complex medical conditions to include -Biliary tree filling defect concerning for possible retained stone -Continued nausea and vomiting postoperatively requiring IV antibiotics - Continued IV fluid resuscitation and IV analgesics for postoperative pain control  DVT prophylaxis: SCD/Compression stockings  Code Status: full    Code Status Orders  (From admission, onward)         Start     Ordered   04/15/18 0809  Full code  Continuous     04/15/18 0808        Code Status History    This patient has a current code status but no historical code status.     Family Communication: none present  Disposition Plan:   Patient will remain inpatient as above.  Plan EUS Thursday possible discharge Thursday or Friday pending results and clinical improvement Consults called: None Admission status: Inpatient   Consultants:   GI, general surgery  Procedures:  Dg Abd 1 View  Result Date: 04/19/2018 CLINICAL DATA:  85 year old male with anterior abdominal pain. Status post cholecystectomy postoperative day 3. EXAM: ABDOMEN - 1 VIEW COMPARISON:  Radiographs 04/18/2018 and earlier. FINDINGS: Supine views. Stable cholecystectomy clips. Oral contrast which was in the right colon yesterday is now in the descending and visible sigmoid colon. Bowel gas pattern is within normal limits. No definite pneumoperitoneum on these supine views. Streaky left lung base opacity is stable. The right lung base appears clear. No acute osseous abnormality identified. IMPRESSION: 1. Normal bowel gas pattern with transit of oral contrast since yesterday into the descending  and sigmoid colon. 2. Continued streaky left lung base opacity.  Electronically Signed   By: Odessa Fleming M.D.   On: 04/19/2018 16:25   Dg Cholangiogram Operative  Result Date: 04/16/2018 CLINICAL DATA:  Intraoperative cholangiogram during laparoscopic cholecystectomy. EXAM: INTRAOPERATIVE CHOLANGIOGRAM FLUOROSCOPY TIME:  20 seconds COMPARISON:  MRCP-04/15/2018 FINDINGS: Intraoperative cholangiographic images of the right upper abdominal quadrant during laparoscopic cholecystectomy are provided for review. Surgical clips overlie the expected location of the gallbladder fossa. Contrast injection demonstrates selective cannulation of the central aspect of the cystic duct. There is passage of contrast through the central aspect of the cystic duct with filling of a mildly dilated common bile duct. There is passage of contrast though the CBD and into the descending portion of the duodenum. There is minimal reflux of injected contrast into the common hepatic duct and central aspect of the non dilated intrahepatic biliary system. There is a lenticular shaped nonocclusive filling defect within the distal aspect of the CBD which could be indicative of choledocholithiasis questioned on preceding MRCP. IMPRESSION: Lenticular shaped nonocclusive filling defect within the distal aspect of the CBD, potentially artifactual due to underdistention though could be indicative of nonocclusive choledocholithiasis questioned on preceding MRCP. Further evaluation and management with ERCP could be performed as indicated. Electronically Signed   By: Simonne Come M.D.   On: 04/16/2018 11:51   Ct Abdomen Pelvis W Contrast  Addendum Date: 04/15/2018   ADDENDUM REPORT: 04/15/2018 03:42 ADDENDUM: Additional clinical information, jaundice. The patient's CT images are reviewed. Extrahepatic bile duct is slightly enlarged, measuring up to 9 mm. Questionable density within the distal duct at the head of pancreas. Recommend correlation with LFTs. MRCP could be obtained if concern for ductal obstruction is given  history of jaundice Electronically Signed   By: [name] M.D.   On: 04/15/2018 03:42   Result Date: 04/15/2018 CLINICAL DATA:  Abdominal pain with nausea and vomiting EXAM: CT ABDOMEN AND PELVIS WITH CONTRAST TECHNIQUE: Multidetector CT imaging of the abdomen and pelvis was performed using the standard protocol following bolus administration of intravenous contrast. CONTRAST:  80mL OMNIPAQUE IOHEXOL 300 MG/ML  SOLN COMPARISON:  PET CT 01/08/2018, CT 12/30/2017 FINDINGS: Lower chest: Lung bases again demonstrate oval subpleural mass in the left lower lobe, this measures 4.1 x 2.2 cm and appears slightly increased in size. No significant pleural effusion. Small hiatal hernia. Hepatobiliary: Cyst at the dome of the liver. Calcified stone at the neck of the gallbladder. Suspected edema or fluid adjacent to the gallbladder fundus. No biliary dilatation Pancreas: Unremarkable. No pancreatic ductal dilatation or surrounding inflammatory changes. Spleen: Normal in size without focal abnormality. Adrenals/Urinary Tract: Adrenal glands are unremarkable. Kidneys are normal, without renal calculi, focal lesion, or hydronephrosis. Bladder is unremarkable. Stomach/Bowel: The stomach is nonenlarged. No dilated small bowel. No colon wall thickening. Negative appendix. Colon diverticular disease without acute inflammatory change. Vascular/Lymphatic: Moderate aortic atherosclerosis. No aneurysm. No significantly enlarged lymph nodes. Reproductive: Enlarged heterogeneous prostate with calcification Other: Negative for free air or free fluid Musculoskeletal: Multiple level degenerative changes of the spine. Chronic compression deformity L1. IMPRESSION: 1. 2.3 cm calcified stone at the gallbladder neck. Suspected gallbladder wall edema versus small amount of pericholecystic fluid. Suggest correlation with ultrasound. 2. Slight interval increase in size of subpleural left lower lobe lung mass now measuring 4.1 cm. 3. Diverticular  disease of the colon without acute inflammatory change Electronically Signed: By: [name] M.D. On: 04/15/2018 02:38   Mr Abdomen Mrcp Wo Contrast  Result Date: 04/16/2018 CLINICAL DATA:  85 year old male with history of abdominal pain, concerning for an acute cholecystitis. Additional history of paroxysmal atrial fibrillation and atrial flutter on Eliquis. Hypertension. COPD. EXAM: MRI ABDOMEN WITHOUT CONTRAST  (INCLUDING MRCP) TECHNIQUE: Multiplanar multisequence MR imaging of the abdomen was performed. Heavily T2-weighted images of the biliary and pancreatic ducts were obtained, and three-dimensional MRCP images were rendered by post processing. COMPARISON:  No priors. FINDINGS: Comment: Today's study is limited for detection and characterization of visceral and/or vascular lesions by lack of IV gadolinium. Lower chest: Unremarkable. Hepatobiliary: Heterogeneous loss of signal intensity throughout the hepatic parenchyma on out of phase dual echo images, indicative of hepatic steatosis. Several small T1 hypointense, T2 hyperintense lesions are noted in the liver, incompletely characterized on today's noncontrast examination, but presumably small cysts and/or biliary hamartomas, largest of which has some thin internal septations and measures up to 12 mm in segment 4A. MRCP images demonstrate very mild intrahepatic biliary ductal dilatation. Common bile duct is also mildly dilated measuring 8 mm in the porta hepatis. In the distal common bile duct there is a 4 mm filling defect (coronal image 25 of series 12), compatible with choledocholithiasis. There may be another filling defect immediately before the ampulla, however, this is difficult to judge secondary to motion. In addition there is a large signal void in the neck of the gallbladder measuring 2.3 x 1.5 cm, compatible with a gallstone. Some amorphous material lies dependently in the gallbladder and is intermediate intensity on T1 weighted images and  slightly low signal intensity on T2 weighted images, compatible with biliary sludge. Gallbladder wall is diffusely thickened and edematous. There is an abnormal appearance of the anteromedial aspect of the wall which is irregular in shape, which may simply reflect a fold, however, the possibility of pending gallbladder perforation in this region is not excluded (best appreciated on axial image 23 of series 10 and 23 of series 7). Increased T2 signal intensity surrounding the gallbladder, indicative of acute inflammation. Pancreas: No pancreatic mass. No pancreatic ductal dilatation. No pancreatic or peripancreatic fluid or inflammatory changes. Spleen:  Unremarkable. Adrenals/Urinary Tract: Small amount of T2 signal intensity surrounding both kidneys, nonspecific. Unenhanced appearance of the kidneys and bilateral adrenal glands is otherwise unremarkable. Stomach/Bowel: Visualized portions are unremarkable. Vascular/Lymphatic: Aortic atherosclerosis. No aneurysm noted in the abdominal vasculature. Other: Trace amount of pericholecystic fluid. No significant volume of ascites noted in the visualized portions of the peritoneal cavity. Musculoskeletal: No aggressive appearing osseous lesions are noted in the visualized portions of the skeleton. IMPRESSION: 1. Cholelithiasis with evidence of acute cholecystitis. Irregularity of the anteromedial wall of the gallbladder may simply reflect a gallbladder fold, however, the possibility of pending gallbladder perforation is suspected and surgical evaluation is strongly recommended. 2. Choledocholithiasis. Mild intra and extrahepatic biliary ductal dilatation. 3. Heterogeneous hepatic steatosis. 4. Aortic atherosclerosis. These results will be called to the ordering clinician or representative by the Radiologist Assistant, and communication documented in the PACS or zVision Dashboard. Electronically Signed   By: Trudie Reed M.D.   On: 04/16/2018 08:44   Mr 3d Recon At  Scanner  Result Date: 04/16/2018 CLINICAL DATA:  85 year old male with history of abdominal pain, concerning for an acute cholecystitis. Additional history of paroxysmal atrial fibrillation and atrial flutter on Eliquis. Hypertension. COPD. EXAM: MRI ABDOMEN WITHOUT CONTRAST  (INCLUDING MRCP) TECHNIQUE: Multiplanar multisequence MR imaging of the abdomen was performed. Heavily T2-weighted images of the biliary and pancreatic ducts were obtained, and three-dimensional MRCP  images were rendered by post processing. COMPARISON:  No priors. FINDINGS: Comment: Today's study is limited for detection and characterization of visceral and/or vascular lesions by lack of IV gadolinium. Lower chest: Unremarkable. Hepatobiliary: Heterogeneous loss of signal intensity throughout the hepatic parenchyma on out of phase dual echo images, indicative of hepatic steatosis. Several small T1 hypointense, T2 hyperintense lesions are noted in the liver, incompletely characterized on today's noncontrast examination, but presumably small cysts and/or biliary hamartomas, largest of which has some thin internal septations and measures up to 12 mm in segment 4A. MRCP images demonstrate very mild intrahepatic biliary ductal dilatation. Common bile duct is also mildly dilated measuring 8 mm in the porta hepatis. In the distal common bile duct there is a 4 mm filling defect (coronal image 25 of series 12), compatible with choledocholithiasis. There may be another filling defect immediately before the ampulla, however, this is difficult to judge secondary to motion. In addition there is a large signal void in the neck of the gallbladder measuring 2.3 x 1.5 cm, compatible with a gallstone. Some amorphous material lies dependently in the gallbladder and is intermediate intensity on T1 weighted images and slightly low signal intensity on T2 weighted images, compatible with biliary sludge. Gallbladder wall is diffusely thickened and edematous. There is  an abnormal appearance of the anteromedial aspect of the wall which is irregular in shape, which may simply reflect a fold, however, the possibility of pending gallbladder perforation in this region is not excluded (best appreciated on axial image 23 of series 10 and 23 of series 7). Increased T2 signal intensity surrounding the gallbladder, indicative of acute inflammation. Pancreas: No pancreatic mass. No pancreatic ductal dilatation. No pancreatic or peripancreatic fluid or inflammatory changes. Spleen:  Unremarkable. Adrenals/Urinary Tract: Small amount of T2 signal intensity surrounding both kidneys, nonspecific. Unenhanced appearance of the kidneys and bilateral adrenal glands is otherwise unremarkable. Stomach/Bowel: Visualized portions are unremarkable. Vascular/Lymphatic: Aortic atherosclerosis. No aneurysm noted in the abdominal vasculature. Other: Trace amount of pericholecystic fluid. No significant volume of ascites noted in the visualized portions of the peritoneal cavity. Musculoskeletal: No aggressive appearing osseous lesions are noted in the visualized portions of the skeleton. IMPRESSION: 1. Cholelithiasis with evidence of acute cholecystitis. Irregularity of the anteromedial wall of the gallbladder may simply reflect a gallbladder fold, however, the possibility of pending gallbladder perforation is suspected and surgical evaluation is strongly recommended. 2. Choledocholithiasis. Mild intra and extrahepatic biliary ductal dilatation. 3. Heterogeneous hepatic steatosis. 4. Aortic atherosclerosis. These results will be called to the ordering clinician or representative by the Radiologist Assistant, and communication documented in the PACS or zVision Dashboard. Electronically Signed   By: Trudie Reed M.D.   On: 04/16/2018 08:44   US Abdomen Limited  Result Date: 04/15/2018 CLINICAL DATA:  Jaundice EXAM: ULTRASOUND ABDOMEN LIMITED RIGHT UPPER QUADRANT COMPARISON:  CT 04/15/2018 FINDINGS:  Gallbladder: Shadowing stone within the gallbladder neck measuring 1.9 cm. Increased wall thickness of 8.8 mm with trace pericholecystic fluid. Negative sonographic Murphy. Small amount of intraluminal sludge. Common bile duct: Diameter: 4.9 mm Liver: Heterogeneous echotexture without focal hepatic abnormality. Portal vein is patent on color Doppler imaging with normal direction of blood flow towards the liver. IMPRESSION: 1. Gallstone with increased wall thickness and small amount of pericholecystic fluid, raising concern for cholecystitis despite negative sonographic Murphy. Electronically Signed   By: [name] M.D.   On: 04/15/2018 03:39   Dg Abd Portable 1v  Result Date: 04/18/2018 CLINICAL DATA:  Abdominal pain for  several days EXAM: PORTABLE ABDOMEN - 1 VIEW COMPARISON:  04/17/2018 FINDINGS: Scattered large and small bowel gas is noted. Previously seen contrast is again noted within the colon. Mildly prominent loops of small bowel are again noted and stable from the previous exam. No free air is noted. No other focal abnormality is seen. IMPRESSION: Bowel-gas pattern stable from the prior exam consistent with small bowel ileus. Electronically Signed   By: Alcide Clever M.D.   On: 04/18/2018 10:23   Dg Abd Portable 1v  Result Date: 04/17/2018 CLINICAL DATA:  Ileus. EXAM: PORTABLE ABDOMEN - 1 VIEW COMPARISON:  04/15/2018 FINDINGS: Two supine views of the abdomen and pelvis. Gas-filled small bowel loops of up to 2.6 cm. Gas and contrast within normal caliber colon. No gross free intraperitoneal air. IMPRESSION: Upper normal caliber gas-filled loops of small bowel, favoring mild adynamic ileus. Electronically Signed   By: Jeronimo Greaves M.D.   On: 04/17/2018 13:07     Antimicrobials:   Zosyn day #6 of 7   Subjective: No acute events overnight.  Patient reports some abdominal pain but subsequently has had a BM as noted by addendum.  Objective: Vitals:   04/19/18 1513 04/19/18 2058 04/20/18  0457 04/20/18 0943  BP: (!) 155/71 140/64 135/61 (!) 157/67  Pulse: 75 63 (!) 59 60  Resp: 18 16 16 18   Temp: 98.2 F (36.8 C) 98.8 F (37.1 C) 98.9 F (37.2 C) 98.2 F (36.8 C)  TempSrc: Oral Oral Oral Oral  SpO2: 97% 96% 94% 98%  Weight:      Height:        Intake/Output Summary (Last 24 hours) at 04/20/2018 1104 Last data filed at 04/20/2018 0400 Gross per 24 hour  Intake 1260.87 ml  Output 250 ml  Net 1010.87 ml   Filed Weights   04/16/18 0955  Weight: 72.6 kg    Examination:  General exam: Appears calm and comfortable  Respiratory system: Clear to auscultation. Respiratory effort normal. Cardiovascular system: S1 & S2 heard, RRR. No JVD, murmurs, rubs, gallops or clicks. No pedal edema. Gastrointestinal system: Abdomen is nondistended, soft but still mildly tender. No organomegaly or masses felt. Normal bowel sounds heard. Central nervous system: Alert and oriented. No focal neurological deficits. Extremities: Symmetric 5 x 5 power.  No edema moves all 4 extremities freely Skin: No rashes, lesions or ulcers Psychiatry: Judgement and insight appear normal. Mood & affect appropriate.     Data Reviewed: I have personally reviewed following labs and imaging studies  CBC: Recent Labs  Lab 04/16/18 0203 04/17/18 0326 04/18/18 0748 04/19/18 0410 04/20/18 0336  WBC 21.8* 13.2* 10.4 9.3 10.0  HGB 11.9* 10.0* 10.2* 10.4* 10.1*  HCT 34.3* 30.1* 30.5* 32.3* 30.6*  MCV 89.1 91.8 93.6 94.4 93.9  PLT 189 156 176 193 209   Basic Metabolic Panel: Recent Labs  Lab 04/16/18 0203 04/17/18 0326 04/18/18 0748 04/19/18 0410 04/20/18 0336  NA 135 136 139 141 138  K 3.2* 3.8 4.3 4.2 4.2  CL 105 107 113* 113* 111  CO2 21* 22 20* 22 20*  GLUCOSE 135* 155* 129* 125* 126*  BUN 19 25* 24* 19 14  CREATININE 1.52* 1.59* 1.57* 1.56* 1.43*  CALCIUM 7.8* 7.5* 7.9* 7.9* 8.1*  MG 1.6*  --  2.0  --   --    GFR: Estimated Creatinine Clearance: 31.4 mL/min (A) (by C-G formula  based on SCr of 1.43 mg/dL (H)). Liver Function Tests: Recent Labs  Lab 04/16/18 0203 04/17/18 0326  04/18/18 0748 04/19/18 0410 04/20/18 0336  AST 66* 75* 61* 45* 42*  ALT 155* 124* 109* 93* 81*  ALKPHOS 96 89 153* 165* 179*  BILITOT 2.0* 1.9* 1.6* 1.6* 1.6*  PROT 6.1* 5.4* 5.7* 5.8* 5.6*  ALBUMIN 2.9* 2.2* 2.4* 2.3* 2.2*   Recent Labs  Lab 04/14/18 1919 04/16/18 0203  LIPASE 23 18   No results for input(s): AMMONIA in the last 168 hours. Coagulation Profile: No results for input(s): INR, PROTIME in the last 168 hours. Cardiac Enzymes: No results for input(s): CKTOTAL, CKMB, CKMBINDEX, TROPONINI in the last 168 hours. BNP (last 3 results) No results for input(s): PROBNP in the last 8760 hours. HbA1C: No results for input(s): HGBA1C in the last 72 hours. CBG: No results for input(s): GLUCAP in the last 168 hours. Lipid Profile: No results for input(s): CHOL, HDL, LDLCALC, TRIG, CHOLHDL, LDLDIRECT in the last 72 hours. Thyroid Function Tests: No results for input(s): TSH, T4TOTAL, FREET4, T3FREE, THYROIDAB in the last 72 hours. Anemia Panel: No results for input(s): VITAMINB12, FOLATE, FERRITIN, TIBC, IRON, RETICCTPCT in the last 72 hours. Sepsis Labs: No results for input(s): PROCALCITON, LATICACIDVEN in the last 168 hours.  Recent Results (from the past 240 hour(s))  Culture, Urine     Status: None   Collection Time: 04/16/18  8:50 AM  Result Value Ref Range Status   Specimen Description URINE, CLEAN CATCH  Final   Special Requests NONE  Final   Culture   Final    NO GROWTH Performed at [facility], [address] [place], [place] [address]    Report Status 04/17/2018 FINAL  Final  Surgical pcr screen     Status: None   Collection Time: 04/16/18  9:35 AM  Result Value Ref Range Status   MRSA, PCR NEGATIVE NEGATIVE Final   Staphylococcus aureus NEGATIVE NEGATIVE Final    Comment: (NOTE) The Xpert SA Assay (FDA approved for NASAL specimens in patients  2 years of age and older), is one component of a comprehensive surveillance program. It is not intended to diagnose infection nor to guide or monitor treatment. Performed at [facility], [address], [place], [place] [address]          Radiology Studies: Dg Abd 1 View  Result Date: 04/19/2018 CLINICAL DATA:  85 year old male with anterior abdominal pain. Status post cholecystectomy postoperative day 3. EXAM: ABDOMEN - 1 VIEW COMPARISON:  Radiographs 04/18/2018 and earlier. FINDINGS: Supine views. Stable cholecystectomy clips. Oral contrast which was in the right colon yesterday is now in the descending and visible sigmoid colon. Bowel gas pattern is within normal limits. No definite pneumoperitoneum on these supine views. Streaky left lung base opacity is stable. The right lung base appears clear. No acute osseous abnormality identified. IMPRESSION: 1. Normal bowel gas pattern with transit of oral contrast since yesterday into the descending and sigmoid colon. 2. Continued streaky left lung base opacity. Electronically Signed   By: Odessa Fleming M.D.   On: 04/19/2018 16:25        Scheduled Meds: . amLODipine  2.5 mg Oral Daily  . docusate sodium  100 mg Oral BID  . doxazosin  4 mg Oral Daily  . metoprolol succinate  25 mg Oral Daily  . multivitamin  1 tablet Oral Daily  . polyethylene glycol  17 g Oral Daily  . sodium chloride flush  3 mL Intravenous Once  . vitamin C  1,000 mg Oral Daily   Continuous Infusions: .  dextrose 5 % and 0.9 % NaCl with KCl 40 mEq/L 75 mL/hr at 04/20/18 0555  . lactated ringers Stopped (04/16/18 1211)  . magnesium sulfate 1 - 4 g bolus IVPB    . piperacillin-tazobactam (ZOSYN)  IV 3.375 g (04/20/18 0557)     LOS: 5 days    Time spent: 35 min     Burke Keels, MD [facility]  If 7PM-7AM, please contact night-coverage  04/20/2018, 11:04 AM

## 2018-04-20 NOTE — Progress Notes (Signed)
Rodman Key 4:17 PM  Subjective: Patient feeling a little better compared to yesterday and does want to try to eat pancakes which we discussed and we also rediscussed his EUS on Thursday and possible ERCP pending those findings and he has no new complaints  Objective: Vital signs stable afebrile no acute distress abdomen with minimal soreness occasional bowel sounds no rebound labs and x-ray okay LFTs essentially unchanged  Assessment: Questionable CBD stone  Plan: Will advance diet tomorrow and let him try his pancakes and plan on EUS possible ERCP Thursday a.m.  [facility] E  Pager [phone] After 5PM or if no answer call [phone]

## 2018-04-21 ENCOUNTER — Encounter (HOSPITAL_COMMUNITY): Payer: Self-pay | Admitting: Anesthesiology

## 2018-04-21 DIAGNOSIS — K8000 Calculus of gallbladder with acute cholecystitis without obstruction: Secondary | ICD-10-CM

## 2018-04-21 LAB — MAGNESIUM: Magnesium: 1.9 mg/dL (ref 1.7–2.4)

## 2018-04-21 NOTE — Progress Notes (Signed)
Vincent Barrett 10:36 AM  Subjective: Patient ate some of the pancakes does not want anything heavier and did move his bowels today which helped he has no new complaints  Objective: Signs stable afebrile lungs are clear regular rate and rhythm abdomen is softer decreased tenderness no new labs except magnesium okay  Assessment: Questionable CBD stone  Plan: Okay to proceed with EUS and possible ERCP tomorrow morning and that procedure was again discussed with the patient and will repeat labs tomorrow  [facility]  Pager [phone] After 5PM or if no answer call [phone]

## 2018-04-21 NOTE — Progress Notes (Signed)
Ordered breakfast for patient. Pancakes ordered per GI to see if patient could tolerate diet.

## 2018-04-21 NOTE — Progress Notes (Signed)
Patient ID: Vincent Barrett, male   DOB: Feb 23, 1925, 85 y.o.   MRN: [id number]  PROGRESS NOTE    Vincent Barrett  BZJ:[id number] DOB: 1925-04-22 DOA: 04/14/2018 PCP: [name], MD   Brief Narrative:  85 year old male with history of hypertension, A. fib on Eliquis, chronic renal disease stage III, BPH presented with abdominal pain.  MRCP was concerning for acute calculus cholecystitis; patient was taken to the OR urgently and underwent lap cholecystectomy on 04/16/2018.  Intraoperative cholangiogram was concerning for partial filling defects and GI is following for planned EUS and possible ERCP on Thursday.  Assessment & Plan:   Principal Problem:   Cholelithiasis with choledocholithiasis Active Problems:   Atrial fibrillation (HCC)   Essential hypertension   COPD (chronic obstructive pulmonary disease) (HCC)   On anticoagulant therapy   Abdominal pain   Hypokalemia   CKD (chronic kidney disease) stage 3, GFR 30-59 ml/min (HCC)   Acute calculus cholecystitis -Status post laparoscopic cholecystectomy on 04/16/2018. -Wound care as per general surgery recommendations.  General surgery has signed off.  Outpatient follow-up with general surgery  Questionable CBD stone with elevated LFTs -GI following.  Plan for EUS and possible ERCP tomorrow.  We will keep n.p.o. past midnight.  Repeat a.m. LFTs  Paroxysmal A. fib -Eliquis on hold for now.  Resume Eliquis after GI intervention is done  Hypertension -Monitor blood pressure.  Continue amlodipine, metoprolol and doxazosin.  Losartan and hydrochlorothiazide have been on hold.  CKD stage III -Creatinine stable.  Monitor   DVT prophylaxis: SCDs Code Status: Full Family Communication: None at bedside Disposition Plan: Home in 1 to 2 days once cleared by GI  Consultants: General surgery/GI  Procedures: Laparoscopic cholecystectomy on 04/16/2018  Antimicrobials: Zosyn from 04/16/2018 onwards   Subjective: Patient seen and examined at bedside.   He denies worsening abdominal pain, nausea or vomiting.  No overnight fever.  Objective: Vitals:   04/21/18 0139 04/21/18 0512 04/21/18 0649 04/21/18 0846  BP:  (!) 164/66    Pulse:  61    Resp: (!) 28 16    Temp:  98 F (36.7 C)    TempSrc:  Oral    SpO2:  98% 98% 97%  Weight:      Height:        Intake/Output Summary (Last 24 hours) at 04/21/2018 1146 Last data filed at 04/21/2018 1000 Gross per 24 hour  Intake 2097.79 ml  Output 645 ml  Net 1452.79 ml   Filed Weights   04/16/18 0955  Weight: 72.6 kg    Examination:  General exam: Appears calm and comfortable.  Elderly male lying in bed.  No distress Respiratory system: Bilateral decreased breath sounds at bases, scattered crackles Cardiovascular system: S1 & S2 heard, Rate controlled Gastrointestinal system: Abdomen is nondistended, soft and mildly tender in the right upper quadrant. Normal bowel sounds heard. Extremities: No cyanosis, clubbing, edema    Data Reviewed: I have personally reviewed following labs and imaging studies  CBC: Recent Labs  Lab 04/16/18 0203 04/17/18 0326 04/18/18 0748 04/19/18 0410 04/20/18 0336  WBC 21.8* 13.2* 10.4 9.3 10.0  HGB 11.9* 10.0* 10.2* 10.4* 10.1*  HCT 34.3* 30.1* 30.5* 32.3* 30.6*  MCV 89.1 91.8 93.6 94.4 93.9  PLT 189 156 176 193 209   Basic Metabolic Panel: Recent Labs  Lab 04/16/18 0203 04/17/18 0326 04/18/18 0748 04/19/18 0410 04/20/18 0336 04/21/18 0329  NA 135 136 139 141 138  --   K 3.2* 3.8 4.3 4.2 4.2  --  CL 105 107 113* 113* 111  --   CO2 21* 22 20* 22 20*  --   GLUCOSE 135* 155* 129* 125* 126*  --   BUN 19 25* 24* 19 14  --   CREATININE 1.52* 1.59* 1.57* 1.56* 1.43*  --   CALCIUM 7.8* 7.5* 7.9* 7.9* 8.1*  --   MG 1.6*  --  2.0  --  1.7 1.9   GFR: Estimated Creatinine Clearance: 31.4 mL/min (A) (by C-G formula based on SCr of 1.43 mg/dL (H)). Liver Function Tests: Recent Labs  Lab 04/16/18 0203 04/17/18 0326 04/18/18 0748 04/19/18 0410  04/20/18 0336  AST 66* 75* 61* 45* 42*  ALT 155* 124* 109* 93* 81*  ALKPHOS 96 89 153* 165* 179*  BILITOT 2.0* 1.9* 1.6* 1.6* 1.6*  PROT 6.1* 5.4* 5.7* 5.8* 5.6*  ALBUMIN 2.9* 2.2* 2.4* 2.3* 2.2*   Recent Labs  Lab 04/14/18 1919 04/16/18 0203  LIPASE 23 18   No results for input(s): AMMONIA in the last 168 hours. Coagulation Profile: No results for input(s): INR, PROTIME in the last 168 hours. Cardiac Enzymes: No results for input(s): CKTOTAL, CKMB, CKMBINDEX, TROPONINI in the last 168 hours. BNP (last 3 results) No results for input(s): PROBNP in the last 8760 hours. HbA1C: No results for input(s): HGBA1C in the last 72 hours. CBG: No results for input(s): GLUCAP in the last 168 hours. Lipid Profile: No results for input(s): CHOL, HDL, LDLCALC, TRIG, CHOLHDL, LDLDIRECT in the last 72 hours. Thyroid Function Tests: No results for input(s): TSH, T4TOTAL, FREET4, T3FREE, THYROIDAB in the last 72 hours. Anemia Panel: No results for input(s): VITAMINB12, FOLATE, FERRITIN, TIBC, IRON, RETICCTPCT in the last 72 hours. Sepsis Labs: No results for input(s): PROCALCITON, LATICACIDVEN in the last 168 hours.  Recent Results (from the past 240 hour(s))  Culture, Urine     Status: None   Collection Time: 04/16/18  8:50 AM  Result Value Ref Range Status   Specimen Description URINE, CLEAN CATCH  Final   Special Requests NONE  Final   Culture   Final    NO GROWTH Performed at [facility], [address] [place], [place] [address]    Report Status 04/17/2018 FINAL  Final  Surgical pcr screen     Status: None   Collection Time: 04/16/18  9:35 AM  Result Value Ref Range Status   MRSA, PCR NEGATIVE NEGATIVE Final   Staphylococcus aureus NEGATIVE NEGATIVE Final    Comment: (NOTE) The Xpert SA Assay (FDA approved for NASAL specimens in patients 59 years of age and older), is one component of a comprehensive surveillance program. It is not intended to diagnose infection nor to  guide or monitor treatment. Performed at [facility], [address], [place], [place] [address]          Radiology Studies: Dg Abd 1 View  Result Date: 04/19/2018 CLINICAL DATA:  85 year old male with anterior abdominal pain. Status post cholecystectomy postoperative day 3. EXAM: ABDOMEN - 1 VIEW COMPARISON:  Radiographs 04/18/2018 and earlier. FINDINGS: Supine views. Stable cholecystectomy clips. Oral contrast which was in the right colon yesterday is now in the descending and visible sigmoid colon. Bowel gas pattern is within normal limits. No definite pneumoperitoneum on these supine views. Streaky left lung base opacity is stable. The right lung base appears clear. No acute osseous abnormality identified. IMPRESSION: 1. Normal bowel gas pattern with transit of oral contrast since yesterday into the descending and sigmoid colon. 2.  Continued streaky left lung base opacity. Electronically Signed   By: Odessa Fleming M.D.   On: 04/19/2018 16:25        Scheduled Meds: . amLODipine  2.5 mg Oral Daily  . docusate sodium  100 mg Oral BID  . doxazosin  4 mg Oral Daily  . metoprolol succinate  25 mg Oral Daily  . multivitamin  1 tablet Oral Daily  . polyethylene glycol  17 g Oral Daily  . sodium chloride flush  3 mL Intravenous Once  . vitamin C  1,000 mg Oral Daily   Continuous Infusions: . dextrose 5 % and 0.9 % NaCl with KCl 40 mEq/L 75 mL/hr at 04/20/18 2338  . lactated ringers Stopped (04/16/18 1211)  . magnesium sulfate 1 - 4 g bolus IVPB    . piperacillin-tazobactam (ZOSYN)  IV 3.375 g (04/21/18 0641)     LOS: 6 days        Glade Lloyd, MD [facility] 04/21/2018, 11:46 AM

## 2018-04-21 NOTE — Anesthesia Preprocedure Evaluation (Addendum)
Anesthesia Evaluation  Patient identified by MRN, date of birth, ID band Patient awake    Reviewed: Allergy & Precautions, NPO status , Patient's Chart, lab work & pertinent test results, reviewed documented beta blocker date and time   Airway Mallampati: I  TM Distance: >3 FB Neck ROM: Full    Dental  (+) Edentulous Upper, Edentulous Lower   Pulmonary COPD,  COPD inhaler, former smoker,    Pulmonary exam normal breath sounds clear to auscultation       Cardiovascular hypertension, Pt. on medications and Pt. on home beta blockers Normal cardiovascular exam+ dysrhythmias Atrial Fibrillation  Rhythm:Irregular Rate:Tachycardia     Neuro/Psych negative neurological ROS  negative psych ROS   GI/Hepatic Neg liver ROS, Choledocholithiasis   Endo/Other  negative endocrine ROS  Renal/GU Renal InsufficiencyRenal disease  negative genitourinary   Musculoskeletal  (+) Arthritis , Osteoarthritis,    Abdominal   Peds  Hematology  (+) anemia , Eliquis therapy   Anesthesia Other Findings   Reproductive/Obstetrics                            Anesthesia Physical Anesthesia Plan  ASA: III  Anesthesia Plan: General   Post-op Pain Management:    Induction: Intravenous  PONV Risk Score and Plan: 3  Airway Management Planned: Oral ETT  Additional Equipment:   Intra-op Plan:   Post-operative Plan: Extubation in OR  Informed Consent: I have reviewed the patients History and Physical, chart, labs and discussed the procedure including the risks, benefits and alternatives for the proposed anesthesia with the patient or authorized representative who has indicated his/her understanding and acceptance.     Dental advisory given  Plan Discussed with: CRNA and Surgeon  Anesthesia Plan Comments:        Anesthesia Quick Evaluation

## 2018-04-21 NOTE — Final Consult Note (Signed)
Consultant Final Sign-Off Note    Assessment/Final recommendations  Vincent Barrett is a 85 y.o. male followed by me for choledocholithiasis s/p lap chole 3/6 by Dr. Corliss Skains.   Surgically stable - incisions c/d/i, LFTs and Tbili stable. GI to perform EUS possible ERCP tomorrow.    Wound care (if applicable): bathe daily and allow soap and water to wash over incisions   Diet at discharge: per primary team   Activity at discharge: per primary team   Follow-up appointment:  Will schedule for [facility] office in 2 weeks    Pending results:  Unresulted Labs (From admission, onward)   None       Medication recommendations: None   Other recommendations: Advance diet slowly as tolerated    Thank you for allowing [place] to participate in the care of your patient!  Please consult [place] again if you have further needs for your patient.  Tresa Endo Rayburn 04/21/2018 10:06 AM    Subjective   Patient reports abdomen is sore but stable. Patient reports some nausea with eating but is having bowel function. Wants to go home.   Objective  Vital signs in last 24 hours: Temp:  [98 F (36.7 C)] 98 F (36.7 C) (03/11 0512) Pulse Rate:  [60-61] 61 (03/11 0512) Resp:  [16-28] 16 (03/11 0512) BP: (158-164)/(66-75) 164/66 (03/11 0512) SpO2:  [97 %-98 %] 97 % (03/11 0846)  PE: Gen: Awake and alert, NAD Heart: Regular Lungs; Normal effort Abd: Soft, mild distension, non-tender, Steristrips in place underneath, c/d/i.   Pertinent labs and Studies: Recent Labs    04/19/18 0410 04/20/18 0336  WBC 9.3 10.0  HGB 10.4* 10.1*  HCT 32.3* 30.6*   BMET Recent Labs    04/19/18 0410 04/20/18 0336  NA 141 138  K 4.2 4.2  CL 113* 111  CO2 22 20*  GLUCOSE 125* 126*  BUN 19 14  CREATININE 1.56* 1.43*  CALCIUM 7.9* 8.1*   No results for input(s): LABURIN in the last 72 hours. Results for orders placed or performed during the hospital encounter of 04/14/18  Culture, Urine     Status: None   Collection  Time: 04/16/18  8:50 AM  Result Value Ref Range Status   Specimen Description URINE, CLEAN CATCH  Final   Special Requests NONE  Final   Culture   Final    NO GROWTH Performed at [facility], [address] [place], [place] [address]    Report Status 04/17/2018 FINAL  Final  Surgical pcr screen     Status: None   Collection Time: 04/16/18  9:35 AM  Result Value Ref Range Status   MRSA, PCR NEGATIVE NEGATIVE Final   Staphylococcus aureus NEGATIVE NEGATIVE Final    Comment: (NOTE) The Xpert SA Assay (FDA approved for NASAL specimens in patients 16 years of age and older), is one component of a comprehensive surveillance program. It is not intended to diagnose infection nor to guide or monitor treatment. Performed at [facility], [address] [place], [place] [address]     Imaging: No results found.

## 2018-04-21 NOTE — Evaluation (Signed)
Physical Therapy Evaluation Patient Details Name: Vincent Barrett MRN: [id number] DOB: May 31, 1925 Today's Date: 04/21/2018   History of Present Illness  85 yo male with onset of cholelithiasis and choledocholithiasis was admitted with nausea, clear abd imaging.  PMHx:  COPD, HTN, CKD3, cardioversion Feb 2019, OA, TEE 2013, PAF  Clinical Impression  Pt was able to be seen today, was up with nursing on RW in his room but tires with a great deal of ease due to recent limited mobility.  He is appropriate for short course of HHPT and then to consider outpatient as he is accustomed to a good amount of independent mobility.  Follow acutely to increase control of standing balance and gait quality to decrease fall risk at home.    Follow Up Recommendations Home health PT;Supervision for mobility/OOB    Equipment Recommendations  Rolling walker with 5" wheels    Recommendations for Other Services       Precautions / Restrictions Precautions Precautions: Fall Restrictions Weight Bearing Restrictions: No      Mobility  Bed Mobility Overal bed mobility: Modified Independent             General bed mobility comments: pt able to get into bed with no assist but using bedrail  Transfers Overall transfer level: Needs assistance Equipment used: Rolling walker (2 wheeled) Transfers: Sit to/from Stand Sit to Stand: Min guard         General transfer comment: using bedrail to control descent  Ambulation/Gait Ambulation/Gait assistance: Min guard Gait Distance (Feet): 40 Feet Assistive device: Rolling walker (2 wheeled) Gait Pattern/deviations: Step-through pattern;Decreased stride length;Narrow base of support Gait velocity: reduced Gait velocity interpretation: <1.31 ft/sec, indicative of household Insurance account manager [employer] (Stroke Patients Only)       Balance Overall balance assessment: Needs assistance Sitting-balance  support: Feet supported Sitting balance-Leahy Scale: Good     Standing balance support: Bilateral upper extremity supported Standing balance-Leahy Scale: Fair                               Pertinent Vitals/Pain Pain Assessment: No/denies pain    Home Living Family/patient expects to be discharged to:: Private residence Living Arrangements: Spouse/significant other Available Help at Discharge: Family;Available 24 hours/day Type of Home: House Home Access: Level entry     Home Layout: One level Home Equipment: Cane - single point Additional Comments: pt reports he only used SPC at home    Prior Function Level of Independence: Independent with assistive device(s)               Hand Dominance   Dominant Hand: Right    Extremity/Trunk Assessment   Upper Extremity Assessment Upper Extremity Assessment: Overall WFL for tasks assessed    Lower Extremity Assessment Lower Extremity Assessment: Overall WFL for tasks assessed    Cervical / Trunk Assessment Cervical / Trunk Assessment: Normal  Communication   Communication: No difficulties  Cognition Arousal/Alertness: Awake/alert Behavior During Therapy: WFL for tasks assessed/performed Overall Cognitive Status: Within Functional Limits for tasks assessed                                        General Comments General comments (skin integrity, edema, etc.): mobility is tiring for pt  but he has a cardiovascular history    Exercises     Assessment/Plan    PT Assessment Patient needs continued PT services  PT Problem List Decreased range of motion;Decreased activity tolerance;Decreased balance;Decreased mobility;Decreased coordination;Decreased knowledge of use of DME;Decreased safety awareness;Cardiopulmonary status limiting activity       PT Treatment Interventions DME instruction;Gait training;Functional mobility training;Therapeutic activities;Neuromuscular re-education;Balance  training;Therapeutic exercise;Patient/family education    PT Goals (Current goals can be found in the Care Plan section)  Acute Rehab PT Goals Patient Stated Goal: to get home and feel better PT Goal Formulation: With patient Time For Goal Achievement: 05/05/18 Potential to Achieve Goals: Good    Frequency Min 3X/week   Barriers to discharge Decreased caregiver support wife is able to help to some degree    Co-evaluation               AM-PAC PT "6 Clicks" Mobility  Outcome Measure Help needed turning from your back to your side while in a flat bed without using bedrails?: None Help needed moving from lying on your back to sitting on the side of a flat bed without using bedrails?: A Little Help needed moving to and from a bed to a chair (including a wheelchair)?: A Little Help needed standing up from a chair using your arms (e.g., wheelchair or bedside chair)?: A Little Help needed to walk in hospital room?: A Little Help needed climbing 3-5 steps with a railing? : A Lot 6 Click Score: 18    End of Session Equipment Utilized During Treatment: Gait belt Activity Tolerance: Patient limited by fatigue Patient left: in bed;with call bell/phone within reach;with bed alarm set Nurse Communication: Mobility status PT Visit Diagnosis: Unsteadiness on feet (R26.81);Other abnormalities of gait and mobility (R26.89);Difficulty in walking, not elsewhere classified (R26.2)    Time: 4332-9518 PT Time Calculation (min) (ACUTE ONLY): 14 min   Charges:   PT Evaluation $PT Eval Moderate Complexity: 1 Mod         Ivar Drape 04/21/2018, 9:45 PM  Samul Dada, PT MS Acute Rehab Dept. Number: [facility] [id number] and [facility] [phone]

## 2018-04-22 ENCOUNTER — Encounter (HOSPITAL_COMMUNITY): Payer: Self-pay

## 2018-04-22 ENCOUNTER — Inpatient Hospital Stay (HOSPITAL_COMMUNITY): Payer: Medicare Other

## 2018-04-22 ENCOUNTER — Inpatient Hospital Stay (HOSPITAL_COMMUNITY): Payer: Medicare Other | Admitting: Anesthesiology

## 2018-04-22 ENCOUNTER — Encounter (HOSPITAL_COMMUNITY)
Admission: EM | Disposition: A | Discharge status: Home Health | Payer: Self-pay | Source: Home / Self Care | Attending: Internal Medicine

## 2018-04-22 HISTORY — PX: REMOVAL OF STONES: SHX5545

## 2018-04-22 HISTORY — PX: SPHINCTEROTOMY: SHX5544

## 2018-04-22 HISTORY — PX: EUS: SHX5427

## 2018-04-22 HISTORY — PX: ESOPHAGOGASTRODUODENOSCOPY (EGD) WITH PROPOFOL: SHX5813

## 2018-04-22 HISTORY — PX: ERCP: SHX5425

## 2018-04-22 LAB — CBC WITH DIFFERENTIAL/PLATELET
Abs Immature Granulocytes: 0.16 10*3/uL — ABNORMAL HIGH (ref 0.00–0.07)
Basophils Absolute: 0 10*3/uL (ref 0.0–0.1)
Basophils Relative: 0 %
Eosinophils Absolute: 0.4 10*3/uL (ref 0.0–0.5)
Eosinophils Relative: 4 %
HEMATOCRIT: 31.7 % — AB (ref 39.0–52.0)
Hemoglobin: 10.8 g/dL — ABNORMAL LOW (ref 13.0–17.0)
Immature Granulocytes: 1 %
LYMPHS ABS: 1.1 10*3/uL (ref 0.7–4.0)
Lymphocytes Relative: 10 %
MCH: 31.3 pg (ref 26.0–34.0)
MCHC: 34.1 g/dL (ref 30.0–36.0)
MCV: 91.9 fL (ref 80.0–100.0)
Monocytes Absolute: 1.3 10*3/uL — ABNORMAL HIGH (ref 0.1–1.0)
Monocytes Relative: 12 %
Neutro Abs: 8.1 10*3/uL — ABNORMAL HIGH (ref 1.7–7.7)
Neutrophils Relative %: 73 %
Platelets: 254 10*3/uL (ref 150–400)
RBC: 3.45 MIL/uL — ABNORMAL LOW (ref 4.22–5.81)
RDW: 12.5 % (ref 11.5–15.5)
WBC: 11.1 10*3/uL — ABNORMAL HIGH (ref 4.0–10.5)
nRBC: 0 % (ref 0.0–0.2)

## 2018-04-22 LAB — COMPREHENSIVE METABOLIC PANEL
ALT: 60 U/L — AB (ref 0–44)
AST: 31 U/L (ref 15–41)
Albumin: 2.3 g/dL — ABNORMAL LOW (ref 3.5–5.0)
Alkaline Phosphatase: 152 U/L — ABNORMAL HIGH (ref 38–126)
Anion gap: 8 (ref 5–15)
BUN: 13 mg/dL (ref 8–23)
CO2: 23 mmol/L (ref 22–32)
CREATININE: 1.41 mg/dL — AB (ref 0.61–1.24)
Calcium: 8.2 mg/dL — ABNORMAL LOW (ref 8.9–10.3)
Chloride: 107 mmol/L (ref 98–111)
GFR calc Af Amer: 50 mL/min — ABNORMAL LOW (ref >60)
GFR calc non Af Amer: 44 mL/min — ABNORMAL LOW (ref >60)
Glucose, Bld: 101 mg/dL — ABNORMAL HIGH (ref 70–99)
Potassium: 3.9 mmol/L (ref 3.5–5.1)
Sodium: 138 mmol/L (ref 135–145)
Total Bilirubin: 1.3 mg/dL — ABNORMAL HIGH (ref 0.3–1.2)
Total Protein: 5.8 g/dL — ABNORMAL LOW (ref 6.5–8.1)

## 2018-04-22 LAB — MAGNESIUM: Magnesium: 1.7 mg/dL (ref 1.7–2.4)

## 2018-04-22 SURGERY — UPPER ENDOSCOPIC ULTRASOUND (EUS) RADIAL
Anesthesia: General

## 2018-04-22 MED ORDER — SODIUM CHLORIDE 0.9 % IV SOLN
INTRAVENOUS | Status: DC | PRN
  Administered 2018-04-22: 20 mL

## 2018-04-22 MED ORDER — PROPOFOL 10 MG/ML IV BOLUS
INTRAVENOUS | Status: DC | PRN
  Administered 2018-04-22 (×2): 15 mg via INTRAVENOUS

## 2018-04-22 MED ORDER — SUGAMMADEX SODIUM 500 MG/5ML IV SOLN
INTRAVENOUS | Status: DC | PRN
  Administered 2018-04-22: 290.4 mg via INTRAVENOUS

## 2018-04-22 MED ORDER — ETOMIDATE 2 MG/ML IV SOLN
INTRAVENOUS | Status: DC | PRN
  Administered 2018-04-22: 16 mg via INTRAVENOUS

## 2018-04-22 MED ORDER — PROPOFOL 500 MG/50ML IV EMUL: INTRAVENOUS | Status: DC | PRN

## 2018-04-22 MED ORDER — ROCURONIUM BROMIDE 10 MG/ML (PF) SYRINGE
PREFILLED_SYRINGE | INTRAVENOUS | Status: DC | PRN
  Administered 2018-04-22: 50 mg via INTRAVENOUS

## 2018-04-22 MED ORDER — DEXAMETHASONE SODIUM PHOSPHATE 10 MG/ML IJ SOLN
INTRAMUSCULAR | Status: DC | PRN
  Administered 2018-04-22: 5 mg via INTRAVENOUS

## 2018-04-22 MED ORDER — LIDOCAINE 2% (20 MG/ML) 5 ML SYRINGE
INTRAMUSCULAR | Status: DC | PRN
  Administered 2018-04-22: 40 mg via INTRAVENOUS

## 2018-04-22 MED ORDER — PROPOFOL 500 MG/50ML IV EMUL
INTRAVENOUS | Status: DC | PRN
  Administered 2018-04-22: 50 ug/kg/min via INTRAVENOUS

## 2018-04-22 MED ORDER — LACTATED RINGERS IV SOLN
INTRAVENOUS | Status: DC | PRN
  Administered 2018-04-22: 08:00:00 via INTRAVENOUS

## 2018-04-22 MED ORDER — APIXABAN 5 MG PO TABS
5.0000 mg | ORAL_TABLET | Freq: Two times a day (BID) | ORAL | Status: DC
  Administered 2018-04-22 – 2018-04-23 (×2): 5 mg via ORAL
  Filled 2018-04-22 (×2): qty 1

## 2018-04-22 MED ORDER — FENTANYL CITRATE (PF) 250 MCG/5ML IJ SOLN
INTRAMUSCULAR | Status: DC | PRN
  Administered 2018-04-22: 25 ug via INTRAVENOUS
  Administered 2018-04-22: 75 ug via INTRAVENOUS

## 2018-04-22 NOTE — Op Note (Signed)
[facility] Patient Name: Vincent Barrett Procedure Date : 04/22/2018 MRN: [id number] Attending MD: Willis Modena , MD Date of Birth: [birth year] CSN: [id number] Age: 85 Admit Type: Inpatient Procedure:                Upper EUS Indications:              Elevated liver enzymes, Suspected                            choledocholithiasis (on IOC and MRCP). Providers:                Willis Modena, MD, Margaree Mackintosh, RN,                            Kandice Robinsons, Technician Referring MD:             [facility] Medicines:                Monitored Anesthesia Care Complications:            No immediate complications. Estimated Blood Loss:     Estimated blood loss: none. Procedure:                Pre-Anesthesia Assessment:                           - Prior to the procedure, a History and Physical                            was performed, and patient medications and                            allergies were reviewed. The patient's tolerance of                            previous anesthesia was also reviewed. The risks                            and benefits of the procedure and the sedation                            options and risks were discussed with the patient.                            All questions were answered, and informed consent                            was obtained. Prior Anticoagulants: The patient has                            taken Eliquis (apixaban), last dose was 2 weeks                            prior to procedure. ASA Grade Assessment: IV - A  patient with severe systemic disease that is a                            constant threat to life. After reviewing the risks                            and benefits, the patient was deemed in                            satisfactory condition to undergo the procedure.                           After obtaining informed consent, the endoscope was                            passed  under direct vision. Throughout the                            procedure, the patient's blood pressure, pulse, and                            oxygen saturations were monitored continuously. The                            [id number] ([id number]) Olympus Radial EUS scope was                            introduced through the mouth, and advanced to the                            second part of duodenum. The upper EUS was                            accomplished without difficulty. The patient                            tolerated the procedure well. Scope In: Scope Out: Findings:      ENDOSONOGRAPHIC FINDING: :      Evidence of a previous cholecystectomy was identified       endosonographically.      One stone was visualized endosonographically in the common bile duct.       The stone measured 4 mm in greatest dimension. The stone was triangular.       It was characterized by shadowing.      There was no sign of significant endosonographic abnormality in the       pancreatic head and pancreatic body.      No lymphadenopathy seen. Impression:               - Evidence of a cholecystectomy.                           - One stone was visualized endosonographically in  the common bile duct.                           - There was no sign of significant pathology in the                            pancreatic head and pancreatic body.                           - No specimens collected. Moderate Sedation:      None Recommendation:           - Perform an ERCP today by Dr. Magod for biliaEwing Schlein                            sphincterotomy and stone extraction.                           - Case discussed with Dr. Ewing Schlein. Procedure Code(s):        --- Professional ---                           [phone], Esophagogastroduodenoscopy, flexible,                            transoral; with endoscopic ultrasound examination,                            including the esophagus, stomach, and either the                             duodenum or a surgically altered stomach where the                            jejunum is examined distal to the anastomosis Diagnosis Code(s):        --- Professional ---                           Z90.49, Acquired absence of other specified parts                            of digestive tract                           K80.50, Calculus of bile duct without cholangitis                            or cholecystitis without obstruction                           R74.8, Abnormal levels of other serum enzymes CPT copyright 2018 [employer] Association. All rights reserved. The codes documented in this report are preliminary and upon coder review may  be revised to meet current compliance requirements. Willis Modena, MD 04/22/2018 9:09:50 AM This report has been signed electronically. Number of Addenda: 0

## 2018-04-22 NOTE — Anesthesia Postprocedure Evaluation (Signed)
Anesthesia Post Note  Patient: Roxana Peavey  Procedure(s) Performed: UPPER ENDOSCOPIC ULTRASOUND (EUS) RADIAL (N/A ) ENDOSCOPIC RETROGRADE CHOLANGIOPANCREATOGRAPHY (ERCP) (N/A )     Patient location during evaluation: PACU Anesthesia Type: General Level of consciousness: awake and alert and oriented Pain management: pain level controlled Vital Signs Assessment: post-procedure vital signs reviewed and stable Respiratory status: spontaneous breathing, nonlabored ventilation and respiratory function stable Cardiovascular status: blood pressure returned to baseline and stable Postop Assessment: no apparent nausea or vomiting Anesthetic complications: no    Last Vitals:  Vitals:   04/22/18 1015 04/22/18 1025  BP: (!) 175/85 (!) 154/70  Pulse: 87 80  Resp: (!) 22 (!) 21  Temp:    SpO2: 97% 97%    Last Pain:  Vitals:   04/22/18 1025  TempSrc:   PainSc: 0-No pain                 Alanson Hausmann A.

## 2018-04-22 NOTE — Op Note (Signed)
[facility] Patient Name: Vincent Barrett Procedure Date : 04/22/2018 MRN: [id number] Attending MD: Vida Rigger , MD Date of Birth: [birth year] CSN: [id number] Age: 85 Admit Type: Inpatient Procedure:                ERCP Indications:              Bile duct stone(s), Common bile duct stone(s) Providers:                Margaree Mackintosh, RN, Kandice Robinsons,                            Technician, Vida Rigger, MD Referring MD:              Medicines:                General Anesthesia Complications:            No immediate complications. Estimated Blood Loss:     Estimated blood loss: none. Procedure:                Pre-Anesthesia Assessment:                           - Prior to the procedure, a History and Physical                            was performed, and patient medications and                            allergies were reviewed. The patient's tolerance of                            previous anesthesia was also reviewed. The risks                            and benefits of the procedure and the sedation                            options and risks were discussed with the patient.                            All questions were answered, and informed consent                            was obtained. Prior Anticoagulants: The patient has                            taken Eliquis (apixaban), last dose was 2 weeks                            prior to procedure. ASA Grade Assessment: IV - A                            patient with severe systemic disease that is a  constant threat to life. After reviewing the risks                            and benefits, the patient was deemed in                            satisfactory condition to undergo the procedure.                           - Prior to the procedure, a History and Physical                            was performed, and patient medications and                            allergies were reviewed. The  patient's tolerance of                            previous anesthesia was also reviewed. The risks                            and benefits of the procedure and the sedation                            options and risks were discussed with the patient.                            All questions were answered, and informed consent                            was obtained. Prior Anticoagulants: The patient has                            taken Eliquis (apixaban), last dose was 8 days                            prior to procedure. ASA Grade Assessment: III - A                            patient with severe systemic disease. After                            reviewing the risks and benefits, the patient was                            deemed in satisfactory condition to undergo the                            procedure.                           After obtaining informed consent, the scope was  passed under direct vision. Throughout the                            procedure, the patient's blood pressure, pulse, and                            oxygen saturations were monitored continuously. The                            [id number]-Q180V ([id number]) Olympus duodenoscope was                            introduced through the mouth, and used to inject                            contrast into and used to cannulate the bile duct.                            The ERCP was accomplished without difficulty. The                            patient tolerated the procedure well. Scope In: Scope Out: Findings:      The major papilla was normal. Biliary sphincterotomy was made with a       Hydratome sphincterotome using ERBE electrocautery. There was no       post-sphincterotomy bleeding. Choledocholithiasis was found in a       nondilated duct. To discover objects, the biliary tree was swept with a       12 mm balloon starting at the bifurcation. One stone was removed. No       stones remained after  mulktiple pull thru and neg occlusion cholangiogr. Impression:               - The major papilla appeared normal.                           - Choledocholithiasis was found. Complete removal                            was accomplished by biliary sphincterotomy and                            balloon extraction.                           - A biliary sphincterotomy was performed.                           - The biliary tree was swept. Recommendation:           - Clear liquid diet for 6 hours. slowly adv as                            tolerated                           - Continue present  medications.                           - Return to GI clinic PRN.                           - Telephone GI clinic if symptomatic PRN. Procedure Code(s):        --- Professional ---                           [phone], Endoscopic retrograde                            cholangiopancreatography (ERCP); with removal of                            calculi/debris from biliary/pancreatic duct(s)                           43262, Endoscopic retrograde                            cholangiopancreatography (ERCP); with                            sphincterotomy/papillotomy Diagnosis Code(s):        --- Professional ---                           K80.50, Calculus of bile duct without cholangitis                            or cholecystitis without obstruction CPT copyright 2018 American Medical Association. All rights reserved. The codes documented in this report are preliminary and upon coder review may  be revised to meet current compliance requirements. Vida Rigger, MD 04/22/2018 9:53:13 AM This report has been signed electronically. Number of Addenda: 0

## 2018-04-22 NOTE — Interval H&P Note (Signed)
History and Physical Interval Note:  04/22/2018 8:19 AM  Vincent Barrett  has presented today for surgery, with the diagnosis of CBD stones.  The various methods of treatment have been discussed with the patient and family. After consideration of risks, benefits and other options for treatment, the patient has consented to  Procedure(s): UPPER ENDOSCOPIC ULTRASOUND (EUS) RADIAL (N/A) ENDOSCOPIC RETROGRADE CHOLANGIOPANCREATOGRAPHY (ERCP) (N/A) as a surgical intervention.  The patient's history has been reviewed, patient examined, no change in status, stable for surgery.  I have reviewed the patient's chart and labs.  Questions were answered to the patient's satisfaction.     Freddy Jaksch

## 2018-04-22 NOTE — Progress Notes (Addendum)
Patient ID: Vincent Barrett, male   DOB: [birth year], 85 y.o.   MRN: [id number]  PROGRESS NOTE    Vincent Barrett  WUJ:[id number] DOB: 03-Nov-1925 DOA: 04/14/2018 PCP: Vincent Banner, MD   Brief Narrative:  85 year old male with history of hypertension, A. fib on Eliquis, chronic renal disease stage III, BPH presented with abdominal pain.  MRCP was concerning for acute calculus cholecystitis; patient was taken to the OR urgently and underwent lap cholecystectomy on 04/16/2018.  Intraoperative cholangiogram was concerning for partial filling defects and GI is following for planned EUS and possible ERCP on Thursday.  Assessment & Plan:   Principal Problem:   Cholelithiasis with choledocholithiasis Active Problems:   Atrial fibrillation (HCC)   Essential hypertension   COPD (chronic obstructive pulmonary disease) (HCC)   On anticoagulant therapy   Abdominal pain   Hypokalemia   CKD (chronic kidney disease) stage 3, GFR 30-59 ml/min (HCC)   Acute calculus cholecystitis -Status post laparoscopic cholecystectomy on 04/16/2018. -Wound care as per general surgery recommendations.  General surgery has signed off.  Outpatient follow-up with general surgery  CBD stone with elevated LFTs -GI following.  Status post EUS and ERCP today.  No pancreatic head or body abnormality identified.  Patient underwent biliary sphincterotomy and balloon extraction of the CBD stone.  -Will DC Zosyn   Paroxysmal A. fib -Eliquis on hold for now.  Will resume Eliquis.  Hypertension -Monitor blood pressure.  Continue amlodipine, metoprolol and doxazosin.  Losartan and hydrochlorothiazide have been on hold.  CKD stage III -Creatinine stable.  Monitor   DVT prophylaxis: SCDs Code Status: Full Family Communication: None at bedside Disposition Plan: Home in 1 to 2 days once cleared by GI  Consultants: General surgery/GI  Procedures: Laparoscopic cholecystectomy on 04/16/2018 EUS on 04/22/2018 Impression:               -  Evidence of a cholecystectomy.                           - One stone was visualized endosonographically in                            the common bile duct.                           - There was no sign of significant pathology in the                            pancreatic head and pancreatic body.                           - No specimens collected. Moderate Sedation:      None Recommendation:           - Perform an ERCP today by Dr. Ewing Schlein for biliary                            sphincterotomy and stone extraction.                           - Case discussed with Dr. Ewing Schlein.  ERCP on 04/22/2018 Findings:      The major papilla was normal. Biliary sphincterotomy was made with  a       Hydratome sphincterotome using ERBE electrocautery. There was no       post-sphincterotomy bleeding. Choledocholithiasis was found in a       nondilated duct. To discover objects, the biliary tree was swept with a       12 mm balloon starting at the bifurcation. One stone was removed. No       stones remained after mulktiple pull thru and neg occlusion cholangiogr. Impression:               - The major papilla appeared normal.                           - Choledocholithiasis was found. Complete removal                            was accomplished by biliary sphincterotomy and                            balloon extraction.                           - A biliary sphincterotomy was performed.                           - The biliary tree was swept. Recommendation:           - Clear liquid diet for 6 hours. slowly adv as                            tolerated                           - Continue present medications.                           - Return to GI clinic PRN.                           - Telephone GI clinic if symptomatic PRN.   Antimicrobials: Zosyn from 04/16/2018 onwards   Subjective: Patient seen and examined at bedside.  He is back from the procedure.  He is sleepy, hardly wakes up on calling his name.  No  overnight fever or vomiting reported. Objective: Vitals:   04/22/18 1001 04/22/18 1015 04/22/18 1025 04/22/18 1100  BP: (!) 169/63 (!) 175/85 (!) 154/70 130/68  Pulse: 76 87 80 67  Resp:  (!) 22 (!) 21 15  Temp:      TempSrc:      SpO2: 97% 97% 97% 96%  Weight:      Height:        Intake/Output Summary (Last 24 hours) at 04/22/2018 1324 Last data filed at 04/22/2018 1112 Gross per 24 hour  Intake 620 ml  Output 1001 ml  Net -381 ml   Filed Weights   04/16/18 0955  Weight: 72.6 kg    Examination:  General exam: Sleepy, hardly wakes up on calling his name.  Elderly male lying in bed.  No acute distress Respiratory system: Bilateral decreased breath sounds at bases, scattered crackles.  No wheezing Cardiovascular system: Rate controlled, S1-S2 heard Gastrointestinal  system: Abdomen is nondistended, soft and mildly tender in the right upper quadrant. Normal bowel sounds heard. Extremities: No cyanosis, edema    Data Reviewed: I have personally reviewed following labs and imaging studies  CBC: Recent Labs  Lab 04/17/18 0326 04/18/18 0748 04/19/18 0410 04/20/18 0336 04/22/18 0336  WBC 13.2* 10.4 9.3 10.0 11.1*  NEUTROABS  --   --   --   --  8.1*  HGB 10.0* 10.2* 10.4* 10.1* 10.8*  HCT 30.1* 30.5* 32.3* 30.6* 31.7*  MCV 91.8 93.6 94.4 93.9 91.9  PLT 156 176 193 209 254   Basic Metabolic Panel: Recent Labs  Lab 04/16/18 0203 04/17/18 0326 04/18/18 0748 04/19/18 0410 04/20/18 0336 04/21/18 0329 04/22/18 0336  NA 135 136 139 141 138  --  138  K 3.2* 3.8 4.3 4.2 4.2  --  3.9  CL 105 107 113* 113* 111  --  107  CO2 21* 22 20* 22 20*  --  23  GLUCOSE 135* 155* 129* 125* 126*  --  101*  BUN 19 25* 24* 19 14  --  13  CREATININE 1.52* 1.59* 1.57* 1.56* 1.43*  --  1.41*  CALCIUM 7.8* 7.5* 7.9* 7.9* 8.1*  --  8.2*  MG 1.6*  --  2.0  --  1.7 1.9 1.7   GFR: Estimated Creatinine Clearance: 31.8 mL/min (A) (by C-G formula based on SCr of 1.41 mg/dL (H)). Liver  Function Tests: Recent Labs  Lab 04/17/18 0326 04/18/18 0748 04/19/18 0410 04/20/18 0336 04/22/18 0336  AST 75* 61* 45* 42* 31  ALT 124* 109* 93* 81* 60*  ALKPHOS 89 153* 165* 179* 152*  BILITOT 1.9* 1.6* 1.6* 1.6* 1.3*  PROT 5.4* 5.7* 5.8* 5.6* 5.8*  ALBUMIN 2.2* 2.4* 2.3* 2.2* 2.3*   Recent Labs  Lab 04/16/18 0203  LIPASE 18   No results for input(s): AMMONIA in the last 168 hours. Coagulation Profile: No results for input(s): INR, PROTIME in the last 168 hours. Cardiac Enzymes: No results for input(s): CKTOTAL, CKMB, CKMBINDEX, TROPONINI in the last 168 hours. BNP (last 3 results) No results for input(s): PROBNP in the last 8760 hours. HbA1C: No results for input(s): HGBA1C in the last 72 hours. CBG: No results for input(s): GLUCAP in the last 168 hours. Lipid Profile: No results for input(s): CHOL, HDL, LDLCALC, TRIG, CHOLHDL, LDLDIRECT in the last 72 hours. Thyroid Function Tests: No results for input(s): TSH, T4TOTAL, FREET4, T3FREE, THYROIDAB in the last 72 hours. Anemia Panel: No results for input(s): VITAMINB12, FOLATE, FERRITIN, TIBC, IRON, RETICCTPCT in the last 72 hours. Sepsis Labs: No results for input(s): PROCALCITON, LATICACIDVEN in the last 168 hours.  Recent Results (from the past 240 hour(s))  Culture, Urine     Status: None   Collection Time: 04/16/18  8:50 AM  Result Value Ref Range Status   Specimen Description URINE, CLEAN CATCH  Final   Special Requests NONE  Final   Culture   Final    NO GROWTH Performed at [facility], [address] [place], [place] [address]    Report Status 04/17/2018 FINAL  Final  Surgical pcr screen     Status: None   Collection Time: 04/16/18  9:35 AM  Result Value Ref Range Status   MRSA, PCR NEGATIVE NEGATIVE Final   Staphylococcus aureus NEGATIVE NEGATIVE Final    Comment: (NOTE) The Xpert SA Assay (FDA approved for NASAL specimens in patients 83 years of age and older), is one component of a  comprehensive surveillance program. It is not intended to diagnose infection nor to guide or monitor treatment. Performed at [facility] [place] [facility], [address], [place], [place] [address]          Radiology Studies: Dg Ercp Biliary & Pancreatic Ducts  Result Date: 04/22/2018 CLINICAL DATA:  Choledocholithiasis EXAM: ERCP with sphincterotomy and balloon sweep for stone removal TECHNIQUE: Multiple spot images obtained with the fluoroscopic device and submitted for interpretation post-procedure. FLUOROSCOPY TIME:  Fluoroscopy Time:  2 minutes 34 seconds Radiation Exposure Index (if provided by the fluoroscopic device): Not available Number of Acquired Spot Images: 4 COMPARISON:  04/15/2018 FINDINGS: Retrograde cholangiogram performed during the ERCP. Previous cholecystectomy noted. Mild biliary dilatation. Balloon sweep performed for stone removal. No occlusive filling defect. No focal narrowing or stricture. IMPRESSION: Limited cholangiogram during sphincterotomy and balloon sweep for CBD stone removal as above. These images were submitted for radiologic interpretation only. Please see the procedural report for the amount of contrast and the fluoroscopy time utilized. Electronically Signed   By: Judie Petit.  Shick M.D.   On: 04/22/2018 10:30        Scheduled Meds:  amLODipine  2.5 mg Oral Daily   docusate sodium  100 mg Oral BID   doxazosin  4 mg Oral Daily   metoprolol succinate  25 mg Oral Daily   multivitamin  1 tablet Oral Daily   polyethylene glycol  17 g Oral Daily   sodium chloride flush  3 mL Intravenous Once   vitamin C  1,000 mg Oral Daily   Continuous Infusions:  lactated ringers Stopped (04/16/18 1211)   magnesium sulfate 1 - 4 g bolus IVPB     piperacillin-tazobactam (ZOSYN)  IV 3.375 g (04/22/18 0554)     LOS: 7 days        Glade Lloyd, MD [facility] 04/22/2018, 1:24 PM

## 2018-04-22 NOTE — Discharge Instructions (Signed)
[facility]: POST OP INSTRUCTIONS Always review your discharge instruction sheet given to you by the facility where your surgery was performed. IF YOU HAVE DISABILITY OR FAMILY LEAVE FORMS, YOU MUST BRING THEM TO THE OFFICE FOR PROCESSING.   DO NOT GIVE THEM TO YOUR DOCTOR.  PAIN CONTROL  1. First take acetaminophen (Tylenol) AND/or ibuprofen (Advil) to control your pain after surgery.  Follow directions on package.  Taking acetaminophen (Tylenol) and/or ibuprofen (Advil) regularly after surgery will help to control your pain and lower the amount of prescription pain medication you may need.  You should not take more than 3,000 mg (3 grams) of acetaminophen (Tylenol) in 24 hours.  You should not take ibuprofen (Advil), aleve, motrin, naprosyn or other NSAIDS if you have a history of stomach ulcers or chronic kidney disease.  2. A prescription for pain medication may be given to you upon discharge.  Take your pain medication as prescribed, if you still have uncontrolled pain after taking acetaminophen (Tylenol) or ibuprofen (Advil). 3. Use ice packs to help control pain. 4. If you need a refill on your pain medication, please contact your pharmacy.  They will contact our office to request authorization. Prescriptions will not be filled after 5pm or on week-ends.  HOME MEDICATIONS 5. Take your usually prescribed medications unless otherwise directed.  DIET 6. You should follow a light diet the first few days after arrival home.  Be sure to include lots of fluids daily. Avoid fatty, fried foods.   CONSTIPATION 7. It is common to experience some constipation after surgery and if you are taking pain medication.  Increasing fluid intake and taking a stool softener (such as Colace) will usually help or prevent this problem from occurring.  A mild laxative (Milk of Magnesia or Miralax) should be taken according to package instructions if there are no bowel  movements after 48 hours.  WOUND/INCISION CARE 8. Most patients will experience some swelling and bruising in the area of the incisions.  Ice packs will help.  Swelling and bruising can take several days to resolve.  9. Unless discharge instructions indicate otherwise, follow guidelines below  a. STERI-STRIPS - you may remove your outer bandages 48 hours after surgery, and you may shower at that time.  You have steri-strips (small skin tapes) in place directly over the incision.  These strips should be left on the skin for 7-10 days.   b. DERMABOND/SKIN GLUE - you may shower in 24 hours.  The glue will flake off over the next 2-3 weeks. 10. Any sutures or staples will be removed at the office during your follow-up visit.  ACTIVITIES 11. You may resume regular (light) daily activities beginning the next day--such as daily self-care, walking, climbing stairs--gradually increasing activities as tolerated.  You may have sexual intercourse when it is comfortable.  Refrain from any heavy lifting or straining until approved by your doctor. a. You may drive when you are no longer taking prescription pain medication, you can comfortably wear a seatbelt, and you can safely maneuver your car and apply brakes.  FOLLOW-UP 12. You should see your doctor in the office for a follow-up appointment approximately 2-3 weeks after your surgery.  You should have been given your post-op/follow-up appointment when your surgery was scheduled.  If you did not receive a post-op/follow-up appointment, make sure that you call for this appointment within a day or two after you arrive home to insure a convenient appointment time.  WHEN TO CALL YOUR DOCTOR: 1. Fever over 101.0 2. Inability to urinate 3. Continued bleeding from incision. 4. Increased pain, redness, or drainage from the incision. 5. Increasing abdominal pain  The clinic staff is available to answer your questions during regular business hours.  Please dont  hesitate to call and ask to speak to one of the nurses for clinical concerns.  If you have a medical emergency, go to the nearest emergency room or call 911.  A surgeon from [facility] is always on call at the hospital. [address], [address], [place], [place]  [address] ? [address] [address], [place], [place]   [address] [phone] ? [phone] ? FAX [phone] Web site: [email or web address]  Information on my medicine - ELIQUIS (apixaban)   Why was Eliquis prescribed for you? Eliquis was prescribed for you to reduce the risk of forming blood clots that can cause a stroke if you have a medical condition called atrial fibrillation (a type of irregular heartbeat) OR to reduce the risk of a blood clots forming after orthopedic surgery.  What do You need to know about Eliquis ? Take your Eliquis TWICE DAILY - one tablet in the morning and one tablet in the evening with or without food.  It would be best to take the doses about the same time each day.  If you have difficulty swallowing the tablet whole please discuss with your pharmacist how to take the medication safely.  Take Eliquis exactly as prescribed by your doctor and DO NOT stop taking Eliquis without talking to the doctor who prescribed the medication.  Stopping may increase your risk of developing a new clot or stroke.  Refill your prescription before you run out.  After discharge, you should have regular check-up appointments with your healthcare provider that is prescribing your Eliquis.  In the future your dose may need to be changed if your kidney function or weight changes by a significant amount or as you get older.  What do you do if you miss a dose? If you miss a dose, take it as soon as you remember on the same day and resume taking twice daily.  Do not take more than one dose of ELIQUIS at the same time.  Important Safety Information A possible side effect of Eliquis is bleeding. You  should call your healthcare provider right away if you experience any of the following: ? Bleeding from an injury or your nose that does not stop. ? Unusual colored urine (red or dark brown) or unusual colored stools (red or black). ? Unusual bruising for unknown reasons. ? A serious fall or if you hit your head (even if there is no bleeding).  Some medicines may interact with Eliquis and might increase your risk of bleeding or clotting while on Eliquis. To help avoid this, consult your healthcare provider or pharmacist prior to using any new prescription or non-prescription medications, including herbals, vitamins, non-steroidal anti-inflammatory drugs (NSAIDs) and supplements.  This website has more information on Eliquis (apixaban): www.[employer].com.cy.

## 2018-04-22 NOTE — Anesthesia Procedure Notes (Signed)
Procedure Name: Intubation Date/Time: 04/22/2018 9:00 AM Performed by: Mariea Clonts, CRNA Pre-anesthesia Checklist: Patient identified, Emergency Drugs available, Suction available and Patient being monitored Patient Re-evaluated:Patient Re-evaluated prior to induction Oxygen Delivery Method: Circle System Utilized Preoxygenation: Pre-oxygenation with 100% oxygen Induction Type: IV induction Ventilation: Mask ventilation without difficulty Laryngoscope Size: Mac and 4 Grade View: Grade I Tube type: Oral Tube size: 7.0 mm Number of attempts: 1 Airway Equipment and Method: Stylet and Oral airway Placement Confirmation: ETT inserted through vocal cords under direct vision,  positive ETCO2 and breath sounds checked- equal and bilateral Tube secured with: Tape Dental Injury: Teeth and Oropharynx as per pre-operative assessment

## 2018-04-22 NOTE — Transfer of Care (Signed)
Immediate Anesthesia Transfer of Care Note  Patient: Vincent Barrett  Procedure(s) Performed: UPPER ENDOSCOPIC ULTRASOUND (EUS) RADIAL (N/A ) ENDOSCOPIC RETROGRADE CHOLANGIOPANCREATOGRAPHY (ERCP) (N/A )  Patient Location: Endoscopy Unit  Anesthesia Type:MAC and General  Level of Consciousness: awake, alert  and oriented  Airway & Oxygen Therapy: Patient Spontanous Breathing and Patient connected to nasal cannula oxygen  Post-op Assessment: Report given to RN, Post -op Vital signs reviewed and stable and Patient moving all extremities X 4  Post vital signs: Reviewed and stable  Last Vitals:  Vitals Value Taken Time  BP    Temp    Pulse    Resp    SpO2      Last Pain:  Vitals:   04/22/18 1025  TempSrc:   PainSc: 0-No pain      Patients Stated Pain Goal: 2 (72/09/47 0962)  Complications: No apparent anesthesia complications

## 2018-04-22 NOTE — Plan of Care (Signed)
  Problem: Education: Goal: Knowledge of General Education information will improve Description Including pain rating scale, medication(s)/side effects and non-pharmacologic comfort measures Outcome: Progressing   Problem: Health Behavior/Discharge Planning: Goal: Ability to manage health-related needs will improve Outcome: Progressing   Problem: Clinical Measurements: Goal: Ability to maintain clinical measurements within normal limits will improve Outcome: Progressing Goal: Will remain free from infection Outcome: Progressing Goal: Diagnostic test results will improve Outcome: Progressing Goal: Respiratory complications will improve Outcome: Progressing Goal: Cardiovascular complication will be avoided Outcome: Progressing   Problem: Activity: Goal: Risk for activity intolerance will decrease Outcome: Progressing   Problem: Nutrition: Goal: Adequate nutrition will be maintained Outcome: Progressing   Problem: Coping: Goal: Level of anxiety will decrease Outcome: Progressing   Problem: Elimination: Goal: Will not experience complications related to bowel motility Outcome: Progressing Goal: Will not experience complications related to urinary retention Outcome: Progressing   Problem: Pain Managment: Goal: General experience of comfort will improve Outcome: Progressing   Problem: Safety: Goal: Ability to remain free from injury will improve Outcome: Progressing   Problem: Skin Integrity: Goal: Risk for impaired skin integrity will decrease Outcome: Progressing   Patient returned to unit at aprox 11 am. ERCP complete with one stone removed.  Clear liq diet then advance to full.

## 2018-04-22 NOTE — Progress Notes (Signed)
Pt transported to Endo at 0700. Report given to Endo RN.

## 2018-04-22 NOTE — Care Management Note (Addendum)
Case Management Note  Patient Details  Name: Vincent Barrett MRN: [id number] Date of Birth: [birth year]  Subjective/Objective:   Presents with ABD pain/ found to have acute cholecystitis with Cholelithiasis. Hx of hypertension, A. fib on Eliquis, chronic renal disease stage III, BPH  From home with wife. PTA independent with ADL's . DME: cane.             Kassidy Koehnke (Spouse)     [phone]       S/p  LAPAROSCOPIC CHOLECYSTECTOMY WITH INTRAOPERATIVE CHOLANGIOGRAM , 04/16/2018  S/p EUS and ERCP 04/30/2018  PCP: [name]  Action/Plan: Transition to home when medically stable .... no present needs identified per NCM. NCM wil continue to monitor ....  Pt has transportation to home Expected Discharge Date:  04/19/18               Expected Discharge Plan:  Home/Self Care  In-House Referral:  NA  Discharge planning Services  CM Consult  Post Acute Care Choice:    Choice offered to:  Patient  DME Arranged:  N/A DME Agency:  NA  HH Arranged:    HH Agency:     Status of Service:  In process, will continue to follow  If discussed at Long Length of Stay Meetings, dates discussed:    Additional Comments:  Epifanio Lesches, RN 04/22/2018, 2:02 PM

## 2018-04-23 DIAGNOSIS — K8043 Calculus of bile duct with acute cholecystitis with obstruction: Secondary | ICD-10-CM

## 2018-04-23 LAB — CBC WITH DIFFERENTIAL/PLATELET
Abs Immature Granulocytes: 0.18 10*3/uL — ABNORMAL HIGH (ref 0.00–0.07)
Basophils Absolute: 0 10*3/uL (ref 0.0–0.1)
Basophils Relative: 0 %
Eosinophils Absolute: 0 10*3/uL (ref 0.0–0.5)
Eosinophils Relative: 0 %
HCT: 33.1 % — ABNORMAL LOW (ref 39.0–52.0)
Hemoglobin: 11.4 g/dL — ABNORMAL LOW (ref 13.0–17.0)
Immature Granulocytes: 2 %
Lymphocytes Relative: 11 %
Lymphs Abs: 1.3 10*3/uL (ref 0.7–4.0)
MCH: 31.6 pg (ref 26.0–34.0)
MCHC: 34.4 g/dL (ref 30.0–36.0)
MCV: 91.7 fL (ref 80.0–100.0)
MONO ABS: 1.6 10*3/uL — AB (ref 0.1–1.0)
Monocytes Relative: 13 %
Neutro Abs: 9 10*3/uL — ABNORMAL HIGH (ref 1.7–7.7)
Neutrophils Relative %: 74 %
Platelets: 304 10*3/uL (ref 150–400)
RBC: 3.61 MIL/uL — ABNORMAL LOW (ref 4.22–5.81)
RDW: 12.3 % (ref 11.5–15.5)
WBC: 12.2 10*3/uL — ABNORMAL HIGH (ref 4.0–10.5)
nRBC: 0 % (ref 0.0–0.2)

## 2018-04-23 LAB — COMPREHENSIVE METABOLIC PANEL
ALT: 54 U/L — ABNORMAL HIGH (ref 0–44)
AST: 30 U/L (ref 15–41)
Albumin: 2.3 g/dL — ABNORMAL LOW (ref 3.5–5.0)
Alkaline Phosphatase: 147 U/L — ABNORMAL HIGH (ref 38–126)
Anion gap: 9 (ref 5–15)
BUN: 17 mg/dL (ref 8–23)
CO2: 22 mmol/L (ref 22–32)
Calcium: 8.4 mg/dL — ABNORMAL LOW (ref 8.9–10.3)
Chloride: 106 mmol/L (ref 98–111)
Creatinine, Ser: 1.43 mg/dL — ABNORMAL HIGH (ref 0.61–1.24)
GFR calc Af Amer: 50 mL/min — ABNORMAL LOW (ref >60)
GFR calc non Af Amer: 43 mL/min — ABNORMAL LOW (ref >60)
Glucose, Bld: 112 mg/dL — ABNORMAL HIGH (ref 70–99)
Potassium: 3.8 mmol/L (ref 3.5–5.1)
Sodium: 137 mmol/L (ref 135–145)
Total Bilirubin: 1.4 mg/dL — ABNORMAL HIGH (ref 0.3–1.2)
Total Protein: 5.7 g/dL — ABNORMAL LOW (ref 6.5–8.1)

## 2018-04-23 LAB — MAGNESIUM: MAGNESIUM: 1.7 mg/dL (ref 1.7–2.4)

## 2018-04-23 MED ORDER — TRAMADOL HCL 50 MG PO TABS: 50.0000 mg | ORAL_TABLET | Freq: Four times a day (QID) | ORAL | 0 refills | Status: DC | PRN

## 2018-04-23 MED ORDER — ONDANSETRON HCL 4 MG PO TABS: 4.0000 mg | ORAL_TABLET | Freq: Four times a day (QID) | ORAL | 0 refills | Status: DC | PRN

## 2018-04-23 NOTE — Discharge Summary (Signed)
Physician Discharge Summary  Vincent Barrett ZOX:[id number] DOB: 17-Oct-1925 DOA: 04/14/2018  PCP: [name], MD  Admit date: 04/14/2018 Discharge date: 04/23/2018  Admitted From: Home Disposition:  Home  Recommendations for Outpatient Follow-up:  1. Follow up with PCP in 1 week  2. Follow-up with GI as needed 3. Follow-up with general surgery 4. Follow up in ED if symptoms worsen or new appear   Home Health: No Equipment/Devices: None  Discharge Condition: Stable CODE STATUS: Full  Diet recommendation: Heart healthy  Brief/Interim Summary: 85 year old male with history of hypertension, A. fib on Eliquis, chronic renal disease stage III, BPH presented with abdominal pain.  MRCP was concerning for acute calculus cholecystitis; patient was taken to the OR urgently and underwent lap cholecystectomy on 04/16/2018.  Intraoperative cholangiogram was concerning for partial filling defects.  He underwent ERCP and removal of CBD stone along with EUS.  GI has cleared the patient for discharge.  Discharge Diagnoses:  Principal Problem:   Cholelithiasis with choledocholithiasis Active Problems:   Atrial fibrillation (HCC)   Essential hypertension   COPD (chronic obstructive pulmonary disease) (HCC)   On anticoagulant therapy   Abdominal pain   Hypokalemia   CKD (chronic kidney disease) stage 3, GFR 30-59 ml/min (HCC)  Acute calculus cholecystitis -Status post laparoscopic cholecystectomy on 04/16/2018. -Wound care as per general surgery recommendations.  General surgery has signed off.  Outpatient follow-up with general surgery  CBD stone with elevated LFTs -Status post EUS and ERCP on 03/24/2018.  No pancreatic head or body abnormality identified.  Patient underwent biliary sphincterotomy and balloon extraction of the CBD stone.  -Zosyn discontinued -Patient is tolerating diet with no worsening abdominal pain or fevers.  GI has cleared the patient for discharge.   Paroxysmal A.  fib -Discharge home on Eliquis  Hypertension -Continue home regimen.  CKD stage III -Creatinine stable.  Outpatient follow-up. Discharge Instructions  Discharge Instructions    Call MD for:  difficulty breathing, headache or visual disturbances   Complete by:  As directed    Call MD for:  extreme fatigue   Complete by:  As directed    Call MD for:  hives   Complete by:  As directed    Call MD for:  persistant dizziness or light-headedness   Complete by:  As directed    Call MD for:  persistant nausea and vomiting   Complete by:  As directed    Call MD for:  severe uncontrolled pain   Complete by:  As directed    Call MD for:  temperature >100.4   Complete by:  As directed    Diet - low sodium heart healthy   Complete by:  As directed    Increase activity slowly   Complete by:  As directed      Allergies as of 04/23/2018      Reactions   Quinine Nausea And Vomiting      Medication List    TAKE these medications   amLODipine 2.5 MG tablet Commonly known as:  NORVASC Take 1 tablet (2.5 mg total) by mouth daily.   apixaban 5 MG Tabs tablet Commonly known as:  Eliquis Take 1 tablet (5 mg total) by mouth 2 (two) times daily. LAST REFILL UNTIL F/U WITH CARDIOLOGIST.   doxazosin 4 MG tablet Commonly known as:  CARDURA Take 1 tablet (4 mg total) by mouth daily. KEEP OV.   losartan-hydrochlorothiazide 100-25 MG tablet Commonly known as:  HYZAAR Take 1 tablet by mouth daily.  metoprolol succinate 25 MG 24 hr tablet Commonly known as:  TOPROL-XL TAKE 1 TABLET (25 MG TOTAL) BY MOUTH DAILY.   ondansetron 4 MG tablet Commonly known as:  ZOFRAN Take 1 tablet (4 mg total) by mouth every 6 (six) hours as needed for nausea.   PreserVision AREDS 2 Caps Take 1 capsule by mouth daily.   traMADol 50 MG tablet Commonly known as:  Ultram Take 1 tablet (50 mg total) by mouth every 6 (six) hours as needed for severe pain.   vitamin C 1000 MG tablet Take 1,000 mg by mouth  daily.      Follow-up Information    Surgery, [facility]. Go on 04/29/2018.   Specialty:  General Surgery Why:  Follow up appointment scheduled for 2:30 PM. Please arrive 30 min prior to appointment time. Bring photo ID and insurance information.  Contact information: [address] [place] [address] [phone]        [name], MD. Schedule an appointment as soon as possible for a visit in 1 week(s).   Specialty:  Family Medicine Contact information: [address] [place] [address] [phone]        Chrystie Nose, MD .   Specialty:  Cardiology Contact information: [address] [place] [address] [phone]          Allergies  Allergen Reactions  . Quinine Nausea And Vomiting    Consultations:  General surgery/GI   Procedures/Studies: Dg Abd 1 View  Result Date: 04/19/2018 CLINICAL DATA:  85 year old male with anterior abdominal pain. Status post cholecystectomy postoperative day 3. EXAM: ABDOMEN - 1 VIEW COMPARISON:  Radiographs 04/18/2018 and earlier. FINDINGS: Supine views. Stable cholecystectomy clips. Oral contrast which was in the right colon yesterday is now in the descending and visible sigmoid colon. Bowel gas pattern is within normal limits. No definite pneumoperitoneum on these supine views. Streaky left lung base opacity is stable. The right lung base appears clear. No acute osseous abnormality identified. IMPRESSION: 1. Normal bowel gas pattern with transit of oral contrast since yesterday into the descending and sigmoid colon. 2. Continued streaky left lung base opacity. Electronically Signed   By: Odessa Fleming M.D.   On: 04/19/2018 16:25   Dg Cholangiogram Operative  Result Date: 04/16/2018 CLINICAL DATA:  Intraoperative cholangiogram during laparoscopic cholecystectomy. EXAM: INTRAOPERATIVE CHOLANGIOGRAM FLUOROSCOPY TIME:  20 seconds COMPARISON:  MRCP-04/15/2018 FINDINGS: Intraoperative  cholangiographic images of the right upper abdominal quadrant during laparoscopic cholecystectomy are provided for review. Surgical clips overlie the expected location of the gallbladder fossa. Contrast injection demonstrates selective cannulation of the central aspect of the cystic duct. There is passage of contrast through the central aspect of the cystic duct with filling of a mildly dilated common bile duct. There is passage of contrast though the CBD and into the descending portion of the duodenum. There is minimal reflux of injected contrast into the common hepatic duct and central aspect of the non dilated intrahepatic biliary system. There is a lenticular shaped nonocclusive filling defect within the distal aspect of the CBD which could be indicative of choledocholithiasis questioned on preceding MRCP. IMPRESSION: Lenticular shaped nonocclusive filling defect within the distal aspect of the CBD, potentially artifactual due to underdistention though could be indicative of nonocclusive choledocholithiasis questioned on preceding MRCP. Further evaluation and management with ERCP could be performed as indicated. Electronically Signed   By: Simonne Come M.D.   On: 04/16/2018 11:51   Ct Abdomen  Pelvis W Contrast  Addendum Date: 04/15/2018   ADDENDUM REPORT: 04/15/2018 03:42 ADDENDUM: Additional clinical information, jaundice. The patient's CT images are reviewed. Extrahepatic bile duct is slightly enlarged, measuring up to 9 mm. Questionable density within the distal duct at the head of pancreas. Recommend correlation with LFTs. MRCP could be obtained if concern for ductal obstruction is given history of jaundice Electronically Signed   By: [name] M.D.   On: 04/15/2018 03:42   Result Date: 04/15/2018 CLINICAL DATA:  Abdominal pain with nausea and vomiting EXAM: CT ABDOMEN AND PELVIS WITH CONTRAST TECHNIQUE: Multidetector CT imaging of the abdomen and pelvis was performed using the standard protocol  following bolus administration of intravenous contrast. CONTRAST:  80mL OMNIPAQUE IOHEXOL 300 MG/ML  SOLN COMPARISON:  PET CT 01/08/2018, CT 12/30/2017 FINDINGS: Lower chest: Lung bases again demonstrate oval subpleural mass in the left lower lobe, this measures 4.1 x 2.2 cm and appears slightly increased in size. No significant pleural effusion. Small hiatal hernia. Hepatobiliary: Cyst at the dome of the liver. Calcified stone at the neck of the gallbladder. Suspected edema or fluid adjacent to the gallbladder fundus. No biliary dilatation Pancreas: Unremarkable. No pancreatic ductal dilatation or surrounding inflammatory changes. Spleen: Normal in size without focal abnormality. Adrenals/Urinary Tract: Adrenal glands are unremarkable. Kidneys are normal, without renal calculi, focal lesion, or hydronephrosis. Bladder is unremarkable. Stomach/Bowel: The stomach is nonenlarged. No dilated small bowel. No colon wall thickening. Negative appendix. Colon diverticular disease without acute inflammatory change. Vascular/Lymphatic: Moderate aortic atherosclerosis. No aneurysm. No significantly enlarged lymph nodes. Reproductive: Enlarged heterogeneous prostate with calcification Other: Negative for free air or free fluid Musculoskeletal: Multiple level degenerative changes of the spine. Chronic compression deformity L1. IMPRESSION: 1. 2.3 cm calcified stone at the gallbladder neck. Suspected gallbladder wall edema versus small amount of pericholecystic fluid. Suggest correlation with ultrasound. 2. Slight interval increase in size of subpleural left lower lobe lung mass now measuring 4.1 cm. 3. Diverticular disease of the colon without acute inflammatory change Electronically Signed: By: [name] M.D. On: 04/15/2018 02:38   Mr Abdomen Mrcp Wo Contrast  Result Date: 04/16/2018 CLINICAL DATA:  85 year old male with history of abdominal pain, concerning for an acute cholecystitis. Additional history of paroxysmal  atrial fibrillation and atrial flutter on Eliquis. Hypertension. COPD. EXAM: MRI ABDOMEN WITHOUT CONTRAST  (INCLUDING MRCP) TECHNIQUE: Multiplanar multisequence MR imaging of the abdomen was performed. Heavily T2-weighted images of the biliary and pancreatic ducts were obtained, and three-dimensional MRCP images were rendered by post processing. COMPARISON:  No priors. FINDINGS: Comment: Today's study is limited for detection and characterization of visceral and/or vascular lesions by lack of IV gadolinium. Lower chest: Unremarkable. Hepatobiliary: Heterogeneous loss of signal intensity throughout the hepatic parenchyma on out of phase dual echo images, indicative of hepatic steatosis. Several small T1 hypointense, T2 hyperintense lesions are noted in the liver, incompletely characterized on today's noncontrast examination, but presumably small cysts and/or biliary hamartomas, largest of which has some thin internal septations and measures up to 12 mm in segment 4A. MRCP images demonstrate very mild intrahepatic biliary ductal dilatation. Common bile duct is also mildly dilated measuring 8 mm in the porta hepatis. In the distal common bile duct there is a 4 mm filling defect (coronal image 25 of series 12), compatible with choledocholithiasis. There may be another filling defect immediately before the ampulla, however, this is difficult to judge secondary to motion. In addition there is a large signal void in the neck of the  gallbladder measuring 2.3 x 1.5 cm, compatible with a gallstone. Some amorphous material lies dependently in the gallbladder and is intermediate intensity on T1 weighted images and slightly low signal intensity on T2 weighted images, compatible with biliary sludge. Gallbladder wall is diffusely thickened and edematous. There is an abnormal appearance of the anteromedial aspect of the wall which is irregular in shape, which may simply reflect a fold, however, the possibility of pending  gallbladder perforation in this region is not excluded (best appreciated on axial image 23 of series 10 and 23 of series 7). Increased T2 signal intensity surrounding the gallbladder, indicative of acute inflammation. Pancreas: No pancreatic mass. No pancreatic ductal dilatation. No pancreatic or peripancreatic fluid or inflammatory changes. Spleen:  Unremarkable. Adrenals/Urinary Tract: Small amount of T2 signal intensity surrounding both kidneys, nonspecific. Unenhanced appearance of the kidneys and bilateral adrenal glands is otherwise unremarkable. Stomach/Bowel: Visualized portions are unremarkable. Vascular/Lymphatic: Aortic atherosclerosis. No aneurysm noted in the abdominal vasculature. Other: Trace amount of pericholecystic fluid. No significant volume of ascites noted in the visualized portions of the peritoneal cavity. Musculoskeletal: No aggressive appearing osseous lesions are noted in the visualized portions of the skeleton. IMPRESSION: 1. Cholelithiasis with evidence of acute cholecystitis. Irregularity of the anteromedial wall of the gallbladder may simply reflect a gallbladder fold, however, the possibility of pending gallbladder perforation is suspected and surgical evaluation is strongly recommended. 2. Choledocholithiasis. Mild intra and extrahepatic biliary ductal dilatation. 3. Heterogeneous hepatic steatosis. 4. Aortic atherosclerosis. These results will be called to the ordering clinician or representative by the Radiologist Assistant, and communication documented in the PACS or zVision Dashboard. Electronically Signed   By: Trudie Reed M.D.   On: 04/16/2018 08:44   Mr 3d Recon At Scanner  Result Date: 04/16/2018 CLINICAL DATA:  85 year old male with history of abdominal pain, concerning for an acute cholecystitis. Additional history of paroxysmal atrial fibrillation and atrial flutter on Eliquis. Hypertension. COPD. EXAM: MRI ABDOMEN WITHOUT CONTRAST  (INCLUDING MRCP) TECHNIQUE:  Multiplanar multisequence MR imaging of the abdomen was performed. Heavily T2-weighted images of the biliary and pancreatic ducts were obtained, and three-dimensional MRCP images were rendered by post processing. COMPARISON:  No priors. FINDINGS: Comment: Today's study is limited for detection and characterization of visceral and/or vascular lesions by lack of IV gadolinium. Lower chest: Unremarkable. Hepatobiliary: Heterogeneous loss of signal intensity throughout the hepatic parenchyma on out of phase dual echo images, indicative of hepatic steatosis. Several small T1 hypointense, T2 hyperintense lesions are noted in the liver, incompletely characterized on today's noncontrast examination, but presumably small cysts and/or biliary hamartomas, largest of which has some thin internal septations and measures up to 12 mm in segment 4A. MRCP images demonstrate very mild intrahepatic biliary ductal dilatation. Common bile duct is also mildly dilated measuring 8 mm in the porta hepatis. In the distal common bile duct there is a 4 mm filling defect (coronal image 25 of series 12), compatible with choledocholithiasis. There may be another filling defect immediately before the ampulla, however, this is difficult to judge secondary to motion. In addition there is a large signal void in the neck of the gallbladder measuring 2.3 x 1.5 cm, compatible with a gallstone. Some amorphous material lies dependently in the gallbladder and is intermediate intensity on T1 weighted images and slightly low signal intensity on T2 weighted images, compatible with biliary sludge. Gallbladder wall is diffusely thickened and edematous. There is an abnormal appearance of the anteromedial aspect of the wall which is irregular in shape,  which may simply reflect a fold, however, the possibility of pending gallbladder perforation in this region is not excluded (best appreciated on axial image 23 of series 10 and 23 of series 7). Increased T2 signal  intensity surrounding the gallbladder, indicative of acute inflammation. Pancreas: No pancreatic mass. No pancreatic ductal dilatation. No pancreatic or peripancreatic fluid or inflammatory changes. Spleen:  Unremarkable. Adrenals/Urinary Tract: Small amount of T2 signal intensity surrounding both kidneys, nonspecific. Unenhanced appearance of the kidneys and bilateral adrenal glands is otherwise unremarkable. Stomach/Bowel: Visualized portions are unremarkable. Vascular/Lymphatic: Aortic atherosclerosis. No aneurysm noted in the abdominal vasculature. Other: Trace amount of pericholecystic fluid. No significant volume of ascites noted in the visualized portions of the peritoneal cavity. Musculoskeletal: No aggressive appearing osseous lesions are noted in the visualized portions of the skeleton. IMPRESSION: 1. Cholelithiasis with evidence of acute cholecystitis. Irregularity of the anteromedial wall of the gallbladder may simply reflect a gallbladder fold, however, the possibility of pending gallbladder perforation is suspected and surgical evaluation is strongly recommended. 2. Choledocholithiasis. Mild intra and extrahepatic biliary ductal dilatation. 3. Heterogeneous hepatic steatosis. 4. Aortic atherosclerosis. These results will be called to the ordering clinician or representative by the Radiologist Assistant, and communication documented in the PACS or zVision Dashboard. Electronically Signed   By: Trudie Reedaniel  Entrikin M.D.   On: 04/16/2018 08:44   [place] Abdomen Limited  Result Date: 04/15/2018 CLINICAL DATA:  Jaundice EXAM: ULTRASOUND ABDOMEN LIMITED RIGHT UPPER QUADRANT COMPARISON:  CT 04/15/2018 FINDINGS: Gallbladder: Shadowing stone within the gallbladder neck measuring 1.9 cm. Increased wall thickness of 8.8 mm with trace pericholecystic fluid. Negative sonographic Murphy. Small amount of intraluminal sludge. Common bile duct: Diameter: 4.9 mm Liver: Heterogeneous echotexture without focal hepatic  abnormality. Portal vein is patent on color Doppler imaging with normal direction of blood flow towards the liver. IMPRESSION: 1. Gallstone with increased wall thickness and small amount of pericholecystic fluid, raising concern for cholecystitis despite negative sonographic Murphy. Electronically Signed   By: Jasmine PangKim  Fujinaga M.D.   On: 04/15/2018 03:39   Dg Ercp Biliary & Pancreatic Ducts  Result Date: 04/22/2018 CLINICAL DATA:  Choledocholithiasis EXAM: ERCP with sphincterotomy and balloon sweep for stone removal TECHNIQUE: Multiple spot images obtained with the fluoroscopic device and submitted for interpretation post-procedure. FLUOROSCOPY TIME:  Fluoroscopy Time:  2 minutes 34 seconds Radiation Exposure Index (if provided by the fluoroscopic device): Not available Number of Acquired Spot Images: 4 COMPARISON:  04/15/2018 FINDINGS: Retrograde cholangiogram performed during the ERCP. Previous cholecystectomy noted. Mild biliary dilatation. Balloon sweep performed for stone removal. No occlusive filling defect. No focal narrowing or stricture. IMPRESSION: Limited cholangiogram during sphincterotomy and balloon sweep for CBD stone removal as above. These images were submitted for radiologic interpretation only. Please see the procedural report for the amount of contrast and the fluoroscopy time utilized. Electronically Signed   By: Judie PetitM.  Shick M.D.   On: 04/22/2018 10:30   Dg Abd Portable 1v  Result Date: 04/18/2018 CLINICAL DATA:  Abdominal pain for several days EXAM: PORTABLE ABDOMEN - 1 VIEW COMPARISON:  04/17/2018 FINDINGS: Scattered large and small bowel gas is noted. Previously seen contrast is again noted within the colon. Mildly prominent loops of small bowel are again noted and stable from the previous exam. No free air is noted. No other focal abnormality is seen. IMPRESSION: Bowel-gas pattern stable from the prior exam consistent with small bowel ileus. Electronically Signed   By: Alcide CleverMark  Lukens M.D.    On: 04/18/2018 10:23   Dg Abd  Portable 1v  Result Date: 04/17/2018 CLINICAL DATA:  Ileus. EXAM: PORTABLE ABDOMEN - 1 VIEW COMPARISON:  04/15/2018 FINDINGS: Two supine views of the abdomen and pelvis. Gas-filled small bowel loops of up to 2.6 cm. Gas and contrast within normal caliber colon. No gross free intraperitoneal air. IMPRESSION: Upper normal caliber gas-filled loops of small bowel, favoring mild adynamic ileus. Electronically Signed   By: Jeronimo Greaves M.D.   On: 04/17/2018 13:07    Laparoscopic cholecystectomy on 04/16/2018 EUS on 04/22/2018 Impression: - Evidence of a cholecystectomy. - One stone was visualized endosonographically in  the common bile duct. - There was no sign of significant pathology in the  pancreatic head and pancreatic body. - No specimens collected. Moderate Sedation: None Recommendation: - Perform an ERCP today by Dr. Ewing Schlein for biliary  sphincterotomy and stone extraction. - Case discussed with Dr. Ewing Schlein.  ERCP on 04/22/2018 Findings: The major papilla was normal. Biliary sphincterotomy was made with a  Hydratome sphincterotome using ERBE electrocautery. There was no  post-sphincterotomy bleeding. Choledocholithiasis was found in a  nondilated duct. To discover objects, the biliary tree was swept with a  12 mm balloon starting at the bifurcation. One stone was removed. No  stones remained after mulktiple pull thru and neg occlusion cholangiogr. Impression: - The major papilla appeared normal. - Choledocholithiasis was found. Complete removal  was accomplished by biliary sphincterotomy and  balloon  extraction. - A biliary sphincterotomy was performed. - The biliary tree was swept. Recommendation: - Clear liquid diet for 6 hours. slowly adv as  tolerated - Continue present medications. - Return to GI clinic PRN. - Telephone GI clinic if symptomatic PRN.   Subjective: Patient seen and examined at bedside.  He denies worsening abdominal pain, nausea or vomiting.  Is tolerating diet.  He wants to go home.  Discharge Exam: Vitals:   04/22/18 2226 04/23/18 0612  BP: (!) 148/73 (!) 145/74  Pulse: (!) 103 (!) 104  Resp: 15 16  Temp: 98.9 F (37.2 C) 99.2 F (37.3 C)  SpO2: 95% 99%   Vitals:   04/22/18 1100 04/22/18 1509 04/22/18 2226 04/23/18 0612  BP: 130/68 (!) 168/82 (!) 148/73 (!) 145/74  Pulse: 67 94 (!) 103 (!) 104  Resp: Temp:  98.3 F (36.8 C) 98.9 F (37.2 C) 99.2 F (37.3 C)  TempSrc:  Oral Oral Oral  SpO2: 96% 97% 95% 99%  Weight:      Height:        General: Pt is alert, awake, not in acute distress.  Elderly male lying in bed. Cardiovascular: Slightly tachycardic, S1/S2 + Respiratory: bilateral decreased breath sounds at bases Abdominal: Soft, NT, ND, bowel sounds + Extremities: no edema, no cyanosis    The results of significant diagnostics from this hospitalization (including imaging, microbiology, ancillary and laboratory) are listed below for reference.     Microbiology: Recent Results (from the past 240 hour(s))  Culture, Urine     Status: None   Collection Time: 04/16/18  8:50 AM  Result Value Ref Range Status   Specimen Description URINE, CLEAN CATCH  Final   Special Requests NONE  Final   Culture   Final    NO GROWTH Performed at [facility], [address], [place], [place] [address]    Report Status 04/17/2018 FINAL  Final  Surgical pcr screen      Status: None   Collection Time: 04/16/18  9:35 AM  Result Value Ref Range  Status   MRSA, PCR NEGATIVE NEGATIVE Final   Staphylococcus aureus NEGATIVE NEGATIVE Final    Comment: (NOTE) The Xpert SA Assay (FDA approved for NASAL specimens in patients 37 years of age and older), is one component of a comprehensive surveillance program. It is not intended to diagnose infection nor to guide or monitor treatment. Performed at [facility], [address], [place], [place] [address]      Labs: BNP (last 3 results) No results for input(s): BNP in the last 8760 hours. Basic Metabolic Panel: Recent Labs  Lab 04/18/18 0748 04/19/18 0410 04/20/18 0336 04/21/18 0329 04/22/18 0336 04/23/18 0420  NA 139 141 138  --  138 137  K 4.3 4.2 4.2  --  3.9 3.8  CL 113* 113* 111  --  107 106  CO2 20* 22 20*  --  23 22  GLUCOSE 129* 125* 126*  --  101* 112*  BUN 24* 19 14  --  13 17  CREATININE 1.57* 1.56* 1.43*  --  1.41* 1.43*  CALCIUM 7.9* 7.9* 8.1*  --  8.2* 8.4*  MG 2.0  --  1.7 1.9 1.7 1.7   Liver Function Tests: Recent Labs  Lab 04/18/18 0748 04/19/18 0410 04/20/18 0336 04/22/18 0336 04/23/18 0420  AST 61* 45* 42* 31 30  ALT 109* 93* 81* 60* 54*  ALKPHOS 153* 165* 179* 152* 147*  BILITOT 1.6* 1.6* 1.6* 1.3* 1.4*  PROT 5.7* 5.8* 5.6* 5.8* 5.7*  ALBUMIN 2.4* 2.3* 2.2* 2.3* 2.3*   No results for input(s): LIPASE, AMYLASE in the last 168 hours. No results for input(s): AMMONIA in the last 168 hours. CBC: Recent Labs  Lab 04/18/18 0748 04/19/18 0410 04/20/18 0336 04/22/18 0336 04/23/18 0420  WBC 10.4 9.3 10.0 11.1* 12.2*  NEUTROABS  --   --   --  8.1* 9.0*  HGB 10.2* 10.4* 10.1* 10.8* 11.4*  HCT 30.5* 32.3* 30.6* 31.7* 33.1*  MCV 93.6 94.4 93.9 91.9 91.7  PLT 176 193 209 254 304   Cardiac Enzymes: No results for input(s): CKTOTAL, CKMB, CKMBINDEX, TROPONINI in the last 168 hours. BNP: Invalid input(s): POCBNP CBG: No results for input(s): GLUCAP in the last 168  hours. D-Dimer No results for input(s): DDIMER in the last 72 hours. Hgb A1c No results for input(s): HGBA1C in the last 72 hours. Lipid Profile No results for input(s): CHOL, HDL, LDLCALC, TRIG, CHOLHDL, LDLDIRECT in the last 72 hours. Thyroid function studies No results for input(s): TSH, T4TOTAL, T3FREE, THYROIDAB in the last 72 hours.  Invalid input(s): FREET3 Anemia work up No results for input(s): VITAMINB12, FOLATE, FERRITIN, TIBC, IRON, RETICCTPCT in the last 72 hours. Urinalysis    Component Value Date/Time   COLORURINE YELLOW 04/15/2018 0356   APPEARANCEUR HAZY (A) 04/15/2018 0356   LABSPEC 1.040 (H) 04/15/2018 0356   PHURINE 5.0 04/15/2018 0356   GLUCOSEU NEGATIVE 04/15/2018 0356   HGBUR SMALL (A) 04/15/2018 0356   BILIRUBINUR NEGATIVE 04/15/2018 0356   KETONESUR 5 (A) 04/15/2018 0356   PROTEINUR 30 (A) 04/15/2018 0356   NITRITE NEGATIVE 04/15/2018 0356   LEUKOCYTESUR MODERATE (A) 04/15/2018 0356   Sepsis Labs Invalid input(s): PROCALCITONIN,  WBC,  LACTICIDVEN Microbiology Recent Results (from the past 240 hour(s))  Culture, Urine     Status: None   Collection Time: 04/16/18  8:50 AM  Result Value Ref Range Status   Specimen Description URINE, CLEAN CATCH  Final   Special Requests NONE  Final   Culture  Final    NO GROWTH Performed at [facility], [address], [place], [place] [address]    Report Status 04/17/2018 FINAL  Final  Surgical pcr screen     Status: None   Collection Time: 04/16/18  9:35 AM  Result Value Ref Range Status   MRSA, PCR NEGATIVE NEGATIVE Final   Staphylococcus aureus NEGATIVE NEGATIVE Final    Comment: (NOTE) The Xpert SA Assay (FDA approved for NASAL specimens in patients 71 years of age and older), is one component of a comprehensive surveillance program. It is not intended to diagnose infection nor to guide or monitor treatment. Performed at [facility], [address], [place], [place] [address]       Time coordinating discharge: 35 minutes  SIGNED:   Glade Lloyd, MD  [facility] 04/23/2018, 9:07 AM

## 2018-04-23 NOTE — Progress Notes (Signed)
Rodman Key 10:27 AM  Subjective: Patient doing well status post ERCP just complains of constipation and wants to go home and has minimal abdominal cramps no other specific complaints Case discussed with the hospital team  Objective: Signs stable afebrile no acute distress abdomen is soft nontender good bowel sounds labs stable  Assessment: Status post ERCP  Plan: Okay with me to go home and happy to see back in the office as needed  [facility]  Pager [phone] After 5PM or if no answer call [phone]

## 2018-04-24 ENCOUNTER — Encounter (HOSPITAL_COMMUNITY): Payer: Self-pay | Admitting: Gastroenterology

## 2018-04-24 ENCOUNTER — Other Ambulatory Visit: Payer: Self-pay | Admitting: Internal Medicine

## 2018-04-26 ENCOUNTER — Emergency Department (HOSPITAL_COMMUNITY): Payer: Medicare Other

## 2018-04-26 ENCOUNTER — Encounter (HOSPITAL_COMMUNITY): Payer: Self-pay | Admitting: Emergency Medicine

## 2018-04-26 ENCOUNTER — Inpatient Hospital Stay (HOSPITAL_COMMUNITY)
Admission: EM | Admit: 2018-04-26 | Discharge: 2018-05-06 | DRG: 862 | Disposition: A | Discharge status: Home Health | Payer: Medicare Other | Attending: Internal Medicine | Admitting: Internal Medicine

## 2018-04-26 ENCOUNTER — Other Ambulatory Visit: Payer: Self-pay

## 2018-04-26 DIAGNOSIS — E440 Moderate protein-calorie malnutrition: Secondary | ICD-10-CM | POA: Diagnosis not present

## 2018-04-26 DIAGNOSIS — J189 Pneumonia, unspecified organism: Secondary | ICD-10-CM

## 2018-04-26 DIAGNOSIS — J156 Pneumonia due to other aerobic Gram-negative bacteria: Secondary | ICD-10-CM | POA: Diagnosis not present

## 2018-04-26 DIAGNOSIS — Z79899 Other long term (current) drug therapy: Secondary | ICD-10-CM

## 2018-04-26 DIAGNOSIS — R140 Abdominal distension (gaseous): Secondary | ICD-10-CM

## 2018-04-26 DIAGNOSIS — J9690 Respiratory failure, unspecified, unspecified whether with hypoxia or hypercapnia: Secondary | ICD-10-CM

## 2018-04-26 DIAGNOSIS — R918 Other nonspecific abnormal finding of lung field: Secondary | ICD-10-CM | POA: Diagnosis present

## 2018-04-26 DIAGNOSIS — T8149XA Infection following a procedure, other surgical site, initial encounter: Secondary | ICD-10-CM | POA: Diagnosis not present

## 2018-04-26 DIAGNOSIS — T8143XA Infection following a procedure, organ and space surgical site, initial encounter: Principal | ICD-10-CM | POA: Diagnosis present

## 2018-04-26 DIAGNOSIS — N5089 Other specified disorders of the male genital organs: Secondary | ICD-10-CM | POA: Diagnosis present

## 2018-04-26 DIAGNOSIS — R509 Fever, unspecified: Secondary | ICD-10-CM

## 2018-04-26 DIAGNOSIS — B961 Klebsiella pneumoniae [K. pneumoniae] as the cause of diseases classified elsewhere: Secondary | ICD-10-CM | POA: Diagnosis present

## 2018-04-26 DIAGNOSIS — R188 Other ascites: Secondary | ICD-10-CM

## 2018-04-26 DIAGNOSIS — Z978 Presence of other specified devices: Secondary | ICD-10-CM

## 2018-04-26 DIAGNOSIS — J9600 Acute respiratory failure, unspecified whether with hypoxia or hypercapnia: Secondary | ICD-10-CM

## 2018-04-26 DIAGNOSIS — N390 Urinary tract infection, site not specified: Secondary | ICD-10-CM | POA: Diagnosis not present

## 2018-04-26 DIAGNOSIS — N183 Chronic kidney disease, stage 3 (moderate): Secondary | ICD-10-CM | POA: Diagnosis not present

## 2018-04-26 DIAGNOSIS — K839 Disease of biliary tract, unspecified: Secondary | ICD-10-CM | POA: Diagnosis not present

## 2018-04-26 DIAGNOSIS — I129 Hypertensive chronic kidney disease with stage 1 through stage 4 chronic kidney disease, or unspecified chronic kidney disease: Secondary | ICD-10-CM | POA: Diagnosis present

## 2018-04-26 DIAGNOSIS — K651 Peritoneal abscess: Secondary | ICD-10-CM | POA: Diagnosis present

## 2018-04-26 DIAGNOSIS — G9341 Metabolic encephalopathy: Secondary | ICD-10-CM | POA: Diagnosis not present

## 2018-04-26 DIAGNOSIS — I248 Other forms of acute ischemic heart disease: Secondary | ICD-10-CM | POA: Diagnosis not present

## 2018-04-26 DIAGNOSIS — K659 Peritonitis, unspecified: Secondary | ICD-10-CM | POA: Diagnosis not present

## 2018-04-26 DIAGNOSIS — J449 Chronic obstructive pulmonary disease, unspecified: Secondary | ICD-10-CM | POA: Diagnosis present

## 2018-04-26 DIAGNOSIS — N400 Enlarged prostate without lower urinary tract symptoms: Secondary | ICD-10-CM | POA: Diagnosis present

## 2018-04-26 DIAGNOSIS — J150 Pneumonia due to Klebsiella pneumoniae: Secondary | ICD-10-CM | POA: Diagnosis present

## 2018-04-26 DIAGNOSIS — I480 Paroxysmal atrial fibrillation: Secondary | ICD-10-CM | POA: Diagnosis present

## 2018-04-26 DIAGNOSIS — E870 Hyperosmolality and hypernatremia: Secondary | ICD-10-CM | POA: Diagnosis not present

## 2018-04-26 DIAGNOSIS — K7689 Other specified diseases of liver: Secondary | ICD-10-CM | POA: Diagnosis present

## 2018-04-26 DIAGNOSIS — K810 Acute cholecystitis: Secondary | ICD-10-CM | POA: Diagnosis not present

## 2018-04-26 DIAGNOSIS — R109 Unspecified abdominal pain: Secondary | ICD-10-CM

## 2018-04-26 DIAGNOSIS — J440 Chronic obstructive pulmonary disease with acute lower respiratory infection: Secondary | ICD-10-CM | POA: Diagnosis not present

## 2018-04-26 DIAGNOSIS — Z4659 Encounter for fitting and adjustment of other gastrointestinal appliance and device: Secondary | ICD-10-CM

## 2018-04-26 DIAGNOSIS — J690 Pneumonitis due to inhalation of food and vomit: Secondary | ICD-10-CM | POA: Diagnosis not present

## 2018-04-26 DIAGNOSIS — D6489 Other specified anemias: Secondary | ICD-10-CM | POA: Diagnosis not present

## 2018-04-26 DIAGNOSIS — Q211 Atrial septal defect: Secondary | ICD-10-CM | POA: Diagnosis not present

## 2018-04-26 DIAGNOSIS — A419 Sepsis, unspecified organism: Secondary | ICD-10-CM | POA: Diagnosis present

## 2018-04-26 DIAGNOSIS — Z7901 Long term (current) use of anticoagulants: Secondary | ICD-10-CM

## 2018-04-26 DIAGNOSIS — E872 Acidosis: Secondary | ICD-10-CM | POA: Diagnosis present

## 2018-04-26 DIAGNOSIS — N179 Acute kidney failure, unspecified: Secondary | ICD-10-CM | POA: Diagnosis not present

## 2018-04-26 DIAGNOSIS — E875 Hyperkalemia: Secondary | ICD-10-CM | POA: Diagnosis present

## 2018-04-26 DIAGNOSIS — J90 Pleural effusion, not elsewhere classified: Secondary | ICD-10-CM | POA: Diagnosis not present

## 2018-04-26 DIAGNOSIS — E876 Hypokalemia: Secondary | ICD-10-CM | POA: Diagnosis present

## 2018-04-26 DIAGNOSIS — K8050 Calculus of bile duct without cholangitis or cholecystitis without obstruction: Secondary | ICD-10-CM

## 2018-04-26 DIAGNOSIS — E8770 Fluid overload, unspecified: Secondary | ICD-10-CM | POA: Diagnosis not present

## 2018-04-26 DIAGNOSIS — N170 Acute kidney failure with tubular necrosis: Secondary | ICD-10-CM | POA: Diagnosis not present

## 2018-04-26 DIAGNOSIS — K653 Choleperitonitis: Secondary | ICD-10-CM | POA: Diagnosis present

## 2018-04-26 DIAGNOSIS — Z7189 Other specified counseling: Secondary | ICD-10-CM

## 2018-04-26 DIAGNOSIS — J9601 Acute respiratory failure with hypoxia: Secondary | ICD-10-CM | POA: Diagnosis present

## 2018-04-26 DIAGNOSIS — R6521 Severe sepsis with septic shock: Secondary | ICD-10-CM | POA: Diagnosis present

## 2018-04-26 DIAGNOSIS — D689 Coagulation defect, unspecified: Secondary | ICD-10-CM | POA: Diagnosis present

## 2018-04-26 DIAGNOSIS — Z9049 Acquired absence of other specified parts of digestive tract: Secondary | ICD-10-CM

## 2018-04-26 DIAGNOSIS — L8990 Pressure ulcer of unspecified site, unspecified stage: Secondary | ICD-10-CM | POA: Diagnosis present

## 2018-04-26 DIAGNOSIS — Z66 Do not resuscitate: Secondary | ICD-10-CM | POA: Diagnosis present

## 2018-04-26 DIAGNOSIS — I4892 Unspecified atrial flutter: Secondary | ICD-10-CM | POA: Diagnosis not present

## 2018-04-26 DIAGNOSIS — Z09 Encounter for follow-up examination after completed treatment for conditions other than malignant neoplasm: Secondary | ICD-10-CM

## 2018-04-26 DIAGNOSIS — T8141XA Infection following a procedure, superficial incisional surgical site, initial encounter: Secondary | ICD-10-CM | POA: Diagnosis present

## 2018-04-26 DIAGNOSIS — J441 Chronic obstructive pulmonary disease with (acute) exacerbation: Secondary | ICD-10-CM | POA: Diagnosis present

## 2018-04-26 DIAGNOSIS — K8020 Calculus of gallbladder without cholecystitis without obstruction: Secondary | ICD-10-CM

## 2018-04-26 DIAGNOSIS — E860 Dehydration: Secondary | ICD-10-CM | POA: Diagnosis present

## 2018-04-26 DIAGNOSIS — I959 Hypotension, unspecified: Secondary | ICD-10-CM | POA: Diagnosis not present

## 2018-04-26 DIAGNOSIS — Z79891 Long term (current) use of opiate analgesic: Secondary | ICD-10-CM

## 2018-04-26 DIAGNOSIS — Z87891 Personal history of nicotine dependence: Secondary | ICD-10-CM

## 2018-04-26 DIAGNOSIS — R1011 Right upper quadrant pain: Secondary | ICD-10-CM | POA: Diagnosis not present

## 2018-04-26 DIAGNOSIS — K567 Ileus, unspecified: Secondary | ICD-10-CM

## 2018-04-26 DIAGNOSIS — Z515 Encounter for palliative care: Secondary | ICD-10-CM | POA: Diagnosis present

## 2018-04-26 DIAGNOSIS — R0600 Dyspnea, unspecified: Secondary | ICD-10-CM

## 2018-04-26 LAB — CBC WITH DIFFERENTIAL/PLATELET
ABS IMMATURE GRANULOCYTES: 0.15 10*3/uL — AB (ref 0.00–0.07)
Basophils Absolute: 0 10*3/uL (ref 0.0–0.1)
Basophils Relative: 0 %
Eosinophils Absolute: 0 10*3/uL (ref 0.0–0.5)
Eosinophils Relative: 0 %
HCT: 33.3 % — ABNORMAL LOW (ref 39.0–52.0)
Hemoglobin: 11.2 g/dL — ABNORMAL LOW (ref 13.0–17.0)
Immature Granulocytes: 1 %
Lymphocytes Relative: 3 %
Lymphs Abs: 0.6 10*3/uL — ABNORMAL LOW (ref 0.7–4.0)
MCH: 31.5 pg (ref 26.0–34.0)
MCHC: 33.6 g/dL (ref 30.0–36.0)
MCV: 93.8 fL (ref 80.0–100.0)
MONOS PCT: 8 %
Monocytes Absolute: 1.6 10*3/uL — ABNORMAL HIGH (ref 0.1–1.0)
Neutro Abs: 17.5 10*3/uL — ABNORMAL HIGH (ref 1.7–7.7)
Neutrophils Relative %: 88 %
Platelets: 408 10*3/uL — ABNORMAL HIGH (ref 150–400)
RBC: 3.55 MIL/uL — ABNORMAL LOW (ref 4.22–5.81)
RDW: 12.4 % (ref 11.5–15.5)
WBC: 19.9 10*3/uL — ABNORMAL HIGH (ref 4.0–10.5)
nRBC: 0 % (ref 0.0–0.2)

## 2018-04-26 LAB — PROTIME-INR
INR: 2.2 — ABNORMAL HIGH (ref 0.8–1.2)
Prothrombin Time: 23.7 seconds — ABNORMAL HIGH (ref 11.4–15.2)

## 2018-04-26 LAB — URINALYSIS, ROUTINE W REFLEX MICROSCOPIC
Bilirubin Urine: NEGATIVE
Glucose, UA: NEGATIVE mg/dL
HGB URINE DIPSTICK: NEGATIVE
Ketones, ur: 20 mg/dL — AB
Leukocytes,Ua: NEGATIVE
Nitrite: NEGATIVE
Protein, ur: NEGATIVE mg/dL
Specific Gravity, Urine: 1.015 (ref 1.005–1.030)
pH: 5 (ref 5.0–8.0)

## 2018-04-26 LAB — COMPREHENSIVE METABOLIC PANEL
ALT: 58 U/L — ABNORMAL HIGH (ref 0–44)
ANION GAP: 12 (ref 5–15)
AST: 53 U/L — ABNORMAL HIGH (ref 15–41)
Albumin: 2.8 g/dL — ABNORMAL LOW (ref 3.5–5.0)
Alkaline Phosphatase: 159 U/L — ABNORMAL HIGH (ref 38–126)
BUN: 21 mg/dL (ref 8–23)
CO2: 24 mmol/L (ref 22–32)
Calcium: 8.2 mg/dL — ABNORMAL LOW (ref 8.9–10.3)
Chloride: 99 mmol/L (ref 98–111)
Creatinine, Ser: 1.42 mg/dL — ABNORMAL HIGH (ref 0.61–1.24)
GFR calc Af Amer: 50 mL/min — ABNORMAL LOW (ref >60)
GFR calc non Af Amer: 43 mL/min — ABNORMAL LOW (ref >60)
Glucose, Bld: 118 mg/dL — ABNORMAL HIGH (ref 70–99)
Potassium: 2.9 mmol/L — ABNORMAL LOW (ref 3.5–5.1)
Sodium: 135 mmol/L (ref 135–145)
Total Bilirubin: 1.5 mg/dL — ABNORMAL HIGH (ref 0.3–1.2)
Total Protein: 6.8 g/dL (ref 6.5–8.1)

## 2018-04-26 LAB — LIPASE, BLOOD: LIPASE: 35 U/L (ref 11–51)

## 2018-04-26 LAB — TROPONIN I
TROPONIN I: 0.06 ng/mL — AB (ref <0.03)
Troponin I: 0.1 ng/mL (ref <0.03)

## 2018-04-26 LAB — LACTIC ACID, PLASMA
LACTIC ACID, VENOUS: 1 mmol/L (ref 0.5–1.9)
Lactic Acid, Venous: 1.5 mmol/L (ref 0.5–1.9)

## 2018-04-26 LAB — INFLUENZA PANEL BY PCR (TYPE A & B)
INFLAPCR: NEGATIVE
Influenza B By PCR: NEGATIVE

## 2018-04-26 LAB — MAGNESIUM: Magnesium: 2 mg/dL (ref 1.7–2.4)

## 2018-04-26 MED ORDER — SODIUM CHLORIDE 0.9 % IV SOLN
2.0000 g | Freq: Once | INTRAVENOUS | Status: AC
  Administered 2018-04-26: 2 g via INTRAVENOUS
  Filled 2018-04-26: qty 2

## 2018-04-26 MED ORDER — VANCOMYCIN HCL IN DEXTROSE 750-5 MG/150ML-% IV SOLN: 750.0000 mg | INTRAVENOUS | Status: DC

## 2018-04-26 MED ORDER — POTASSIUM CHLORIDE IN NACL 40-0.9 MEQ/L-% IV SOLN
INTRAVENOUS | Status: AC
  Administered 2018-04-26: 50 mL/h via INTRAVENOUS
  Filled 2018-04-26 (×3): qty 1000

## 2018-04-26 MED ORDER — VANCOMYCIN HCL IN DEXTROSE 1-5 GM/200ML-% IV SOLN: 1000.0000 mg | Freq: Once | INTRAVENOUS | Status: DC

## 2018-04-26 MED ORDER — ACETAMINOPHEN 650 MG RE SUPP
RECTAL | Status: AC
  Filled 2018-04-26: qty 1

## 2018-04-26 MED ORDER — METRONIDAZOLE IN NACL 5-0.79 MG/ML-% IV SOLN
500.0000 mg | Freq: Three times a day (TID) | INTRAVENOUS | Status: DC
  Administered 2018-04-27 – 2018-05-02 (×16): 500 mg via INTRAVENOUS
  Filled 2018-04-26 (×15): qty 100

## 2018-04-26 MED ORDER — SODIUM CHLORIDE 0.9 % IV BOLUS
500.0000 mL | Freq: Once | INTRAVENOUS | Status: AC
  Administered 2018-04-26: 500 mL via INTRAVENOUS

## 2018-04-26 MED ORDER — POTASSIUM CHLORIDE 10 MEQ/100ML IV SOLN
10.0000 meq | INTRAVENOUS | Status: AC
  Administered 2018-04-26 (×2): 10 meq via INTRAVENOUS
  Filled 2018-04-26 (×2): qty 100

## 2018-04-26 MED ORDER — ONDANSETRON HCL 4 MG PO TABS: 4.0000 mg | ORAL_TABLET | Freq: Four times a day (QID) | ORAL | Status: DC | PRN

## 2018-04-26 MED ORDER — ONDANSETRON HCL 4 MG/2ML IJ SOLN
4.0000 mg | Freq: Four times a day (QID) | INTRAMUSCULAR | Status: DC | PRN
  Administered 2018-04-27: 4 mg via INTRAVENOUS
  Filled 2018-04-26: qty 2

## 2018-04-26 MED ORDER — SODIUM CHLORIDE 0.9 % IV SOLN
INTRAVENOUS | Status: DC
  Administered 2018-04-26: 15:00:00 via INTRAVENOUS

## 2018-04-26 MED ORDER — IPRATROPIUM-ALBUTEROL 0.5-2.5 (3) MG/3ML IN SOLN
RESPIRATORY_TRACT | Status: AC
  Administered 2018-04-26: 3 mL
  Filled 2018-04-26: qty 3

## 2018-04-26 MED ORDER — ACETAMINOPHEN 325 MG PO TABS: 650.0000 mg | ORAL_TABLET | Freq: Four times a day (QID) | ORAL | Status: DC | PRN

## 2018-04-26 MED ORDER — MORPHINE SULFATE (PF) 2 MG/ML IV SOLN
INTRAVENOUS | Status: AC
  Administered 2018-04-28: 2 mg via INTRAVENOUS
  Filled 2018-04-26: qty 1

## 2018-04-26 MED ORDER — ACETAMINOPHEN 650 MG RE SUPP: 650.0000 mg | Freq: Four times a day (QID) | RECTAL | Status: DC | PRN

## 2018-04-26 MED ORDER — SODIUM CHLORIDE 0.9 % IV SOLN
2.0000 g | INTRAVENOUS | Status: DC
  Filled 2018-04-26: qty 2

## 2018-04-26 MED ORDER — IPRATROPIUM-ALBUTEROL 0.5-2.5 (3) MG/3ML IN SOLN
3.0000 mL | RESPIRATORY_TRACT | Status: DC | PRN
  Administered 2018-04-27: 3 mL via RESPIRATORY_TRACT
  Filled 2018-04-26: qty 3

## 2018-04-26 MED ORDER — METRONIDAZOLE IN NACL 5-0.79 MG/ML-% IV SOLN
500.0000 mg | Freq: Once | INTRAVENOUS | Status: AC
  Administered 2018-04-26: 500 mg via INTRAVENOUS
  Filled 2018-04-26: qty 100

## 2018-04-26 MED ORDER — METOPROLOL TARTRATE 5 MG/5ML IV SOLN
5.0000 mg | Freq: Four times a day (QID) | INTRAVENOUS | Status: DC
  Administered 2018-04-27 (×2): 5 mg via INTRAVENOUS
  Filled 2018-04-26 (×3): qty 5

## 2018-04-26 MED ORDER — VANCOMYCIN HCL 10 G IV SOLR
1500.0000 mg | Freq: Once | INTRAVENOUS | Status: AC
  Administered 2018-04-26: 1500 mg via INTRAVENOUS
  Filled 2018-04-26: qty 1500

## 2018-04-26 MED ORDER — MORPHINE SULFATE (PF) 2 MG/ML IV SOLN
2.0000 mg | INTRAVENOUS | Status: DC | PRN
  Administered 2018-04-26 – 2018-04-29 (×4): 2 mg via INTRAVENOUS
  Filled 2018-04-26 (×3): qty 1

## 2018-04-26 MED ORDER — ACETAMINOPHEN 650 MG RE SUPP
650.0000 mg | Freq: Once | RECTAL | Status: AC
  Administered 2018-04-26: 650 mg via RECTAL
  Filled 2018-04-26: qty 1

## 2018-04-26 MED ORDER — ACETAMINOPHEN 650 MG RE SUPP
650.0000 mg | Freq: Once | RECTAL | Status: AC
  Administered 2018-04-26: 650 mg via RECTAL

## 2018-04-26 NOTE — Progress Notes (Signed)
Pharmacy Antibiotic Note  Vincent Forsberg is a 85 y.o. male admitted on 04/26/2018 with infection- unknown source.  Pharmacy has been consulted for Vancomycin and Cefepime dosing.  Plan: Vancomycin 1500 mg IV x 1 dose. Vancomycin 750 mg IV every 24 hours.  Goal trough 15-20 mcg/mL.  Cefepime 2000 mg IV every 24 hours. Flagyl 500 mg IV every 8 hours. Monitor labs, c/s, and vanco levels as indicated.     Temp (24hrs), Avg:101.3 F (38.5 C), Min:99.1 F (37.3 C), Max:103.5 F (39.7 C)  Recent Labs  Lab 04/20/18 0336 04/22/18 0336 04/23/18 0420 04/26/18 1508  WBC 10.0 11.1* 12.2* 19.9*  CREATININE 1.43* 1.41* 1.43* 1.42*  LATICACIDVEN  --   --   --  1.5    Estimated Creatinine Clearance: 31.6 mL/min (A) (by C-G formula based on SCr of 1.42 mg/dL (H)).    Allergies  Allergen Reactions  . Quinine Nausea And Vomiting    Antimicrobials this admission: Vanco 3/16 >>  Cefepime 3/16 >>  Flagyl 3/16 >>  Dose adjustments this admission: Vanco/Cefepime  Microbiology results: 3/16 BCx: pending 3/16 UCx: pending    Thank you for allowing pharmacy to be a part of this patient's care.  Tad Moore 04/26/2018 4:37 PM

## 2018-04-26 NOTE — Consult Note (Signed)
Reason for Consult:post-op abscess Referring Physician: [name] is an 85 y.o. male.  HPI: Patient is known to our service status post laparoscopic cholecystectomy with intraoperative cholangiogram on 04/16/2018 by Dr. Corliss Skains.  He subsequently underwent ERCP by Dr. Ewing Schlein on 04/22/2018.  He was discharged from the hospital last Friday.  Initially he did well.  He developed increasing right upper quadrant abdominal pain and a productive cough with wheezing.  He went to the [facility] emergency department.  Work-up there included CT scan of the abdomen and pelvis which reveals a fluid collection in his gallbladder fossa suspicious for abscess.  He was transferred back to [facility] for admission.  Past Medical History:  Diagnosis Date  . BPH (benign prostatic hyperplasia)   . COPD (chronic obstructive pulmonary disease) (HCC)   . Hypertension   . OA (osteoarthritis)   . PAF (paroxysmal atrial fibrillation) (HCC)     Past Surgical History:  Procedure Laterality Date  . CARDIOVERSION N/A 03/31/2017   Procedure: CARDIOVERSION;  Surgeon: Lars Masson, MD;  Location: [facility] ENDOSCOPY;  Service: Cardiovascular;  Laterality: N/A;  . CARPAL TUNNEL RELEASE  02/2010   Dr. Franky Macho  . CHOLECYSTECTOMY N/A 04/16/2018   Procedure: LAPAROSCOPIC CHOLECYSTECTOMY WITH INTRAOPERATIVE CHOLANGIOGRAM;  Surgeon: Manus Rudd, MD;  Location: [facility] OR;  Service: General;  Laterality: N/A;  . ERCP N/A 04/22/2018   Procedure: ENDOSCOPIC RETROGRADE CHOLANGIOPANCREATOGRAPHY (ERCP);  Surgeon: Willis Modena, MD;  Location: [facility] ENDOSCOPY;  Service: Endoscopy;  Laterality: N/A;  . ESOPHAGOGASTRODUODENOSCOPY (EGD) WITH PROPOFOL N/A 04/22/2018   Procedure: ESOPHAGOGASTRODUODENOSCOPY (EGD) WITH PROPOFOL;  Surgeon: Willis Modena, MD;  Location: [facility] ENDOSCOPY;  Service: Endoscopy;  Laterality: N/A;  . EUS N/A 04/22/2018   Procedure: UPPER ENDOSCOPIC ULTRASOUND (EUS) RADIAL;  Surgeon: Willis Modena, MD;  Location: [facility]  ENDOSCOPY;  Service: Endoscopy;  Laterality: N/A;  . NM MYOCAR PERF WALL MOTION  08/08/2011   lexiscan; normal pattern of perfusion in all regions; post-stress EF 69%; low risk scan   . REMOVAL OF STONES  04/22/2018   Procedure: REMOVAL OF STONES;  Surgeon: Willis Modena, MD;  Location: [facility] ENDOSCOPY;  Service: Endoscopy;;  . SPHINCTEROTOMY  04/22/2018   Procedure: Dennison Mascot;  Surgeon: Willis Modena, MD;  Location: [facility] ENDOSCOPY;  Service: Endoscopy;;  . TRANSTHORACIC ECHOCARDIOGRAM  2013   EF 52%, mild AV leaflet thickening; trace TR    Family History  Problem Relation Age of Onset  . Heart disease Neg Hx     Social History:  reports that he quit smoking about 61 years ago. His smoking use included cigarettes. He has a 22.00 pack-year smoking history. He has never used smokeless tobacco. He reports that he does not drink alcohol or use drugs.  Allergies:  Allergies  Allergen Reactions  . Quinine Nausea And Vomiting    Medications: I have reviewed the patient's current medications.  Results for orders placed or performed during the hospital encounter of 04/26/18 (from the past 48 hour(s))  Urinalysis, Routine w reflex microscopic     Status: Abnormal   Collection Time: 04/26/18  2:48 PM  Result Value Ref Range   Color, Urine YELLOW YELLOW   APPearance HAZY (A) CLEAR   Specific Gravity, Urine 1.015 1.005 - 1.030   pH 5.0 5.0 - 8.0   Glucose, UA NEGATIVE NEGATIVE mg/dL   Hgb urine dipstick NEGATIVE NEGATIVE   Bilirubin Urine NEGATIVE NEGATIVE   Ketones, ur 20 (A) NEGATIVE mg/dL   Protein, ur NEGATIVE NEGATIVE mg/dL   Nitrite  NEGATIVE NEGATIVE   Leukocytes,Ua NEGATIVE NEGATIVE    Comment: Performed at [facility], [address] [place], [place] [address]  Influenza panel by PCR (type A & B)     Status: None   Collection Time: 04/26/18  2:48 PM  Result Value Ref Range   Influenza A By PCR NEGATIVE NEGATIVE   Influenza B By PCR NEGATIVE NEGATIVE    Comment: (NOTE) The  Xpert Xpress Flu assay is intended as an aid in the diagnosis of  influenza and should not be used as a sole basis for treatment.  This  assay is FDA approved for nasopharyngeal swab specimens only. Nasal  washings and aspirates are unacceptable for Xpert Xpress Flu testing. Performed at [facility], [address], [place], [place] [address]   Comprehensive metabolic panel     Status: Abnormal   Collection Time: 04/26/18  3:08 PM  Result Value Ref Range   Sodium 135 135 - 145 mmol/L   Potassium 2.9 (L) 3.5 - 5.1 mmol/L   Chloride 99 98 - 111 mmol/L   CO2 24 22 - 32 mmol/L   Glucose, Bld 118 (H) 70 - 99 mg/dL   BUN 21 8 - 23 mg/dL   Creatinine, Ser 9.52 (H) 0.61 - 1.24 mg/dL   Calcium 8.2 (L) 8.9 - 10.3 mg/dL   Total Protein 6.8 6.5 - 8.1 g/dL   Albumin 2.8 (L) 3.5 - 5.0 g/dL   AST 53 (H) 15 - 41 U/L   ALT 58 (H) 0 - 44 U/L   Alkaline Phosphatase 159 (H) 38 - 126 U/L   Total Bilirubin 1.5 (H) 0.3 - 1.2 mg/dL   GFR calc non Af Amer 43 (L) >60 mL/min   GFR calc Af Amer 50 (L) >60 mL/min   Anion gap 12 5 - 15    Comment: Performed at [facility], [address], [place], [place] [address]  Lipase, blood     Status: None   Collection Time: 04/26/18  3:08 PM  Result Value Ref Range   Lipase 35 11 - 51 U/L    Comment: Performed at [facility], [address] [place], [place] [address]  Troponin I - Once     Status: Abnormal   Collection Time: 04/26/18  3:08 PM  Result Value Ref Range   Troponin I 0.06 (HH) <0.03 ng/mL    Comment: CRITICAL RESULT CALLED TO, READ BACK BY AND VERIFIED WITH: HOLCOMB,R ON 04/26/18 AT 1615 BY LOY,C Performed at [facility], [address], [place], [place] [address]   Lactic acid, plasma     Status: None   Collection Time: 04/26/18  3:08 PM  Result Value Ref Range   Lactic Acid, Venous 1.5 0.5 - 1.9 mmol/L    Comment: Performed at [facility], [address] [place], [place] [address]  CBC with Differential     Status: Abnormal   Collection Time:  04/26/18  3:08 PM  Result Value Ref Range   WBC 19.9 (H) 4.0 - 10.5 K/uL   RBC 3.55 (L) 4.22 - 5.81 MIL/uL   Hemoglobin 11.2 (L) 13.0 - 17.0 g/dL   HCT 34.7 (L) 42.5 - 95.6 %   MCV 93.8 80.0 - 100.0 fL   MCH 31.5 26.0 - 34.0 pg   MCHC 33.6 30.0 - 36.0 g/dL   RDW 38.7 56.4 - 33.2 %   Platelets 408 (H) 150 - 400 K/uL   nRBC 0.0 0.0 - 0.2 %   Neutrophils Relative %  88 %   Neutro Abs 17.5 (H) 1.7 - 7.7 K/uL   Lymphocytes Relative 3 %   Lymphs Abs 0.6 (L) 0.7 - 4.0 K/uL   Monocytes Relative 8 %   Monocytes Absolute 1.6 (H) 0.1 - 1.0 K/uL   Eosinophils Relative 0 %   Eosinophils Absolute 0.0 0.0 - 0.5 K/uL   Basophils Relative 0 %   Basophils Absolute 0.0 0.0 - 0.1 K/uL   Immature Granulocytes 1 %   Abs Immature Granulocytes 0.15 (H) 0.00 - 0.07 K/uL    Comment: Performed at [facility], [address], [place], [place] [address]  Protime-INR     Status: Abnormal   Collection Time: 04/26/18  3:08 PM  Result Value Ref Range   Prothrombin Time 23.7 (H) 11.4 - 15.2 seconds   INR 2.2 (H) 0.8 - 1.2    Comment: (NOTE) INR goal varies based on device and disease states. Performed at [facility], [address], [place], [place] [address]   Culture, blood (routine x 2)     Status: None (Preliminary result)   Collection Time: 04/26/18  3:08 PM  Result Value Ref Range   Specimen Description BLOOD RIGHT ARM    Special Requests      BOTTLES DRAWN AEROBIC AND ANAEROBIC Blood Culture adequate volume Performed at [facility], [address] [place], [place] [address]    Culture PENDING    Report Status PENDING   Magnesium     Status: None   Collection Time: 04/26/18  3:08 PM  Result Value Ref Range   Magnesium 2.0 1.7 - 2.4 mg/dL    Comment: Performed at [facility], [address] [place], [place] [address]  Culture, blood (routine x 2)     Status: None (Preliminary result)   Collection Time: 04/26/18  3:16 PM  Result Value Ref Range   Specimen Description BLOOD LEFT ARM DRAWN BY RN     Special Requests      BOTTLES DRAWN AEROBIC AND ANAEROBIC Blood Culture adequate volume Performed at [facility], [address], [place], [place] [address]    Culture PENDING    Report Status PENDING   Lactic acid, plasma     Status: None   Collection Time: 04/26/18  5:27 PM  Result Value Ref Range   Lactic Acid, Venous 1.0 0.5 - 1.9 mmol/L    Comment: Performed at [facility], [address] [place], [place] [address]  Troponin I - Now Then Q6H     Status: Abnormal   Collection Time: 04/26/18  9:03 PM  Result Value Ref Range   Troponin I 0.10 (HH) <0.03 ng/mL    Comment: CRITICAL VALUE NOTED.  VALUE IS CONSISTENT WITH PREVIOUSLY REPORTED AND CALLED VALUE. Performed at [facility], [address], [place], [place] [address]     Ct Abdomen Pelvis Wo Contrast  Result Date: 04/26/2018 CLINICAL DATA:  Gallbladder removed Friday. Nonproductive cough. Elevated temperature. Abdominal pain. EXAM: CT ABDOMEN AND PELVIS WITHOUT CONTRAST TECHNIQUE: Multidetector CT imaging of the abdomen and pelvis was performed following the standard protocol without IV contrast. COMPARISON:  CT abdomen 04/15/2018 FINDINGS: Lower chest: Bilateral small pleural effusions. Left lower lobe airspace disease likely reflecting atelectasis. Hepatobiliary: Normal liver. No intrahepatic biliary ductal dilatation. No hepatic mass. Interval cholecystectomy. 7.6 x 10 x 17 cm fluid collection emanating from the gallbladder fossa and tracking posteriorly with a few bubbles of air within the collection which may reflect a biloma or abscess. Mild inflammatory changes along  the inferior margin of the liver. Small amount of perihepatic fluid. Pancreas: Unremarkable. No pancreatic ductal dilatation or surrounding inflammatory changes. Spleen: Normal in size without focal abnormality. Adrenals/Urinary Tract: Adrenal glands are unremarkable. Kidneys are normal, without renal calculi, focal lesion, or hydronephrosis. Bladder is  unremarkable. Stomach/Bowel: Stomach is within normal limits. Appendix appears normal. No evidence of bowel wall thickening, distention, or inflammatory changes. Diverticulosis without evidence of diverticulitis. Vascular/Lymphatic: Normal caliber abdominal aorta with atherosclerosis. No lymphadenopathy. Reproductive: Prostate is unremarkable. Other: No fluid collection or hematoma. Small amount of pelvic free fluid. Musculoskeletal: No acute osseous abnormality. No aggressive osseous lesion. Chronic compression fracture of L1 vertebral body. Diffuse thoracolumbar spondylosis. IMPRESSION: 1. Interval cholecystectomy. 7.6 x 10 x 17 cm fluid collection emanating from the gallbladder fossa and tracking posteriorly with a few bubbles of air within the collection which may reflect a biloma versus abscess. Electronically Signed   By: Elige Ko   On: 04/26/2018 17:29   Dg Chest 2 View  Result Date: 04/26/2018 CLINICAL DATA:  85 year old male with cough.  Initial encounter. EXAM: CHEST - 2 VIEW COMPARISON:  12/30/2017 CT.  12/23/2017 chest x-ray. FINDINGS: Cardiomegaly.  Pulmonary vascular congestion. Left lower lobe consolidation with pleural effusion. Left lower lobe mass as noted on prior CT of indeterminate etiology. Right lower lobe nodule and scattered granulomas better delineated on prior CT. Calcified aorta. No obvious acute osseous abnormality although evaluation limited. Prior compression fracture L1. IMPRESSION: 1. Left lower lobe consolidation with pleural effusion. 2. Left lower lobe mass as noted on prior CT of indeterminate etiology. 3. Cardiomegaly.  Pulmonary vascular congestion. 4. Right lower lobe nodule and scattered granulomas better delineated on prior CT. 5.  Aortic Atherosclerosis (ICD10-I70.0). Electronically Signed   By: Lacy Duverney M.D.   On: 04/26/2018 18:11    Review of Systems  Constitutional: Positive for malaise/fatigue.  HENT: Negative.   Eyes: Negative.   Respiratory:  Positive for cough and sputum production.   Cardiovascular: Negative for chest pain.  Gastrointestinal: Positive for abdominal pain, nausea and vomiting.  Genitourinary: Negative.   Musculoskeletal: Negative.   Skin: Negative.   Neurological: Negative.   Endo/Heme/Allergies: Bruises/bleeds easily.  Psychiatric/Behavioral: Negative.    Blood pressure (!) 158/64, pulse (!) 109, temperature 100.1 F (37.8 C), temperature source Oral, resp. rate 18, SpO2 99 %. Physical Exam  Constitutional: He appears well-developed.  HENT:  Head: Normocephalic.  Mouth/Throat: Oropharynx is clear and moist.  Eyes: Pupils are equal, round, and reactive to light. No scleral icterus.  Neck: No tracheal deviation present. No thyromegaly present.  Cardiovascular:  Irregularly irregular  Respiratory:  Wheezing bilaterally with some rhonchi  GI: He exhibits distension. There is abdominal tenderness. There is no rebound and no guarding.  Mild distention, some right upper quadrant tenderness, no generalized peritonitis, incisions look okay and Steri-Strips are loosening  Neurological: He is alert. He exhibits normal muscle tone.  Skin: Skin is warm.    Assessment/Plan: Status post laparoscopic cholecystectomy 04/16/2018 by Dr. Corliss Skains Gallbladder fossa abscess - IV ABX, will ask interventional radiology to drain this area.  May need to correct INR for this to happen.  If the fluid is very bilious, may need HIDA scan to rule out bile leak.  We will follow with you.  I spoke with his daughter at length. PNA - per primary Liz Malady 04/26/2018, 11:52 PM

## 2018-04-26 NOTE — ED Provider Notes (Signed)
[facility] EMERGENCY DEPARTMENT Provider Note   CSN: [id number] Arrival date & time: 04/26/18  1400    History   Chief Complaint Chief Complaint  Patient presents with   Fever    HPI Vincent Barrett is a 85 y.o. male.     HPI  Pt was seen at 1440.  Per pt and his wife, c/o gradual onset and persistence of constant generalized abd "pain" since admission/discharge from the hospital for this complaint on 04/23/18. Has been associated with nausea, generalized weakness and "congested cough." Pt's wife states pt "had all this when he was in the hospital." Describes the abd pain as located in his RUQ and "like when they told me it was my gallbladder." Denies having BM since hospital discharge but "has been passing gas." Pt's Home Health RN "told him he had a fever of 101 today." Pt has only took tramadol for pain today, no meds for fever. Denies vomiting/diarrhea, no fevers, no back pain, no rash, no CP/SOB.      Past Medical History:  Diagnosis Date   BPH (benign prostatic hyperplasia)    COPD (chronic obstructive pulmonary disease) (HCC)    Hypertension    OA (osteoarthritis)    PAF (paroxysmal atrial fibrillation) (HCC)     Patient Active Problem List   Diagnosis Date Noted   Abdominal pain 04/15/2018   Hypokalemia 04/15/2018   CKD (chronic kidney disease) stage 3, GFR 30-59 ml/min (HCC) 04/15/2018   Cholelithiasis with choledocholithiasis 04/15/2018   Solitary pulmonary nodule 09/04/2017   Upper airway cough syndrome 09/03/2017   On anticoagulant therapy 03/25/2017   Atrial flutter (HCC) 02/20/2017   Leg edema, right 02/21/2016   Constipation 02/19/2015   Atrial fibrillation (HCC) 02/17/2013   Essential hypertension 02/17/2013   COPD (chronic obstructive pulmonary disease) (HCC) 02/17/2013    Past Surgical History:  Procedure Laterality Date   CARDIOVERSION N/A 03/31/2017   Procedure: CARDIOVERSION;  Surgeon: Lars Masson, MD;  Location: [facility]  ENDOSCOPY;  Service: Cardiovascular;  Laterality: N/A;   CARPAL TUNNEL RELEASE  02/2010   Dr. Franky Macho   CHOLECYSTECTOMY N/A 04/16/2018   Procedure: LAPAROSCOPIC CHOLECYSTECTOMY WITH INTRAOPERATIVE CHOLANGIOGRAM;  Surgeon: Manus Rudd, MD;  Location: [facility] OR;  Service: General;  Laterality: N/A;   ERCP N/A 04/22/2018   Procedure: ENDOSCOPIC RETROGRADE CHOLANGIOPANCREATOGRAPHY (ERCP);  Surgeon: Willis Modena, MD;  Location: [facility] ENDOSCOPY;  Service: Endoscopy;  Laterality: N/A;   ESOPHAGOGASTRODUODENOSCOPY (EGD) WITH PROPOFOL N/A 04/22/2018   Procedure: ESOPHAGOGASTRODUODENOSCOPY (EGD) WITH PROPOFOL;  Surgeon: Willis Modena, MD;  Location: [facility] ENDOSCOPY;  Service: Endoscopy;  Laterality: N/A;   EUS N/A 04/22/2018   Procedure: UPPER ENDOSCOPIC ULTRASOUND (EUS) RADIAL;  Surgeon: Willis Modena, MD;  Location: [facility] ENDOSCOPY;  Service: Endoscopy;  Laterality: N/A;   NM MYOCAR PERF WALL MOTION  08/08/2011   lexiscan; normal pattern of perfusion in all regions; post-stress EF 69%; low risk scan    REMOVAL OF STONES  04/22/2018   Procedure: REMOVAL OF STONES;  Surgeon: Willis Modena, MD;  Location: [facility] ENDOSCOPY;  Service: Endoscopy;;   SPHINCTEROTOMY  04/22/2018   Procedure: Dennison Mascot;  Surgeon: Willis Modena, MD;  Location: [facility] ENDOSCOPY;  Service: Endoscopy;;   TRANSTHORACIC ECHOCARDIOGRAM  2013   EF 52%, mild AV leaflet thickening; trace TR        Home Medications    Prior to Admission medications   Medication Sig Start Date End Date Taking? Authorizing Provider  amLODipine (NORVASC) 2.5 MG tablet Take 1 tablet (2.5 mg total) by mouth daily.  02/21/16  Yes Hilty, Lisette Abu, MD  Ascorbic Acid (VITAMIN C) 1000 MG tablet Take 1,000 mg by mouth daily.   Yes [provider]  doxazosin (CARDURA) 4 MG tablet Take 1 tablet (4 mg total) by mouth daily. KEEP OV. 01/16/17  Yes Hilty, Lisette Abu, MD  ELIQUIS 5 MG TABS tablet TAKE 1 TABLET BY MOUTH TWICE A DAY 04/26/18  Yes Hilty, Lisette Abu, MD  losartan-hydrochlorothiazide (HYZAAR) 100-25 MG tablet Take 1 tablet by mouth daily. 01/18/18  Yes Hilty, Lisette Abu, MD  metoprolol succinate (TOPROL-XL) 25 MG 24 hr tablet TAKE 1 TABLET (25 MG TOTAL) BY MOUTH DAILY. 04/13/17  Yes Hilty, Lisette Abu, MD  Multiple Vitamins-Minerals (PRESERVISION AREDS 2) CAPS Take 1 capsule by mouth daily.   Yes [provider]  ondansetron (ZOFRAN) 4 MG tablet Take 1 tablet (4 mg total) by mouth every 6 (six) hours as needed for nausea. 04/23/18  Yes Glade Lloyd, MD  traMADol (ULTRAM) 50 MG tablet Take 1 tablet (50 mg total) by mouth every 6 (six) hours as needed for severe pain. 04/23/18 04/23/19 Yes Glade Lloyd, MD    Family History Family History  Problem Relation Age of Onset   Heart disease Neg Hx     Social History Social History   Tobacco Use   Smoking status: Former Smoker    Packs/day: 2.00    Years: 11.00    Pack years: 22.00    Types: Cigarettes    Last attempt to quit: 02/10/1957    Years since quitting: 61.2   Smokeless tobacco: Never Used  Substance Use Topics   Alcohol use: No   Drug use: No     Allergies   Quinine   Review of Systems Review of Systems ROS: Statement: All systems negative except as marked or noted in the HPI; Constitutional: +generalized weakness, fever and chills. ; ; Eyes: Negative for eye pain, redness and discharge. ; ; ENMT: Negative for ear pain, hoarseness, nasal congestion, sinus pressure and sore throat. ; ; Cardiovascular: Negative for chest pain, palpitations, diaphoresis, dyspnea and peripheral edema. ; ; Respiratory: +cough. Negative for wheezing and stridor. ; ; Gastrointestinal: +nausea, abd pain. Negative for vomiting, diarrhea, blood in stool, hematemesis, jaundice and rectal bleeding. . ; ; Genitourinary: Negative for dysuria, flank pain and hematuria. ; ; Musculoskeletal: Negative for back pain and neck pain. Negative for swelling and trauma.; ; Skin: Negative for pruritus,  rash, abrasions, blisters, bruising and skin lesion.; ; Neuro: Negative for headache, lightheadedness and neck stiffness. Negative for altered level of consciousness, altered mental status, extremity weakness, paresthesias, involuntary movement, seizure and syncope.       Physical Exam Updated Vital Signs BP (!) 160/68    Pulse (!) 104    Temp (!) 103.5 F (39.7 C) (Rectal)    Resp (!) 40    SpO2 93%    Patient Vitals for the past 24 hrs:  BP Temp Temp src Pulse Resp SpO2  04/26/18 1615 -- -- -- (!) 104 (!) 40 93 %  04/26/18 1600 (!) 160/68 -- -- -- -- --  04/26/18 1545 -- -- -- (!) 116 (!) 28 94 %  04/26/18 1534 -- (!) 103.5 F (39.7 C) Rectal -- -- --  04/26/18 1530 136/61 -- -- (!) 110 (!) 34 97 %  04/26/18 1515 -- -- -- (!) 106 (!) 36 95 %  04/26/18 1500 (!) 164/67 -- -- (!) 108 (!) 39 98 %  04/26/18 1445 -- -- -- Marland Kitchen  114 19 94 %  04/26/18 1430 (!) 143/65 -- -- (!) 109 (!) 32 94 %  04/26/18 1405 (!) 166/76 -- -- -- -- --  04/26/18 1404 -- -- -- -- -- 94 %  04/26/18 1403 (!) 151/118 99.1 F (37.3 C) Oral (!) 105 (!) 25 94 %   15:44:02 Orthostatic Vital Signs RH  Orthostatic Lying   BP- Lying: 171/52  Pulse- Lying: 105      Orthostatic Sitting  BP- Sitting: 162/77  Pulse- Sitting: 116      Orthostatic Standing at 0 minutes  BP- Standing at 0 minutes: 158/77  Pulse- Standing at 0 minutes: 126     Physical Exam 1445: Physical examination:  Nursing notes reviewed; Vital signs and O2 SAT reviewed;  Constitutional: Well developed, Well nourished, In no acute distress; Head:  Normocephalic, atraumatic; Eyes: EOMI, PERRL, No scleral icterus; ENMT: Mouth and pharynx normal, Mucous membranes dry; Neck: Supple, Full range of motion, No lymphadenopathy; Cardiovascular: Tachycardic irregular rate and rhythm, No gallop; Respiratory: Breath sounds coarse & equal bilaterally, No wheezes.  +moist cough during exam. Speaking sentences, Normal respiratory effort/excursion; Chest:  Nontender, Movement normal; Abdomen: Soft, +generalized tenderness to palp. Nondistended, Normal bowel sounds; Genitourinary: No CVA tenderness; Extremities: Peripheral pulses normal, No tenderness, No edema, No calf edema or asymmetry.; Neuro: AA&Ox3, No facial droop.Marland Kitchen  Speech clear. Moves all extremities on stretcher spontaneously and to command without apparent gross focal motor deficits.; Skin: Color normal, Warm, Dry.   ED Treatments / Results  Labs (all labs ordered are listed, but only abnormal results are displayed)   EKG EKG Interpretation  Date/Time:  Monday April 26 2018 14:04:48 EDT Ventricular Rate:  106 PR Interval:    QRS Duration: 94 QT Interval:  284 QTC Calculation: 377 R Axis:   -54 Text Interpretation:  Atrial flutter Left anterior fascicular block RSR' in V1 or V2, probably normal variant Consider anterior infarct Borderline repolarization abnormality When compared with ECG of 04/14/2018 Rate faster Confirmed by Samuel Jester [phone]) on 04/26/2018 3:55:48 PM   Radiology   Procedures Procedures (including critical care time)  Medications Ordered in ED Medications  0.9 %  sodium chloride infusion ( Intravenous New Bag/Given 04/26/18 1520)  metroNIDAZOLE (FLAGYL) IVPB 500 mg (500 mg Intravenous New Bag/Given 04/26/18 1729)  vancomycin (VANCOCIN) 1,500 mg in sodium chloride 0.9 % 500 mL IVPB (has no administration in time range)  ceFEPIme (MAXIPIME) 2 g in sodium chloride 0.9 % 100 mL IVPB (has no administration in time range)  vancomycin (VANCOCIN) IVPB 750 mg/150 ml premix (has no administration in time range)  potassium chloride 10 mEq in 100 mL IVPB (has no administration in time range)  acetaminophen (TYLENOL) suppository 650 mg (650 mg Rectal Given 04/26/18 1555)  ceFEPIme (MAXIPIME) 2 g in sodium chloride 0.9 % 100 mL IVPB (0 g Intravenous Stopped 04/26/18 1650)     Initial Impression / Assessment and Plan / ED Course  I have reviewed the triage vital  signs and the nursing notes.  Pertinent labs & imaging results that were available during my care of the patient were reviewed by me and considered in my medical decision making (see chart for details).     MDM Reviewed: previous chart, nursing note and vitals Reviewed previous: labs and ECG Interpretation: labs, ECG, x-ray and CT scan Total time providing critical care: 30-74 minutes. This excludes time spent performing separately reportable procedures and services. Consults: general surgery and admitting MD    CRITICAL CARE Performed  by: Samuel Jester Total critical care time: 45 minutes Critical care time was exclusive of separately billable procedures and treating other patients. Critical care was necessary to treat or prevent imminent or life-threatening deterioration. Critical care was time spent personally by me on the following activities: development of treatment plan with patient and/or surrogate as well as nursing, discussions with consultants, evaluation of patient's response to treatment, examination of patient, obtaining history from patient or surrogate, ordering and performing treatments and interventions, ordering and review of laboratory studies, ordering and review of radiographic studies, pulse oximetry and re-evaluation of patient's condition.   Results for orders placed or performed during the hospital encounter of 04/26/18  Culture, blood (routine x 2)  Result Value Ref Range   Specimen Description BLOOD RIGHT ARM    Special Requests      BOTTLES DRAWN AEROBIC AND ANAEROBIC Blood Culture adequate volume Performed at [facility], [address], [place], [place] [address]    Culture PENDING    Report Status PENDING   Culture, blood (routine x 2)  Result Value Ref Range   Specimen Description BLOOD LEFT ARM DRAWN BY RN    Special Requests      BOTTLES DRAWN AEROBIC AND ANAEROBIC Blood Culture adequate volume Performed at [facility], [address]  [place], [place] [address]    Culture PENDING    Report Status PENDING   Comprehensive metabolic panel  Result Value Ref Range   Sodium 135 135 - 145 mmol/L   Potassium 2.9 (L) 3.5 - 5.1 mmol/L   Chloride 99 98 - 111 mmol/L   CO2 24 22 - 32 mmol/L   Glucose, Bld 118 (H) 70 - 99 mg/dL   BUN 21 8 - 23 mg/dL   Creatinine, Ser 1.69 (H) 0.61 - 1.24 mg/dL   Calcium 8.2 (L) 8.9 - 10.3 mg/dL   Total Protein 6.8 6.5 - 8.1 g/dL   Albumin 2.8 (L) 3.5 - 5.0 g/dL   AST 53 (H) 15 - 41 U/L   ALT 58 (H) 0 - 44 U/L   Alkaline Phosphatase 159 (H) 38 - 126 U/L   Total Bilirubin 1.5 (H) 0.3 - 1.2 mg/dL   GFR calc non Af Amer 43 (L) >60 mL/min   GFR calc Af Amer 50 (L) >60 mL/min   Anion gap 12 5 - 15  Lipase, blood  Result Value Ref Range   Lipase 35 11 - 51 U/L  Troponin I - Once  Result Value Ref Range   Troponin I 0.06 (HH) <0.03 ng/mL  Lactic acid, plasma  Result Value Ref Range   Lactic Acid, Venous 1.5 0.5 - 1.9 mmol/L  Lactic acid, plasma  Result Value Ref Range   Lactic Acid, Venous 1.0 0.5 - 1.9 mmol/L  CBC with Differential  Result Value Ref Range   WBC 19.9 (H) 4.0 - 10.5 K/uL   RBC 3.55 (L) 4.22 - 5.81 MIL/uL   Hemoglobin 11.2 (L) 13.0 - 17.0 g/dL   HCT 67.8 (L) 93.8 - 10.1 %   MCV 93.8 80.0 - 100.0 fL   MCH 31.5 26.0 - 34.0 pg   MCHC 33.6 30.0 - 36.0 g/dL   RDW 75.1 02.5 - 85.2 %   Platelets 408 (H) 150 - 400 K/uL   nRBC 0.0 0.0 - 0.2 %   Neutrophils Relative % 88 %   Neutro Abs 17.5 (H) 1.7 - 7.7 K/uL   Lymphocytes Relative 3 %   Lymphs Abs 0.6 (L) 0.7 -  4.0 K/uL   Monocytes Relative 8 %   Monocytes Absolute 1.6 (H) 0.1 - 1.0 K/uL   Eosinophils Relative 0 %   Eosinophils Absolute 0.0 0.0 - 0.5 K/uL   Basophils Relative 0 %   Basophils Absolute 0.0 0.0 - 0.1 K/uL   Immature Granulocytes 1 %   Abs Immature Granulocytes 0.15 (H) 0.00 - 0.07 K/uL  Protime-INR  Result Value Ref Range   Prothrombin Time 23.7 (H) 11.4 - 15.2 seconds   INR 2.2 (H) 0.8 - 1.2  Urinalysis,  Routine w reflex microscopic  Result Value Ref Range   Color, Urine YELLOW YELLOW   APPearance HAZY (A) CLEAR   Specific Gravity, Urine 1.015 1.005 - 1.030   pH 5.0 5.0 - 8.0   Glucose, UA NEGATIVE NEGATIVE mg/dL   Hgb urine dipstick NEGATIVE NEGATIVE   Bilirubin Urine NEGATIVE NEGATIVE   Ketones, ur 20 (A) NEGATIVE mg/dL   Protein, ur NEGATIVE NEGATIVE mg/dL   Nitrite NEGATIVE NEGATIVE   Leukocytes,Ua NEGATIVE NEGATIVE  Influenza panel by PCR (type A & B)  Result Value Ref Range   Influenza A By PCR NEGATIVE NEGATIVE   Influenza B By PCR NEGATIVE NEGATIVE   Ct Abdomen Pelvis Wo Contrast Result Date: 04/26/2018 CLINICAL DATA:  Gallbladder removed Friday. Nonproductive cough. Elevated temperature. Abdominal pain. EXAM: CT ABDOMEN AND PELVIS WITHOUT CONTRAST TECHNIQUE: Multidetector CT imaging of the abdomen and pelvis was performed following the standard protocol without IV contrast. COMPARISON:  CT abdomen 04/15/2018 FINDINGS: Lower chest: Bilateral small pleural effusions. Left lower lobe airspace disease likely reflecting atelectasis. Hepatobiliary: Normal liver. No intrahepatic biliary ductal dilatation. No hepatic mass. Interval cholecystectomy. 7.6 x 10 x 17 cm fluid collection emanating from the gallbladder fossa and tracking posteriorly with a few bubbles of air within the collection which may reflect a biloma or abscess. Mild inflammatory changes along the inferior margin of the liver. Small amount of perihepatic fluid. Pancreas: Unremarkable. No pancreatic ductal dilatation or surrounding inflammatory changes. Spleen: Normal in size without focal abnormality. Adrenals/Urinary Tract: Adrenal glands are unremarkable. Kidneys are normal, without renal calculi, focal lesion, or hydronephrosis. Bladder is unremarkable. Stomach/Bowel: Stomach is within normal limits. Appendix appears normal. No evidence of bowel wall thickening, distention, or inflammatory changes. Diverticulosis without  evidence of diverticulitis. Vascular/Lymphatic: Normal caliber abdominal aorta with atherosclerosis. No lymphadenopathy. Reproductive: Prostate is unremarkable. Other: No fluid collection or hematoma. Small amount of pelvic free fluid. Musculoskeletal: No acute osseous abnormality. No aggressive osseous lesion. Chronic compression fracture of L1 vertebral body. Diffuse thoracolumbar spondylosis. IMPRESSION: 1. Interval cholecystectomy. 7.6 x 10 x 17 cm fluid collection emanating from the gallbladder fossa and tracking posteriorly with a few bubbles of air within the collection which may reflect a biloma versus abscess. Electronically Signed   By: Elige Ko   On: 04/26/2018 17:29     Results for HILDA, WEXLER (MRN [id number]) as of 04/26/2018 18:10  Ref. Range 04/20/2018 03:36 04/22/2018 03:36 04/23/2018 04:20 04/26/2018 15:08  AST Latest Ref Range: 15 - 41 U/L 42 (H) 31 30 53 (H)  ALT Latest Ref Range: 0 - 44 U/L 81 (H) 60 (H) 54 (H) 58 (H)  Total Bilirubin Latest Ref Range: 0.3 - 1.2 mg/dL 1.6 (H) 1.3 (H) 1.4 (H) 1.5 (H)     1830:  Pt with elevated rectal temp; APAP given for fever. Code Sepsis called. IV abx started. Potassium repleted IV; magnesium level pending. Pt's troponin elevated, no old to compare and EKG without acute STTW  changes. LFT's were trending downward during last admission; mildly elevated today.  [facility] Dr. Janee Mornhompson, case discussed, including:  HPI, pertinent PM/SHx, VS/PE, dx testing, ED course and treatment:  Agreeable to consult, requests to transfer[facility]ad[facility] to [facility].   1850:  T/C returned from [facility] Dr. Mariea ClontsEmokpae, case discussed, including:  HPI, pertinent PM/SHx, VS/PE, dx testing, ED course and treatment, as well as d/w General Surgery:  Agreeable to [facility].      Final Clinical Impressions(s) / ED Diagnoses   Final diagnoses:  None    ED Discharge Orders    None       Samuel JesterMcManus, Delphina Schum,  DO 04/30/18 0827

## 2018-04-26 NOTE — ED Notes (Signed)
This NT escorted by Kathyrn Drown, NT applied condom cath to pt.

## 2018-04-26 NOTE — ED Triage Notes (Signed)
Gallbladder removed on Friday at [facility].  Cough in hospital, still coughing (non-productive).  Temp at home 99.3.  C/o abdominal pain on Friday at surgical site.   168/70, Pulse 108.  Sats 98 % on RA.

## 2018-04-26 NOTE — ED Notes (Signed)
Date and time results received: 04/26/18 1615 (use smartphrase ".now" to insert current time)  Test: troponin Critical Value: 0.06  Name of Provider Notified: Dr. Clarene Duke   Orders Received? Or Actions Taken?: n/a

## 2018-04-26 NOTE — H&P (Addendum)
History and Physical    Vincent Barrett WGN:[id number] DOB: 16-Jul-1925 DOA: 04/26/2018  PCP: [name], MD   Patient coming from: Home  I have personally briefly reviewed patient's old medical records in [facility]  Chief Complaint: Abdominal pain, SOB  HPI: Vincent Barrett is a 85 y.o. male with medical history significant for atrial fibrillation/flutter, hypertension, COPD, CKD who presented to the ED with complaints of increasing abdominal pain with bloating, difficulty breathing, productive cough, generalized weakness and nausea since discharge from hospital 3 days ago  No loose stools or vomiting. But with fevers and chills at home-temp of 101 recorded by home health nurse today.  Patient has been lying in bed for most of the day, sick contacts, no travels. No sorethroat, nasal congestion or body aches apart from abdominal pain. Last dose of Eliquis was this morning.  Recent hospitalization 3/4 -  04/23/2018 for acute calculus cholecystitis, with choledocholithiasis.  Underwent laparoscopic cholecystectomy 04/16/2018, and subsequent ERCP and EUS 03/24/2018, with biliary sphincterotomy and balloon extraction of common bile duct stone.  ED Course: Febrile to 103.5.  Tachycardic to 116, tachypneic to 40.  Significant leukocytosis 19.9.  With normal lactic acid 1.5.  Mild elevated troponin 0 0.06.  Normal lipase 33.  Hypokalemia 2.9, creatinine at baseline 1.4.  Two-view chest x-ray showed left lower lobe consolidation with pleural effusion, left lower lobe mass as seen on prior CT of indeterminate etiology.  CT abdomen and pelvis without contrast-interval cholecystectomy.  7.6 x 10 x 7 cm fluid collection emanating from the gallbladder fossa and tracking posteriorly with a few bubbles of air within the collection which may reflect a biloma versus abscess.  Patient given 500 mill bolus, started on broad-spectrum antibiotics cefepime, Vanco and metronidazole.  KCl 102 given. EDP talked to general  surgeon on-call Dr. Janee Morn who recommended admission to hospitalist service and transfer to [facility].   Review of Systems: As per HPI all other systems reviewed and negative.  Past Medical History:  Diagnosis Date  . BPH (benign prostatic hyperplasia)   . COPD (chronic obstructive pulmonary disease) (HCC)   . Hypertension   . OA (osteoarthritis)   . PAF (paroxysmal atrial fibrillation) (HCC)     Past Surgical History:  Procedure Laterality Date  . CARDIOVERSION N/A 03/31/2017   Procedure: CARDIOVERSION;  Surgeon: Lars Masson, MD;  Location: [facility] ENDOSCOPY;  Service: Cardiovascular;  Laterality: N/A;  . CARPAL TUNNEL RELEASE  02/2010   Dr. Franky Macho  . CHOLECYSTECTOMY N/A 04/16/2018   Procedure: LAPAROSCOPIC CHOLECYSTECTOMY WITH INTRAOPERATIVE CHOLANGIOGRAM;  Surgeon: Manus Rudd, MD;  Location: [facility] OR;  Service: General;  Laterality: N/A;  . ERCP N/A 04/22/2018   Procedure: ENDOSCOPIC RETROGRADE CHOLANGIOPANCREATOGRAPHY (ERCP);  Surgeon: Willis Modena, MD;  Location: [facility] ENDOSCOPY;  Service: Endoscopy;  Laterality: N/A;  . ESOPHAGOGASTRODUODENOSCOPY (EGD) WITH PROPOFOL N/A 04/22/2018   Procedure: ESOPHAGOGASTRODUODENOSCOPY (EGD) WITH PROPOFOL;  Surgeon: Willis Modena, MD;  Location: [facility] ENDOSCOPY;  Service: Endoscopy;  Laterality: N/A;  . EUS N/A 04/22/2018   Procedure: UPPER ENDOSCOPIC ULTRASOUND (EUS) RADIAL;  Surgeon: Willis Modena, MD;  Location: [facility] ENDOSCOPY;  Service: Endoscopy;  Laterality: N/A;  . NM MYOCAR PERF WALL MOTION  08/08/2011   lexiscan; normal pattern of perfusion in all regions; post-stress EF 69%; low risk scan   . REMOVAL OF STONES  04/22/2018   Procedure: REMOVAL OF STONES;  Surgeon: Willis Modena, MD;  Location: [facility] ENDOSCOPY;  Service: Endoscopy;;  . SPHINCTEROTOMY  04/22/2018   Procedure: SPHINCTEROTOMY;  Surgeon: Willis Modena, MD;  Location: [facility] ENDOSCOPY;  Service: Endoscopy;;  . TRANSTHORACIC ECHOCARDIOGRAM  2013   EF 52%, mild AV leaflet  thickening; trace TR     reports that he quit smoking about 61 years ago. His smoking use included cigarettes. He has a 22.00 pack-year smoking history. He has never used smokeless tobacco. He reports that he does not drink alcohol or use drugs.  Allergies  Allergen Reactions  . Quinine Nausea And Vomiting    Family History  Problem Relation Age of Onset  . Heart disease Neg Hx     Prior to Admission medications   Medication Sig Start Date End Date Taking? Authorizing Provider  amLODipine (NORVASC) 2.5 MG tablet Take 1 tablet (2.5 mg total) by mouth daily. 02/21/16  Yes Hilty, Lisette Abu, MD  Ascorbic Acid (VITAMIN C) 1000 MG tablet Take 1,000 mg by mouth daily.   Yes [provider]  doxazosin (CARDURA) 4 MG tablet Take 1 tablet (4 mg total) by mouth daily. KEEP OV. 01/16/17  Yes Hilty, Lisette Abu, MD  ELIQUIS 5 MG TABS tablet TAKE 1 TABLET BY MOUTH TWICE A DAY 04/26/18  Yes Hilty, Lisette Abu, MD  losartan-hydrochlorothiazide (HYZAAR) 100-25 MG tablet Take 1 tablet by mouth daily. 01/18/18  Yes Hilty, Lisette Abu, MD  metoprolol succinate (TOPROL-XL) 25 MG 24 hr tablet TAKE 1 TABLET (25 MG TOTAL) BY MOUTH DAILY. 04/13/17  Yes Hilty, Lisette Abu, MD  Multiple Vitamins-Minerals (PRESERVISION AREDS 2) CAPS Take 1 capsule by mouth daily.   Yes [provider]  ondansetron (ZOFRAN) 4 MG tablet Take 1 tablet (4 mg total) by mouth every 6 (six) hours as needed for nausea. 04/23/18  Yes Glade Lloyd, MD  traMADol (ULTRAM) 50 MG tablet Take 1 tablet (50 mg total) by mouth every 6 (six) hours as needed for severe pain. 04/23/18 04/23/19 Yes Glade Lloyd, MD    Physical Exam: Vitals:   04/26/18 1745 04/26/18 1800 04/26/18 1815 04/26/18 1830  BP:  (!) 143/66  138/60  Pulse: (!) 106 (!) 107 (!) 105 (!) 106  Resp:      Temp:      TempSrc:      SpO2: 94% 95% 95% 94%    Constitutional: Appears weak, calm, comfortable Vitals:   04/26/18 1745 04/26/18 1800 04/26/18 1815 04/26/18 1830   BP:  (!) 143/66  138/60  Pulse: (!) 106 (!) 107 (!) 105 (!) 106  Resp:      Temp:      TempSrc:      SpO2: 94% 95% 95% 94%   Eyes: PERRL, lids and conjunctivae normal ENMT: Mucous membranes are dry. Posterior pharynx clear of any exudate or lesions.Normal dentition.  Neck: normal, supple, no masses, no thyromegaly Respiratory: clear to auscultation bilaterally, no wheezing, no crackles. Normal respiratory effort. No accessory muscle use.  Cardiovascular: Mild Tachycardia, regular rate and rhythm, no murmurs / rubs / gallops. Trace extremity edema L > R, . 2+ pedal pulses.   Abdomen: Moderate diffuse tenderness, worse right upper quadrant, abdomen soft, no masses palpated. No hepatosplenomegaly. Bowel sounds positive.  Musculoskeletal: no clubbing / cyanosis. No joint deformity upper and lower extremities. Good ROM, no contractures. Normal muscle tone.  Skin: no rashes, lesions, ulcers. No induration Neurologic: CN 2-12 grossly intact.  Strength 5/5 in all 4.  Psychiatric: Normal judgment and insight. Alert and oriented x 3. Normal mood.   Labs on Admission: I have personally reviewed following labs and imaging studies  CBC:  Recent Labs  Lab 04/20/18 0336 04/22/18 0336 04/23/18 0420 04/26/18 1508  WBC 10.0 11.1* 12.2* 19.9*  NEUTROABS  --  8.1* 9.0* 17.5*  HGB 10.1* 10.8* 11.4* 11.2*  HCT 30.6* 31.7* 33.1* 33.3*  MCV 93.9 91.9 91.7 93.8  PLT 209 254 304 408*   Basic Metabolic Panel: Recent Labs  Lab 04/20/18 0336 04/21/18 0329 04/22/18 0336 04/23/18 0420 04/26/18 1508  NA 138  --  138 137 135  K 4.2  --  3.9 3.8 2.9*  CL 111  --  107 106 99  CO2 20*  --  23 22 24   GLUCOSE 126*  --  101* 112* 118*  BUN 14  --  13 17 21   CREATININE 1.43*  --  1.41* 1.43* 1.42*  CALCIUM 8.1*  --  8.2* 8.4* 8.2*  MG 1.7 1.9 1.7 1.7  --    Liver Function Tests: Recent Labs  Lab 04/20/18 0336 04/22/18 0336 04/23/18 0420 04/26/18 1508  AST 42* 31 30 53*  ALT 81* 60* 54* 58*   ALKPHOS 179* 152* 147* 159*  BILITOT 1.6* 1.3* 1.4* 1.5*  PROT 5.6* 5.8* 5.7* 6.8  ALBUMIN 2.2* 2.3* 2.3* 2.8*   Recent Labs  Lab 04/26/18 1508  LIPASE 35   Coagulation Profile: Recent Labs  Lab 04/26/18 1508  INR 2.2*   Cardiac Enzymes: Recent Labs  Lab 04/26/18 1508  TROPONINI 0.06*   Urine analysis:    Component Value Date/Time   COLORURINE YELLOW 04/26/2018 1448   APPEARANCEUR HAZY (A) 04/26/2018 1448   LABSPEC 1.015 04/26/2018 1448   PHURINE 5.0 04/26/2018 1448   GLUCOSEU NEGATIVE 04/26/2018 1448   HGBUR NEGATIVE 04/26/2018 1448   BILIRUBINUR NEGATIVE 04/26/2018 1448   KETONESUR 20 (A) 04/26/2018 1448   PROTEINUR NEGATIVE 04/26/2018 1448   NITRITE NEGATIVE 04/26/2018 1448   LEUKOCYTESUR NEGATIVE 04/26/2018 1448    Radiological Exams on Admission: Ct Abdomen Pelvis Wo Contrast  Result Date: 04/26/2018 CLINICAL DATA:  Gallbladder removed Friday. Nonproductive cough. Elevated temperature. Abdominal pain. EXAM: CT ABDOMEN AND PELVIS WITHOUT CONTRAST TECHNIQUE: Multidetector CT imaging of the abdomen and pelvis was performed following the standard protocol without IV contrast. COMPARISON:  CT abdomen 04/15/2018 FINDINGS: Lower chest: Bilateral small pleural effusions. Left lower lobe airspace disease likely reflecting atelectasis. Hepatobiliary: Normal liver. No intrahepatic biliary ductal dilatation. No hepatic mass. Interval cholecystectomy. 7.6 x 10 x 17 cm fluid collection emanating from the gallbladder fossa and tracking posteriorly with a few bubbles of air within the collection which may reflect a biloma or abscess. Mild inflammatory changes along the inferior margin of the liver. Small amount of perihepatic fluid. Pancreas: Unremarkable. No pancreatic ductal dilatation or surrounding inflammatory changes. Spleen: Normal in size without focal abnormality. Adrenals/Urinary Tract: Adrenal glands are unremarkable. Kidneys are normal, without renal calculi, focal lesion,  or hydronephrosis. Bladder is unremarkable. Stomach/Bowel: Stomach is within normal limits. Appendix appears normal. No evidence of bowel wall thickening, distention, or inflammatory changes. Diverticulosis without evidence of diverticulitis. Vascular/Lymphatic: Normal caliber abdominal aorta with atherosclerosis. No lymphadenopathy. Reproductive: Prostate is unremarkable. Other: No fluid collection or hematoma. Small amount of pelvic free fluid. Musculoskeletal: No acute osseous abnormality. No aggressive osseous lesion. Chronic compression fracture of L1 vertebral body. Diffuse thoracolumbar spondylosis. IMPRESSION: 1. Interval cholecystectomy. 7.6 x 10 x 17 cm fluid collection emanating from the gallbladder fossa and tracking posteriorly with a few bubbles of air within the collection which may reflect a biloma versus abscess. Electronically Signed   By: Alan Ripper  Patel   On: 04/26/2018 17:29   Dg Chest 2 View  Result Date: 04/26/2018 CLINICAL DATA:  85 year old male with cough.  Initial encounter. EXAM: CHEST - 2 VIEW COMPARISON:  12/30/2017 CT.  12/23/2017 chest x-ray. FINDINGS: Cardiomegaly.  Pulmonary vascular congestion. Left lower lobe consolidation with pleural effusion. Left lower lobe mass as noted on prior CT of indeterminate etiology. Right lower lobe nodule and scattered granulomas better delineated on prior CT. Calcified aorta. No obvious acute osseous abnormality although evaluation limited. Prior compression fracture L1. IMPRESSION: 1. Left lower lobe consolidation with pleural effusion. 2. Left lower lobe mass as noted on prior CT of indeterminate etiology. 3. Cardiomegaly.  Pulmonary vascular congestion. 4. Right lower lobe nodule and scattered granulomas better delineated on prior CT. 5.  Aortic Atherosclerosis (ICD10-I70.0). Electronically Signed   By: Lacy DuverneySteven  Olson M.D.   On: 04/26/2018 18:11    EKG: Independently reviewed.  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  Rate 106.  QTc 377.  EKG unchanged  from prior.  Assessment/Plan Active Problems:   Sepsis (HCC)    Abdominal abscess/biloma-status post cholecystectomy and ERCP with EUS on recent hospitalization.  Meeting sepsis criteria, WBC 19.9 without lactic acidosis.  Abd/pelvic CT without contrast shows fluid collection from gallbladder fossa abscess/biloma.  General surgeon consulted, [facility] -Continue broad-spectrum IV antibiotics Vanco, cefepime and metronidazole started in ED -Follow-up blood cultures, urine cultures -CMP CBC a.m. -General [facility] - NPO - 500ml bolus given in ED, cont IVF N/s + 40 KCL 50cc/hr x 15 hrs  (Cautious fluid with mild vascular congestion on x-ray and trace bilateral lower extremity edema) -IV morphine 2mg  PRN -Hold Eliquis  Pneumonia-shortness of breath, cough, leukocytosis 19.9. Rules in for sepsis but without lactic acidosis.  Recent hospitalization.  Chest x-ray shows left lower lobe consolidation with pleural effusion, left lower lobe mass noted on prior CT of indeterminate etiology, pulmonary vascular congestion. Influenza negative.  -Continue broad-spectrum antibiotics IV Vanco cefepime and metronidazole -Follow-up cultures  Hypokalemia-potassium 2.9. with normal Mag- 2.  Likely from poor p.o. intake, HCTZ. -Replete K -BMP a.m.  Elevated troponin- 0.06,  Likely demand ischemia in the setting of sepsis.  EKG unchanged.  Patient denies chest pain. - Trend Troponin  Left lower lobe lung mass- f/ws with Pulmonologist Dr. Sherene SiresWert. Per notes 01/11/2018-  This may represent a low-grade malignant neoplasm or benign liver tumor such as benign fibrous tumor of the pleura  >  Discussed at ov 01/11/2018 > declines bx > f/u 6 months with cxr only.   Atrial flutter/fibrillation-rates 103- 116.  And anticoagulated on Eliquis. -Continue home metoprolol as IV with holding parameters. -Hold Eliquis pending gen surgery evaluation  COPD-stable.  No wheezing on exam.  -PRN duo nebs  Hypertension- 130s to 160s.  -In the setting of sepsis, in case of decompensation, will continue metoprolol as IV but hold home Norvasc, losartan HCTZ, doxazosin.  CKD 3-  Cr stable at 1.4. - CMP a.m    DVT prophylaxis: SCDs Code Status: Full Family Communication: Daughter Katrina at bedside. Spouse would be primary decision maker.  Disposition Plan: Per rounding team Consults called: General surgery Admission status: Inpt, tele.  It is in my medical opinion that patient meets criteria for inpatient hospitalization, considering intra-abdominal and lung infection, with sepsis, requiring IV antibiotics and will likely require surgical intervention.   Onnie BoerEjiroghene E Enyah Moman MD [facility]  04/26/2018, 7:56 PM

## 2018-04-26 NOTE — ED Notes (Signed)
Patient's daughter signed consent for patient. Patient gave verbal consent.

## 2018-04-26 NOTE — ED Notes (Signed)
Respiratory contacted due to patient having audible wheezing.

## 2018-04-27 ENCOUNTER — Inpatient Hospital Stay (HOSPITAL_COMMUNITY): Payer: Medicare Other

## 2018-04-27 DIAGNOSIS — I480 Paroxysmal atrial fibrillation: Secondary | ICD-10-CM

## 2018-04-27 DIAGNOSIS — R6521 Severe sepsis with septic shock: Secondary | ICD-10-CM

## 2018-04-27 DIAGNOSIS — N183 Chronic kidney disease, stage 3 (moderate): Secondary | ICD-10-CM

## 2018-04-27 DIAGNOSIS — J9600 Acute respiratory failure, unspecified whether with hypoxia or hypercapnia: Secondary | ICD-10-CM

## 2018-04-27 DIAGNOSIS — A419 Sepsis, unspecified organism: Secondary | ICD-10-CM

## 2018-04-27 DIAGNOSIS — I959 Hypotension, unspecified: Secondary | ICD-10-CM

## 2018-04-27 DIAGNOSIS — K651 Peritoneal abscess: Secondary | ICD-10-CM

## 2018-04-27 DIAGNOSIS — K659 Peritonitis, unspecified: Secondary | ICD-10-CM

## 2018-04-27 LAB — COMPREHENSIVE METABOLIC PANEL
ALBUMIN: 1.9 g/dL — AB (ref 3.5–5.0)
ALT: 45 U/L — ABNORMAL HIGH (ref 0–44)
ALT: 45 U/L — ABNORMAL HIGH (ref 0–44)
AST: 50 U/L — ABNORMAL HIGH (ref 15–41)
AST: 47 U/L — AB (ref 15–41)
Albumin: 1.7 g/dL — ABNORMAL LOW (ref 3.5–5.0)
Alkaline Phosphatase: 120 U/L (ref 38–126)
Alkaline Phosphatase: 102 U/L (ref 38–126)
Anion gap: 13 (ref 5–15)
Anion gap: 12 (ref 5–15)
BILIRUBIN TOTAL: 2.1 mg/dL — AB (ref 0.3–1.2)
BUN: 21 mg/dL (ref 8–23)
BUN: 30 mg/dL — ABNORMAL HIGH (ref 8–23)
CO2: 20 mmol/L — ABNORMAL LOW (ref 22–32)
CO2: 19 mmol/L — ABNORMAL LOW (ref 22–32)
Calcium: 7.6 mg/dL — ABNORMAL LOW (ref 8.9–10.3)
Calcium: 7.2 mg/dL — ABNORMAL LOW (ref 8.9–10.3)
Chloride: 104 mmol/L (ref 98–111)
Chloride: 108 mmol/L (ref 98–111)
Creatinine, Ser: 1.84 mg/dL — ABNORMAL HIGH (ref 0.61–1.24)
Creatinine, Ser: 2.6 mg/dL — ABNORMAL HIGH (ref 0.61–1.24)
GFR calc Af Amer: 37 mL/min — ABNORMAL LOW (ref >60)
GFR calc Af Amer: 24 mL/min — ABNORMAL LOW (ref >60)
GFR calc non Af Amer: 32 mL/min — ABNORMAL LOW (ref >60)
GFR calc non Af Amer: 21 mL/min — ABNORMAL LOW (ref >60)
Glucose, Bld: 104 mg/dL — ABNORMAL HIGH (ref 70–99)
Glucose, Bld: 120 mg/dL — ABNORMAL HIGH (ref 70–99)
POTASSIUM: 3.5 mmol/L (ref 3.5–5.1)
POTASSIUM: 4.3 mmol/L (ref 3.5–5.1)
Sodium: 137 mmol/L (ref 135–145)
Sodium: 139 mmol/L (ref 135–145)
Total Bilirubin: 1.8 mg/dL — ABNORMAL HIGH (ref 0.3–1.2)
Total Protein: 5.4 g/dL — ABNORMAL LOW (ref 6.5–8.1)
Total Protein: 5.1 g/dL — ABNORMAL LOW (ref 6.5–8.1)

## 2018-04-27 LAB — TYPE AND SCREEN
ABO/RH(D): A POS
Antibody Screen: NEGATIVE

## 2018-04-27 LAB — POCT I-STAT 7, (LYTES, BLD GAS, ICA,H+H)
Acid-base deficit: 3 mmol/L — ABNORMAL HIGH (ref 0.0–2.0)
BICARBONATE: 20.7 mmol/L (ref 20.0–28.0)
Calcium, Ion: 0.97 mmol/L — ABNORMAL LOW (ref 1.15–1.40)
HEMATOCRIT: 28 % — AB (ref 39.0–52.0)
Hemoglobin: 9.5 g/dL — ABNORMAL LOW (ref 13.0–17.0)
O2 Saturation: 100 %
Potassium: 3.9 mmol/L (ref 3.5–5.1)
Sodium: 140 mmol/L (ref 135–145)
TCO2: 22 mmol/L (ref 22–32)
pCO2 arterial: 33.2 mmHg (ref 32.0–48.0)
pH, Arterial: 7.403 (ref 7.350–7.450)
pO2, Arterial: 252 mmHg — ABNORMAL HIGH (ref 83.0–108.0)

## 2018-04-27 LAB — BLOOD GAS, ARTERIAL
Acid-base deficit: 4.4 mmol/L — ABNORMAL HIGH (ref 0.0–2.0)
Bicarbonate: 19.1 mmol/L — ABNORMAL LOW (ref 20.0–28.0)
Drawn by: 448981
O2 Content: 3 L/min
O2 Saturation: 95.2 %
PATIENT TEMPERATURE: 100.4
pCO2 arterial: 30.2 mmHg — ABNORMAL LOW (ref 32.0–48.0)
pH, Arterial: 7.422 (ref 7.350–7.450)
pO2, Arterial: 79.2 mmHg — ABNORMAL LOW (ref 83.0–108.0)

## 2018-04-27 LAB — PROTIME-INR
INR: 2.3 — ABNORMAL HIGH (ref 0.8–1.2)
INR: 1.7 — AB (ref 0.8–1.2)
PROTHROMBIN TIME: 25.1 s — AB (ref 11.4–15.2)
PROTHROMBIN TIME: 20.1 s — AB (ref 11.4–15.2)

## 2018-04-27 LAB — LACTIC ACID, PLASMA
Lactic Acid, Venous: 4.2 mmol/L (ref 0.5–1.9)
Lactic Acid, Venous: 3.6 mmol/L (ref 0.5–1.9)

## 2018-04-27 LAB — URINE CULTURE: Culture: <10000 — AB

## 2018-04-27 LAB — CBC WITH DIFFERENTIAL/PLATELET
Abs Immature Granulocytes: 0.12 10*3/uL — ABNORMAL HIGH (ref 0.00–0.07)
Basophils Absolute: 0 10*3/uL (ref 0.0–0.1)
Basophils Relative: 0 %
Eosinophils Absolute: 0 10*3/uL (ref 0.0–0.5)
Eosinophils Relative: 0 %
HCT: 31.6 % — ABNORMAL LOW (ref 39.0–52.0)
Hemoglobin: 10.6 g/dL — ABNORMAL LOW (ref 13.0–17.0)
Immature Granulocytes: 1 %
LYMPHS ABS: 0.5 10*3/uL — AB (ref 0.7–4.0)
Lymphocytes Relative: 2 %
MCH: 31.3 pg (ref 26.0–34.0)
MCHC: 33.5 g/dL (ref 30.0–36.0)
MCV: 93.2 fL (ref 80.0–100.0)
MONOS PCT: 5 %
Monocytes Absolute: 0.9 10*3/uL (ref 0.1–1.0)
Neutro Abs: 17.7 10*3/uL — ABNORMAL HIGH (ref 1.7–7.7)
Neutrophils Relative %: 92 %
Platelets: 365 10*3/uL (ref 150–400)
RBC: 3.39 MIL/uL — ABNORMAL LOW (ref 4.22–5.81)
RDW: 12.7 % (ref 11.5–15.5)
WBC: 19.2 10*3/uL — ABNORMAL HIGH (ref 4.0–10.5)
nRBC: 0 % (ref 0.0–0.2)

## 2018-04-27 LAB — GLUCOSE, CAPILLARY
Glucose-Capillary: 98 mg/dL (ref 70–99)
Glucose-Capillary: 135 mg/dL — ABNORMAL HIGH (ref 70–99)
Glucose-Capillary: 125 mg/dL — ABNORMAL HIGH (ref 70–99)

## 2018-04-27 LAB — TROPONIN I
Troponin I: 0.26 ng/mL (ref <0.03)
Troponin I: 0.19 ng/mL (ref <0.03)

## 2018-04-27 LAB — CBC
HCT: 30.9 % — ABNORMAL LOW (ref 39.0–52.0)
Hemoglobin: 10.3 g/dL — ABNORMAL LOW (ref 13.0–17.0)
MCH: 30.4 pg (ref 26.0–34.0)
MCHC: 33.3 g/dL (ref 30.0–36.0)
MCV: 91.2 fL (ref 80.0–100.0)
Platelets: 366 10*3/uL (ref 150–400)
RBC: 3.39 MIL/uL — ABNORMAL LOW (ref 4.22–5.81)
RDW: 12.3 % (ref 11.5–15.5)
WBC: 13.4 10*3/uL — ABNORMAL HIGH (ref 4.0–10.5)
nRBC: 0 % (ref 0.0–0.2)

## 2018-04-27 LAB — ABO/RH: ABO/RH(D): A POS

## 2018-04-27 LAB — MRSA PCR SCREENING: MRSA by PCR: NEGATIVE

## 2018-04-27 MED ORDER — MIDAZOLAM HCL 2 MG/2ML IJ SOLN
INTRAMUSCULAR | Status: AC
  Administered 2018-04-27: 1 mg via INTRAVENOUS
  Filled 2018-04-27: qty 2

## 2018-04-27 MED ORDER — CHLORHEXIDINE GLUCONATE CLOTH 2 % EX PADS
6.0000 | MEDICATED_PAD | Freq: Every day | CUTANEOUS | Status: DC
  Administered 2018-04-28 – 2018-05-06 (×11): 6 via TOPICAL

## 2018-04-27 MED ORDER — SODIUM CHLORIDE 0.9 % IV SOLN
1.0000 g | INTRAVENOUS | Status: DC
  Administered 2018-04-27 – 2018-05-01 (×5): 1 g via INTRAVENOUS
  Filled 2018-04-27 (×6): qty 1

## 2018-04-27 MED ORDER — FENTANYL 2500MCG IN NS 250ML (10MCG/ML) PREMIX INFUSION
25.0000 ug/h | INTRAVENOUS | Status: DC
  Administered 2018-04-27: 50 ug/h via INTRAVENOUS
  Administered 2018-04-27 – 2018-04-28 (×2): 325 ug/h via INTRAVENOUS
  Administered 2018-04-28: 400 ug/h via INTRAVENOUS
  Administered 2018-04-28: 300 ug/h via INTRAVENOUS
  Administered 2018-04-29: 400 ug/h via INTRAVENOUS
  Administered 2018-04-29: 75 ug/h via INTRAVENOUS
  Administered 2018-04-30: 200 ug/h via INTRAVENOUS
  Filled 2018-04-27 (×8): qty 250

## 2018-04-27 MED ORDER — CHLORHEXIDINE GLUCONATE 0.12% ORAL RINSE (MEDLINE KIT)
15.0000 mL | Freq: Two times a day (BID) | OROMUCOSAL | Status: DC
  Administered 2018-04-27 – 2018-05-02 (×10): 15 mL via OROMUCOSAL

## 2018-04-27 MED ORDER — ENSURE ENLIVE PO LIQD: 237.0000 mL | Freq: Two times a day (BID) | ORAL | Status: DC

## 2018-04-27 MED ORDER — NOREPINEPHRINE 4 MG/250ML-% IV SOLN
0.0000 ug/min | INTRAVENOUS | Status: DC
  Administered 2018-04-27: 10 ug/min via INTRAVENOUS
  Administered 2018-04-27: 2 ug/min via INTRAVENOUS
  Administered 2018-04-28: 4 ug/min via INTRAVENOUS
  Filled 2018-04-27 (×4): qty 250

## 2018-04-27 MED ORDER — POTASSIUM CHLORIDE IN NACL 40-0.9 MEQ/L-% IV SOLN: INTRAVENOUS | Status: DC

## 2018-04-27 MED ORDER — MIDAZOLAM HCL 2 MG/2ML IJ SOLN
1.0000 mg | Freq: Once | INTRAMUSCULAR | Status: AC
  Administered 2018-04-27: 1 mg via INTRAVENOUS

## 2018-04-27 MED ORDER — FENTANYL CITRATE (PF) 100 MCG/2ML IJ SOLN
100.0000 ug | Freq: Once | INTRAMUSCULAR | Status: AC
  Administered 2018-04-27: 100 ug via INTRAVENOUS

## 2018-04-27 MED ORDER — FENTANYL CITRATE (PF) 100 MCG/2ML IJ SOLN
INTRAMUSCULAR | Status: AC
  Administered 2018-04-27: 100 ug via INTRAVENOUS
  Filled 2018-04-27: qty 2

## 2018-04-27 MED ORDER — LIDOCAINE HCL 1 % IJ SOLN
INTRAMUSCULAR | Status: AC
  Filled 2018-04-27: qty 20

## 2018-04-27 MED ORDER — SODIUM CHLORIDE 0.9% IV SOLUTION
Freq: Once | INTRAVENOUS | Status: AC
  Administered 2018-04-27: 13:00:00 via INTRAVENOUS

## 2018-04-27 MED ORDER — FENTANYL CITRATE (PF) 100 MCG/2ML IJ SOLN
50.0000 ug | Freq: Once | INTRAMUSCULAR | Status: AC
  Administered 2018-04-27: 50 ug via INTRAVENOUS
  Filled 2018-04-27: qty 2

## 2018-04-27 MED ORDER — ETOMIDATE 2 MG/ML IV SOLN
20.0000 mg | Freq: Once | INTRAVENOUS | Status: AC
  Administered 2018-04-27: 20 mg via INTRAVENOUS

## 2018-04-27 MED ORDER — BISACODYL 10 MG RE SUPP: 10.0000 mg | Freq: Every day | RECTAL | Status: DC | PRN

## 2018-04-27 MED ORDER — MIDAZOLAM HCL 2 MG/2ML IJ SOLN
1.0000 mg | INTRAMUSCULAR | Status: DC | PRN
  Administered 2018-04-27 – 2018-05-01 (×12): 1 mg via INTRAVENOUS
  Filled 2018-04-27 (×13): qty 2

## 2018-04-27 MED ORDER — DOCUSATE SODIUM 50 MG/5ML PO LIQD: 100.0000 mg | Freq: Two times a day (BID) | ORAL | Status: DC | PRN

## 2018-04-27 MED ORDER — INSULIN ASPART 100 UNIT/ML ~~LOC~~ SOLN
1.0000 [IU] | SUBCUTANEOUS | Status: DC
  Administered 2018-04-27 – 2018-05-01 (×7): 1 [IU] via SUBCUTANEOUS
  Administered 2018-05-03: 2 [IU] via SUBCUTANEOUS

## 2018-04-27 MED ORDER — SODIUM CHLORIDE 0.9 % IV BOLUS
1000.0000 mL | Freq: Once | INTRAVENOUS | Status: AC
  Administered 2018-04-27: 1000 mL via INTRAVENOUS

## 2018-04-27 MED ORDER — SODIUM CHLORIDE 0.9% FLUSH
10.0000 mL | Freq: Two times a day (BID) | INTRAVENOUS | Status: DC
  Administered 2018-04-27 – 2018-04-28 (×3): 10 mL
  Administered 2018-04-29: 20 mL
  Administered 2018-04-29 – 2018-05-04 (×10): 10 mL
  Administered 2018-05-04: 30 mL

## 2018-04-27 MED ORDER — FENTANYL BOLUS VIA INFUSION
25.0000 ug | INTRAVENOUS | Status: DC | PRN
  Administered 2018-04-28 – 2018-05-02 (×20): 25 ug via INTRAVENOUS
  Filled 2018-04-27: qty 25

## 2018-04-27 MED ORDER — SODIUM CHLORIDE 0.9% FLUSH
10.0000 mL | INTRAVENOUS | Status: DC | PRN
  Administered 2018-05-05: 20 mL
  Filled 2018-04-27: qty 40

## 2018-04-27 MED ORDER — SODIUM CHLORIDE 0.9% FLUSH
5.0000 mL | Freq: Three times a day (TID) | INTRAVENOUS | Status: DC
  Administered 2018-04-27 – 2018-05-06 (×29): 5 mL

## 2018-04-27 MED ORDER — POTASSIUM CHLORIDE IN NACL 40-0.9 MEQ/L-% IV SOLN
INTRAVENOUS | Status: DC
  Administered 2018-04-27: 100 mL/h via INTRAVENOUS
  Administered 2018-04-27 – 2018-04-29 (×4): 125 mL/h via INTRAVENOUS
  Filled 2018-04-27 (×8): qty 1000

## 2018-04-27 MED ORDER — PANTOPRAZOLE SODIUM 40 MG IV SOLR
40.0000 mg | INTRAVENOUS | Status: DC
  Administered 2018-04-27: 40 mg via INTRAVENOUS
  Filled 2018-04-27: qty 40

## 2018-04-27 MED ORDER — ZOLPIDEM TARTRATE 5 MG PO TABS
5.0000 mg | ORAL_TABLET | Freq: Once | ORAL | Status: AC
  Administered 2018-04-27: 5 mg via ORAL
  Filled 2018-04-27: qty 1

## 2018-04-27 MED ORDER — MIDAZOLAM HCL 2 MG/2ML IJ SOLN
INTRAMUSCULAR | Status: AC
  Filled 2018-04-27: qty 2

## 2018-04-27 MED ORDER — ORAL CARE MOUTH RINSE
15.0000 mL | OROMUCOSAL | Status: DC
  Administered 2018-04-27 – 2018-05-02 (×48): 15 mL via OROMUCOSAL

## 2018-04-27 MED ORDER — FENTANYL CITRATE (PF) 100 MCG/2ML IJ SOLN
INTRAMUSCULAR | Status: AC
  Filled 2018-04-27: qty 4

## 2018-04-27 MED ORDER — VITAMIN K1 10 MG/ML IJ SOLN
5.0000 mg | Freq: Once | INTRAVENOUS | Status: AC
  Administered 2018-04-27: 5 mg via INTRAVENOUS
  Filled 2018-04-27: qty 0.5

## 2018-04-27 NOTE — Progress Notes (Signed)
RT NOTE: RT pulled back ETT 3cm per order per chest xray. Vitals are stable. RT will continue to monitor.

## 2018-04-27 NOTE — Progress Notes (Signed)
Patient ID: Vincent Barrett, male   DOB: 1925/04/12, 85 y.o.   MRN: [id number] I was informed by the nurse that patient is very tachypneic and blood pressure is in the 80s.  I then evaluated the patient again.  He is drowsy, wakes up on calling his name and keeps asking when is the surgery.  On exam, his systolic blood pressure was in the high 80s.  I have ordered normal saline 1 L bolus.  He is tachypneic in the 40s.  His abdominal exam shows extreme tenderness diffusely even in the left side with some guarding as well.  His temperature is 100.7.  The nurses report that patient has been having intermittent cough with brown/bloody sputum.  I have ordered stat chest x-ray, ABG and stat labs including CBC/CMP/lactic acid/troponin.  I have called and spoken to Dr. Leeroy Bock Conner/general surgery and requested that patient be reevaluated because of worsening abdominal tenderness.  I have also communicated with Rinaldo Cloud Turpin/IR PA-C about the worsening condition and the fact that patient might need IR drainage today.  I have spoken to Dr. Kathy Breach Alva/CCM and have ordered 4 units of FFP's.  We will keep him n.p.o. for now.  Discontinue metoprolol.  Patient will be transferred to ICU under CCM service.  I have spoken to the patient's wife/Joyce Shuman on phone and updated her about patient's overall critical condition.  For now, she wants the patient to remain full code.  CRITICAL CARE Performed by: Glade Lloyd   Total critical care time: 60 minutes   Critical care was necessary to treat or prevent imminent or life-threatening deterioration.  Critical care was time spent personally by me on the following activities: development of treatment plan with patient and/or surrogate as well as nursing, discussions with consultants: General surgery/IR/CCM; evaluation of patient's response to treatment, examination of patient, obtaining history from patient or surrogate, ordering and performing treatments and interventions,  ordering and review of laboratory studies, ordering and review of radiographic studies, pulse oximetry and re-evaluation of patient's condition.

## 2018-04-27 NOTE — Progress Notes (Signed)
Pt transported to and from CT for procedure without event.  Rt will continue to monitor.

## 2018-04-27 NOTE — Significant Event (Signed)
Rapid Response Event Note RN called, pt hypotensive, lethargic, tachypniec  Overview: Time Called: 1218 Arrival Time: 1230 Event Type: Neurologic, Hypotension, Respiratory  Initial Focused Assessment: On arrival pt arousable to noxious stimuli only, skin, cool,clammy and pale, RR 48-52, manual SBP 80's, Sats 92-94 4L [place], coughing brown/bloody sputum. abd tender and distended.   Interventions:  NS bolus x1- SBP 107 CXR ABG 7.4/33.2/252.0/20.7 Transferred to ICU  CBC, CMP, Type and screen  Event Summary: Name of Physician Notified: Dr.  at 1235  Name of Consulting Physician Notified: Dr. Vassie Loll  at 1240  Outcome: Transferred (Comment)([id number])     Phillips Grout, Sandi Carne

## 2018-04-27 NOTE — Progress Notes (Signed)
Patient ID: Vincent Barrett, male   DOB: January 23, 1926, 85 y.o.   MRN: [id number]  PROGRESS NOTE    Vincent Barrett  NWG:[id number] DOB: 1925-11-23 DOA: 04/26/2018 PCP: [name], MD   Brief Narrative:  85 year old male with history of paroxysmal atrial fibrillation on Eliquis, hypertension, COPD, CKD stage III, BPH, recent acute calculus cholecystitis status post laparoscopic cholecystectomy on 04/16/2018 followed by ERCP with biliary sphincterotomy and balloon extraction of the CBD stone on 04/22/2018 and subsequent discharge afterwards presented with abdominal pain and shortness of breath on 04/26/2018.  He was found to be febrile and tachycardic with significant leukocytosis.  CT abdomen and pelvis showed possible gallbladder fossa abscess versus biloma.  He was started on broad-spectrum antibiotics.  General surgery was consulted who recommended IR consult.  Assessment & Plan:   Active Problems:   Sepsis (HCC)  Sepsis: Present on admission -Probably from complicated intra-abdominal infection -Antibiotics plan as below.  Follow cultures.  Continue IV fluids.  Complicated intra-abdominal abscess/biloma -Patient recently had laparoscopic cholecystectomy on 04/16/2018 followed by ERCP/EUS with sphincterotomy and balloon extraction of the CBD stone on 04/22/2018. -CT abdomen and pelvis on admission showed possible gallbladder fossa abscess versus biloma. -Started on cefepime, Flagyl and vancomycin.  DC vancomycin.  Follow cultures.  General surgery following and recommended IR guided aspiration.  INR is 2.3.  Communicated with Ralene Muskrat, PA-C with IR and patient will be given vitamin K IV 5 mg 1 dose today, repeat INR in a.m. and possible IR guided drainage for tomorrow. -We will put him on clear liquid diet for now and n.p.o. past midnight.  Pain management.  Left lower lobar pneumonia -Surgery showed left lower lobe consolidation with pleural effusion -Antibiotics as above. -Influenza negative.   Follow cultures.  Left lower lobe lung mass -Outpatient follow-up with pulmonary.  As per Dr. 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  Patient had declined biopsy.  Outpatient follow-up with pulmonary  Paroxysmal atrial fibrillation -Eliquis on hold.  Slightly tachycardic.  Use metoprolol for now IV  COPD -Stable.  PRN duo nebs  Hypertension -Blood pressure on the lower side.  Use IV metoprolol with holding parameters but other antihypertensives including Norvasc, losartan, hydrochlorothiazide and doxazosin are on hold.  Chronic kidney disease stage III -Creatinine stable.  Monitor  Recent calculus cholecystitis and choledocholithiasis -Status post laparoscopic cholecystectomy on 04/16/2018 and EUS and ERCP on 04/22/2018 with biliary enterotomy and balloon extraction of the CBD stone. -If the drainage by IR reveals frank bile, patient might need HIDA scan to rule out biliary leak.  Generalized deconditioning -We will need PT evaluation once he undergoes IR guided drainage.  We will also get palliative care team to discuss goals of care.  Currently he is full code.  DVT prophylaxis: SCDs Code Status: Full Family Communication: None at bedside Disposition Plan: Depends on clinical outcome  Consultants: General surgery/IR  Procedures: None  Antimicrobials: Vancomycin, cefepime, Flagyl from 04/26/2018 onwards   Subjective: Patient seen and examined at bedside.  He complains of abdominal pain.  Currently is a little nauseous but no vomiting.  Had fevers overnight.  Objective: Vitals:   04/26/18 2356 04/27/18 0000 04/27/18 0406 04/27/18 0509  BP:   (!) 104/58 117/60  Pulse:   (!) 108 (!) 107  Resp:   20   Temp:   99.3 F (37.4 C)   TempSrc:   Oral   SpO2:  99% 97%   Weight: 79.5 kg     Height: 5\' 4"  (1.626 m)       Intake/Output Summary (Last 24 hours) at 04/27/2018 0930  Last data filed at 04/27/2018 5003 Gross per 24 hour  Intake 1866.04 ml  Output 400 ml  Net 1466.04 ml   Filed Weights   04/26/18 2356  Weight: 79.5 kg    Examination:  General exam: Appears calm and comfortable.  Answers some questions but is a poor historian. Respiratory system: Bilateral decreased breath sounds at bases with some scattered crackles Cardiovascular system: S1 & S2 heard, tachycardic  gastrointestinal system: Abdomen is nondistended, soft and extremely tender in the right upper quadrant.  No rebound tenderness. Normal bowel sounds heard. Extremities: No cyanosis, clubbing, edema  Central nervous system: Alert and oriented, poor historian. No focal neurological deficits. Moving extremities Skin: No rashes, lesions or ulcers Psychiatry: Looks anxious.    Data Reviewed: I have personally reviewed following labs and imaging studies  CBC: Recent Labs  Lab 04/22/18 0336 04/23/18 0420 04/26/18 1508 04/27/18 0336  WBC 11.1* 12.2* 19.9* 13.4*  NEUTROABS 8.1* 9.0* 17.5*  --   HGB 10.8* 11.4* 11.2* 10.3*  HCT 31.7* 33.1* 33.3* 30.9*  MCV 91.9 91.7 93.8 91.2  PLT 254 304 408* 366   Basic Metabolic Panel: Recent Labs  Lab 04/21/18 0329 04/22/18 0336 04/23/18 0420 04/26/18 1508 04/27/18 0336  NA  --  138 137 135 137  K  --  3.9 3.8 2.9* 3.5  CL  --  107 106 99 104  CO2  --  23 22 24  20*  GLUCOSE  --  101* 112* 118* 104*  BUN  --  13 17 21 21   CREATININE  --  1.41* 1.43* 1.42* 1.84*  CALCIUM  --  8.2* 8.4* 8.2* 7.6*  MG 1.9 1.7 1.7 2.0  --    GFR: Estimated Creatinine Clearance: 25.4 mL/min (A) (by C-G formula based on SCr of 1.84 mg/dL (H)). Liver Function Tests: Recent Labs  Lab 04/22/18 0336 04/23/18 0420 04/26/18 1508 04/27/18 0336  AST 31 30 53* 50*  ALT 60* 54* 58* 45*  ALKPHOS 152* 147* 159* 120  BILITOT 1.3* 1.4* 1.5* 2.1*  PROT 5.8* 5.7* 6.8 5.4*  ALBUMIN 2.3* 2.3* 2.8* 1.9*   Recent Labs  Lab 04/26/18 1508  LIPASE 35   No  results for input(s): AMMONIA in the last 168 hours. Coagulation Profile: Recent Labs  Lab 04/26/18 1508 04/27/18 0336  INR 2.2* 2.3*   Cardiac Enzymes: Recent Labs  Lab 04/26/18 1508 04/26/18 2103  TROPONINI 0.06* 0.10*   BNP (last 3 results) No results for input(s): PROBNP in the last 8760 hours. HbA1C: No results for input(s): HGBA1C in the last 72 hours. CBG: No results for input(s): GLUCAP in the last 168 hours. Lipid Profile: No results for input(s): CHOL, HDL, LDLCALC, TRIG, CHOLHDL, LDLDIRECT in the last 72 hours. Thyroid Function Tests: No results for input(s): TSH, T4TOTAL, FREET4, T3FREE, THYROIDAB in the last 72 hours. Anemia Panel: No results for input(s): VITAMINB12, FOLATE, FERRITIN, TIBC, IRON, RETICCTPCT in the last 72 hours. Sepsis Labs: Recent Labs  Lab 04/26/18 1508 04/26/18 1727  LATICACIDVEN 1.5 1.0    Recent Results (from the past 240 hour(s))  Culture, blood (routine x 2)     Status: None (Preliminary result)   Collection Time: 04/26/18  3:08 PM  Result Value Ref Range Status   Specimen Description BLOOD RIGHT ARM  Final   Special Requests  Final    BOTTLES DRAWN AEROBIC AND ANAEROBIC Blood Culture adequate volume   Culture   Final    NO GROWTH < 24 HOURS Performed at [facility], [address], [place], [place] [address]    Report Status PENDING  Incomplete  Culture, blood (routine x 2)     Status: None (Preliminary result)   Collection Time: 04/26/18  3:16 PM  Result Value Ref Range Status   Specimen Description BLOOD LEFT ARM DRAWN BY RN  Final   Special Requests   Final    BOTTLES DRAWN AEROBIC AND ANAEROBIC Blood Culture adequate volume   Culture   Final    NO GROWTH < 24 HOURS Performed at [facility], [address], [place], [place] [address]    Report Status PENDING  Incomplete         Radiology Studies: Ct Abdomen Pelvis Wo Contrast  Result Date: 04/26/2018 CLINICAL DATA:  Gallbladder removed Friday. Nonproductive  cough. Elevated temperature. Abdominal pain. EXAM: CT ABDOMEN AND PELVIS WITHOUT CONTRAST TECHNIQUE: Multidetector CT imaging of the abdomen and pelvis was performed following the standard protocol without IV contrast. COMPARISON:  CT abdomen 04/15/2018 FINDINGS: Lower chest: Bilateral small pleural effusions. Left lower lobe airspace disease likely reflecting atelectasis. Hepatobiliary: Normal liver. No intrahepatic biliary ductal dilatation. No hepatic mass. Interval cholecystectomy. 7.6 x 10 x 17 cm fluid collection emanating from the gallbladder fossa and tracking posteriorly with a few bubbles of air within the collection which may reflect a biloma or abscess. Mild inflammatory changes along the inferior margin of the liver. Small amount of perihepatic fluid. Pancreas: Unremarkable. No pancreatic ductal dilatation or surrounding inflammatory changes. Spleen: Normal in size without focal abnormality. Adrenals/Urinary Tract: Adrenal glands are unremarkable. Kidneys are normal, without renal calculi, focal lesion, or hydronephrosis. Bladder is unremarkable. Stomach/Bowel: Stomach is within normal limits. Appendix appears normal. No evidence of bowel wall thickening, distention, or inflammatory changes. Diverticulosis without evidence of diverticulitis. Vascular/Lymphatic: Normal caliber abdominal aorta with atherosclerosis. No lymphadenopathy. Reproductive: Prostate is unremarkable. Other: No fluid collection or hematoma. Small amount of pelvic free fluid. Musculoskeletal: No acute osseous abnormality. No aggressive osseous lesion. Chronic compression fracture of L1 vertebral body. Diffuse thoracolumbar spondylosis. IMPRESSION: 1. Interval cholecystectomy. 7.6 x 10 x 17 cm fluid collection emanating from the gallbladder fossa and tracking posteriorly with a few bubbles of air within the collection which may reflect a biloma versus abscess. Electronically Signed   By: Elige Ko   On: 04/26/2018 17:29   Dg  Chest 2 View  Result Date: 04/26/2018 CLINICAL DATA:  85 year old male with cough.  Initial encounter. EXAM: CHEST - 2 VIEW COMPARISON:  12/30/2017 CT.  12/23/2017 chest x-ray. FINDINGS: Cardiomegaly.  Pulmonary vascular congestion. Left lower lobe consolidation with pleural effusion. Left lower lobe mass as noted on prior CT of indeterminate etiology. Right lower lobe nodule and scattered granulomas better delineated on prior CT. Calcified aorta. No obvious acute osseous abnormality although evaluation limited. Prior compression fracture L1. IMPRESSION: 1. Left lower lobe consolidation with pleural effusion. 2. Left lower lobe mass as noted on prior CT of indeterminate etiology. 3. Cardiomegaly.  Pulmonary vascular congestion. 4. Right lower lobe nodule and scattered granulomas better delineated on prior CT. 5.  Aortic Atherosclerosis (ICD10-I70.0). Electronically Signed   By: Lacy Duverney M.D.   On: 04/26/2018 18:11        Scheduled Meds: . acetaminophen      . feeding supplement (ENSURE ENLIVE)  237 mL Oral BID BM  .  metoprolol tartrate  5 mg Intravenous Q6H   Continuous Infusions: . 0.9 % NaCl with KCl 40 mEq / L 100 mL/hr (04/27/18 0745)  . ceFEPime (MAXIPIME) IV    . metronidazole 500 mg (04/27/18 0515)  . phytonadione (VITAMIN K) IV       LOS: 1 day        Glade Lloyd, MD [facility] 04/27/2018, 9:30 AM

## 2018-04-27 NOTE — Progress Notes (Signed)
Pt's wife took home with her pt's belonging bag including dentures and hearing aides.  Eston Mould, Charity fundraiser

## 2018-04-27 NOTE — Consult Note (Signed)
NAME:  Vincent Barrett, MRN:  [id number], DOB:  03-19-1925, LOS: 1 ADMISSION DATE:  04/26/2018, CONSULTATION DATE:  04/27/2018 REFERRING MD: Hanley Ben, CHIEF COMPLAINT:  Respiratory Distress, Suspected sepsis   Brief History   85 year old male with history of paroxysmal atrial fibrillation on Eliquis, hypertension, COPD, CKD stage III, BPH, recent acute calculus cholecystitis status post laparoscopic cholecystectomy on 04/16/2018 followed by ERCP with biliary sphincterotomy and balloon extraction of the CBD stone on 04/22/2018 and subsequent discharge afterwards presented with abdominal pain and shortness of breath on 04/26/2018.  He was found to be febrile and tachycardic with significant leukocytosis.  CT abdomen and pelvis showed possible gallbladder fossa abscess versus biloma.  He was started on broad-spectrum antibiotics.  General surgery was consulted who recommended IR consult.   History of present illness   85 year old male with history of paroxysmal atrial fibrillation on Eliquis, hypertension, COPD, CKD stage III, BPH, recent acute calculus cholecystitis status post laparoscopic cholecystectomy on 04/16/2018 followed by ERCP with biliary sphincterotomy and balloon extraction of the CBD stone on 04/22/2018 and subsequent discharge afterwards presented with abdominal pain and shortness of breath on 04/26/2018.  He was found to be febrile and tachycardic with significant leukocytosis.  CT abdomen and pelvis showed possible gallbladder fossa abscess versus biloma.  He was started on broad-spectrum antibiotics.  General surgery was consulted who recommended IR consult. IR planned to give Vitamin K and 4 units FFP, and evaluate INR and patient in the morning. Dr. Vassie Loll has called surgery to re-evaluate for source control at an earlier date. Pt has subsequently decompensated. He has developed respiratory distress, with RR of 48 . CXR shows low lung volumes with LLL consolidation suspicious for pneumonia, small pleural  effusions. He sounds wet. Lactate is 4.2 up from 1.0 on 3/16. Max Lactate was 1.5 on 3/16 am. He will receive 4 units FFP. He is hypotensive at present, WBC has increased from 13.4 to 19.2 in the last 6 hours.Suspect this has evolved into peritonitis.Patient has been transferred to the ICU and PCCM have been consulted for management of care.Per nursing , the family does want everything done.   Past Medical History   Past Medical History:  Diagnosis Date  . BPH (benign prostatic hyperplasia)   . COPD (chronic obstructive pulmonary disease) (HCC)   . Hypertension   . OA (osteoarthritis)   . PAF (paroxysmal atrial fibrillation) (HCC)     Significant Hospital Events   3/16 Admission 3/17 Transfer to ICU 3/17 Intubation  Consults:  3/17>> IR 3/17>> Surgery  Procedures:  3/17>> Intubation 3/17 L IJ CVC 3/12 ERCP 3/12 Upper Endoscopy  Significant Diagnostic Tests:  3/16>>CT Abdomen/ Pelvis Interval cholecystectomy. 7.6 x 10 x 17 cm fluid collection emanating from the gallbladder fossa and tracking posteriorly with a few bubbles of air within the collection which may reflect a biloma versus abscess.  Micro Data:  3/16 Blood >> 3/16 Urine >> 3/17 Urine Strep > 3/17 Urine Legionella  Antimicrobials:  Cefepime 3/17>> Flagyl 3/17>> Vanc 3/16 >>   Interim history/subjective:  Intubated and Central Line placed for FFP and Levophed infusions.  ABG pending CXR for CVC placement pending  Objective   Blood pressure (!) 107/57, pulse (!) 116, temperature 99 F (37.2 C), temperature source Axillary, resp. rate (!) 46, height  (1.626 m), weight 79.5 kg, SpO2 98 %.        Intake/Output Summary (Last 24 hours) at 04/27/2018 1404 Last data filed at 04/27/2018 0337 Gross per 24 hour  Intake 1866.04 ml  Output 400 ml  Net 1466.04 ml   Filed Weights   04/26/18 2356  Weight: 79.5 kg    Examination: General: Intubated and sedated , older 85 year old male HENT: NCAT, No  LAD, + JVD Lungs:Bilateral chest excursion, diminished per bases Cardiovascular: S1, S2, RRR, NO RMG Abdomen: Firm, Tender, Distended, BS - Extremities: Cool  to touch, no mottling, normal bulk, no obvious deformities Neuro: Awake and alert pre-intubation, following commands, MAE x 4 GU: Condom cath  Resolved Hospital Problem list     Assessment & Plan:  Acute Respiratory Failure in setting of suspected LLL pneumonia , small pleural effusions, and suspected peritonitis History of COPD Intubated 3/17>> Suspected aspiration Plan: ABG now and 3/18  am CXR now to confirm ETT placement Adjust PEEP and FiO2 as needed Saturation goals are 88-92% CXR in am and PRN Fentanyl gtt  for sedation RASS goal of -1 Full support for now WUA 3/18 am  Septic Shock 2/2 acute abdomen Hypotensive Leukocytosis T Max 103.5 Lactate 4.2 on 3/17 Distended abdomen contributing to low lung  Plan: 2  L IVF 3/17, in addition to 4 U PRBC's  Maintain MAP of > 65 Titrate Levophed above Trend troponins Trend Lactate Consider Echo Hold Lopressor for now Check PCT Central line for vasopressors and fluid resuscitation ABX as above ( Cefepime, flagyl, vanc) Follow Micro Urine for legionella and Strep Trend WBC and Fever Curve Culture as is clinically indicated Surgery consult for source control  Acute Kidney Injury Creatinine up trending to 2.60 on 3/17 Plan Aggressive hydration  Trend BMET Avoid nephrotoxic medications Maintain renal perfusion ( MAP goal of > 65) Monitor UO Monitor and Replete electrolytes as needed   Acute Abdomen 2/2 peritonitis Plan: Per Surgery>> Source control ( Consulted) ABX as above OG tube to low intermittent suction for decompression NPO TF/ TNA  when appropriate for nutrition   Elevated INR  Last Eliquis 3/16 at 7 am Plan: Reversal of Eliquis for surgical source control Vitamin K,  5 mg given  FFP 4 units  Recheck INR at 1800 INR in am Trend CBC   Endocrine Plan: CBG's Q 4 SSI      Best practice:  Diet: NPO Pain/Anxiety/Delirium protocol (if indicated): Fentanyl gtt/ Versed prn VAP protocol (if indicated): Ordered DVT prophylaxis: INR > 2.0 , PAS hose GI prophylaxis: Protonix Glucose control: CBG/ SSI Mobility: BR Code Status: Full Family Communication: Family updated by Dr. Vassie Loll 3/17 Disposition: ICU  Labs   CBC: Recent Labs  Lab 04/22/18 0336 04/23/18 0420 04/26/18 1508 04/27/18 0336 04/27/18 1319  WBC 11.1* 12.2* 19.9* 13.4* 19.2*  NEUTROABS 8.1* 9.0* 17.5*  --  PENDING  HGB 10.8* 11.4* 11.2* 10.3* 10.6*  HCT 31.7* 33.1* 33.3* 30.9* 31.6*  MCV 91.9 91.7 93.8 91.2 93.2  PLT 254 304 408* 366 365    Basic Metabolic Panel: Recent Labs  Lab 04/21/18 0329 04/22/18 0336 04/23/18 0420 04/26/18 1508 04/27/18 0336  NA  --  138 137 135 137  K  --  3.9 3.8 2.9* 3.5  CL  --  107 106 99 104  CO2  --  23 22 24  20*  GLUCOSE  --  101* 112* 118* 104*  BUN  --  13 17 21 21   CREATININE  --  1.41* 1.43* 1.42* 1.84*  CALCIUM  --  8.2* 8.4* 8.2* 7.6*  MG 1.9 1.7 1.7 2.0  --    GFR: Estimated Creatinine Clearance: 25.4 mL/min (A) (  by C-G formula based on SCr of 1.84 mg/dL (H)). Recent Labs  Lab 04/23/18 0420 04/26/18 1508 04/26/18 1727 04/27/18 0336 04/27/18 1319  WBC 12.2* 19.9*  --  13.4* 19.2*  LATICACIDVEN  --  1.5 1.0  --  4.2*    Liver Function Tests: Recent Labs  Lab 04/22/18 0336 04/23/18 0420 04/26/18 1508 04/27/18 0336  AST 31 30 53* 50*  ALT 60* 54* 58* 45*  ALKPHOS 152* 147* 159* 120  BILITOT 1.3* 1.4* 1.5* 2.1*  PROT 5.8* 5.7* 6.8 5.4*  ALBUMIN 2.3* 2.3* 2.8* 1.9*   Recent Labs  Lab 04/26/18 1508  LIPASE 35   No results for input(s): AMMONIA in the last 168 hours.  ABG    Component Value Date/Time   PHART 7.422 04/27/2018 1315   PCO2ART 30.2 (L) 04/27/2018 1315   PO2ART 79.2 (L) 04/27/2018 1315   HCO3 19.1 (L) 04/27/2018 1315   ACIDBASEDEF 4.4 (H) 04/27/2018 1315    O2SAT 95.2 04/27/2018 1315     Coagulation Profile: Recent Labs  Lab 04/26/18 1508 04/27/18 0336  INR 2.2* 2.3*    Cardiac Enzymes: Recent Labs  Lab 04/26/18 1508 04/26/18 2103  TROPONINI 0.06* 0.10*    HbA1C: No results found for: HGBA1C  CBG: No results for input(s): GLUCAP in the last 168 hours.  Review of Systems:   Unable, sedated and intubated  Past Medical History  He,  has a past medical history of BPH (benign prostatic hyperplasia), COPD (chronic obstructive pulmonary disease) (HCC), Hypertension, OA (osteoarthritis), and PAF (paroxysmal atrial fibrillation) (HCC).   Surgical History    Past Surgical History:  Procedure Laterality Date  . CARDIOVERSION N/A 03/31/2017   Procedure: CARDIOVERSION;  Surgeon: Lars Masson, MD;  Location: [facility] ENDOSCOPY;  Service: Cardiovascular;  Laterality: N/A;  . CARPAL TUNNEL RELEASE  02/2010   Dr. Franky Macho  . CHOLECYSTECTOMY N/A 04/16/2018   Procedure: LAPAROSCOPIC CHOLECYSTECTOMY WITH INTRAOPERATIVE CHOLANGIOGRAM;  Surgeon: Manus Rudd, MD;  Location: [facility] OR;  Service: General;  Laterality: N/A;  . ERCP N/A 04/22/2018   Procedure: ENDOSCOPIC RETROGRADE CHOLANGIOPANCREATOGRAPHY (ERCP);  Surgeon: Willis Modena, MD;  Location: [facility] ENDOSCOPY;  Service: Endoscopy;  Laterality: N/A;  . ESOPHAGOGASTRODUODENOSCOPY (EGD) WITH PROPOFOL N/A 04/22/2018   Procedure: ESOPHAGOGASTRODUODENOSCOPY (EGD) WITH PROPOFOL;  Surgeon: Willis Modena, MD;  Location: [facility] ENDOSCOPY;  Service: Endoscopy;  Laterality: N/A;  . EUS N/A 04/22/2018   Procedure: UPPER ENDOSCOPIC ULTRASOUND (EUS) RADIAL;  Surgeon: Willis Modena, MD;  Location: [facility] ENDOSCOPY;  Service: Endoscopy;  Laterality: N/A;  . NM MYOCAR PERF WALL MOTION  08/08/2011   lexiscan; normal pattern of perfusion in all regions; post-stress EF 69%; low risk scan   . REMOVAL OF STONES  04/22/2018   Procedure: REMOVAL OF STONES;  Surgeon: Willis Modena, MD;  Location: [facility] ENDOSCOPY;  Service:  Endoscopy;;  . SPHINCTEROTOMY  04/22/2018   Procedure: Dennison Mascot;  Surgeon: Willis Modena, MD;  Location: [facility] ENDOSCOPY;  Service: Endoscopy;;  . TRANSTHORACIC ECHOCARDIOGRAM  2013   EF 52%, mild AV leaflet thickening; trace TR     Social History   reports that he quit smoking about 61 years ago. His smoking use included cigarettes. He has a 22.00 pack-year smoking history. He has never used smokeless tobacco. He reports that he does not drink alcohol or use drugs.   Family History   His family history is negative for Heart disease.   Allergies Allergies  Allergen Reactions  . Quinine Nausea And Vomiting     Home Medications  Prior to Admission medications   Medication Sig Start Date End Date Taking? Authorizing Provider  amLODipine (NORVASC) 2.5 MG tablet Take 1 tablet (2.5 mg total) by mouth daily. 02/21/16  Yes Hilty, Lisette Abu, MD  Ascorbic Acid (VITAMIN C) 1000 MG tablet Take 1,000 mg by mouth daily.   Yes [provider]  doxazosin (CARDURA) 4 MG tablet Take 1 tablet (4 mg total) by mouth daily. KEEP OV. 01/16/17  Yes Hilty, Lisette Abu, MD  ELIQUIS 5 MG TABS tablet TAKE 1 TABLET BY MOUTH TWICE A DAY 04/26/18  Yes Hilty, Lisette Abu, MD  losartan-hydrochlorothiazide (HYZAAR) 100-25 MG tablet Take 1 tablet by mouth daily. 01/18/18  Yes Hilty, Lisette Abu, MD  metoprolol succinate (TOPROL-XL) 25 MG 24 hr tablet TAKE 1 TABLET (25 MG TOTAL) BY MOUTH DAILY. 04/13/17  Yes Hilty, Lisette Abu, MD  Multiple Vitamins-Minerals (PRESERVISION AREDS 2) CAPS Take 1 capsule by mouth daily.   Yes [provider]  ondansetron (ZOFRAN) 4 MG tablet Take 1 tablet (4 mg total) by mouth every 6 (six) hours as needed for nausea. 04/23/18  Yes Glade Lloyd, MD  traMADol (ULTRAM) 50 MG tablet Take 1 tablet (50 mg total) by mouth every 6 (six) hours as needed for severe pain. 04/23/18 04/23/19 Yes Glade Lloyd, MD     Critical care APP time: 38 minutes   Bevelyn Ngo, MSN, AGACNP-BC  [facility] Pulmonary/Critical Care Medicine Pager # [phone] If no answer [phone] 04/27/2018 5:07 PM

## 2018-04-27 NOTE — Consult Note (Signed)
85 year old man with CKD, prior laparoscopic cholecystectomy on 04/16/2018 followed by ERCP with biliary sphincterotomy and balloon extraction of CBD stone on 3/12 admitted 3/16 with abdominal pain and shortness of breath and sepsis syndrome with fever and leukocytosis. CT abdomen showed collection in the gallbladder fossa, IR was consulted with plan to drain on 3/18 once INR had been corrected.  Last dose of Eliquis presumably on 3/15  I was called emergently consult on him due to extremis with hypotension and respiratory distress. Exam-able to speak in full sentences, accessory muscle use, decreased breath sounds bilateral, distended abdomen, diffusely tender and guarding, no rigidity, cool extremities, no edema, dry mucosa  Labs noted Chest x-ray reviewed does not show any new infiltrates.  Impression/plan  Acute abdomen/peritonitis -surgery has been called, will correct INR with 4 units FFP in anticipation that he will need a procedure. Last dose of Eliquis presumably on 3/15. Continue cefepime Flagyl  Acute respiratory failure-due to increased work of breathing, he was emergently intubated Await chest x-ray/ABG Procedure complicated by aspiration. OG tube was placed and will be placed to low intermittent suction Use fentanyl drip for sedation  Septic shock-Levophed added central line replaced, lactate elevated  AKI -hydrate aggressively Avoid nephrotoxins, monitor urine output  Elevated troponin-follow  Guarded prognosis given to family.  CODE STATUS discussed with patient, he desires full resuscitation including emergent surgery if needed  The patient is critically ill with multiple organ systems failure and requires high complexity decision making for assessment and support, frequent evaluation and titration of therapies, application of advanced monitoring technologies and extensive interpretation of multiple databases. Critical Care Time devoted to patient care services described  in this note independent of APP/resident  time is 60 minutes.   Comer Locket Vassie Loll MD

## 2018-04-27 NOTE — Procedures (Signed)
Central Venous Catheter Insertion Procedure Note Dornell Jacque [id number] [birth year]  Procedure: Insertion of Central Venous Catheter Indications: Assessment of intravascular volume, Drug and/or fluid administration and Frequent blood sampling  Procedure Details Consent: Risks of procedure as well as the alternatives and risks of each were explained to the (patient/caregiver).  Consent for procedure obtained. Time Out: Verified patient identification, verified procedure, site/side was marked, verified correct patient position, special equipment/implants available, medications/allergies/relevent history reviewed, required imaging and test results available.  Performed  Maximum sterile technique was used including antiseptics, cap, gloves, gown, hand hygiene, mask and sheet. Skin prep: Chlorhexidine; local anesthetic administered A antimicrobial bonded/coated triple lumen catheter was placed in the left internal jugular vein using the Seldinger technique.  Evaluation Blood flow good Complications: No apparent complications Patient did tolerate procedure well. Chest X-ray ordered to verify placement.  CXR: pending.  Left IJ CVC was placed using [place] for  live time visualization of vessel during line placement. Dr. Vassie Loll was present and supervised line placement. Pt. Tolerated procedure well. CXR is pending.  Bevelyn Ngo 04/27/2018, 4:11 PM

## 2018-04-27 NOTE — Procedures (Signed)
  Procedure: CT RUQ subhepatic biloma drain 20f EBL:   minimal Complications:  none immediate  See full dictation in [employer].  Thora Lance MD Main # [phone] Pager  [phone]

## 2018-04-27 NOTE — Consult Note (Signed)
Chief Complaint: Patient was seen in consultation today for intra abdominal abscess drain placement Chief Complaint  Patient presents with   Fever   at the request of Dr Elwyn Lade  Referring Physician(s): Dr Geannie Risen  Supervising Physician: Oley Balm  Patient Status: [facility] - In-pt  History of Present Illness: Vincent Barrett is a 85 y.o. male   Cholecystectomy 04/16/18 ERCP 04/22/18 Discharged doing well last Fri Increasing Rt and pain and Nausea To ED and Transfer to [facility]  CT yesterday:   IMPRESSION: 1. Interval cholecystectomy. 7.6 x 10 x 17 cm fluid collection emanating from the gallbladder fossa and tracking posteriorly with a few bubbles of air within the collection which may reflect a biloma versus abscess.  Request now for drain placement Dr Deanne Coffer has reviewed imaging and approves procedure INR 2.3 today Discussed with Dr Hanley Ben-- he will Vit K today I will recheck INR in am-- plan for procedure 3/18 am   Past Medical History:  Diagnosis Date   BPH (benign prostatic hyperplasia)    COPD (chronic obstructive pulmonary disease) (HCC)    Hypertension    OA (osteoarthritis)    PAF (paroxysmal atrial fibrillation) (HCC)     Past Surgical History:  Procedure Laterality Date   CARDIOVERSION N/A 03/31/2017   Procedure: CARDIOVERSION;  Surgeon: Lars Masson, MD;  Location: [facility] ENDOSCOPY;  Service: Cardiovascular;  Laterality: N/A;   CARPAL TUNNEL RELEASE  02/2010   Dr. Franky Macho   CHOLECYSTECTOMY N/A 04/16/2018   Procedure: LAPAROSCOPIC CHOLECYSTECTOMY WITH INTRAOPERATIVE CHOLANGIOGRAM;  Surgeon: Manus Rudd, MD;  Location: [facility] OR;  Service: General;  Laterality: N/A;   ERCP N/A 04/22/2018   Procedure: ENDOSCOPIC RETROGRADE CHOLANGIOPANCREATOGRAPHY (ERCP);  Surgeon: Willis Modena, MD;  Location: [facility] ENDOSCOPY;  Service: Endoscopy;  Laterality: N/A;   ESOPHAGOGASTRODUODENOSCOPY (EGD) WITH PROPOFOL N/A 04/22/2018   Procedure:  ESOPHAGOGASTRODUODENOSCOPY (EGD) WITH PROPOFOL;  Surgeon: Willis Modena, MD;  Location: [facility] ENDOSCOPY;  Service: Endoscopy;  Laterality: N/A;   EUS N/A 04/22/2018   Procedure: UPPER ENDOSCOPIC ULTRASOUND (EUS) RADIAL;  Surgeon: Willis Modena, MD;  Location: [facility] ENDOSCOPY;  Service: Endoscopy;  Laterality: N/A;   NM MYOCAR PERF WALL MOTION  08/08/2011   lexiscan; normal pattern of perfusion in all regions; post-stress EF 69%; low risk scan    REMOVAL OF STONES  04/22/2018   Procedure: REMOVAL OF STONES;  Surgeon: Willis Modena, MD;  Location: [facility] ENDOSCOPY;  Service: Endoscopy;;   SPHINCTEROTOMY  04/22/2018   Procedure: Dennison Mascot;  Surgeon: Willis Modena, MD;  Location: [facility] ENDOSCOPY;  Service: Endoscopy;;   TRANSTHORACIC ECHOCARDIOGRAM  2013   EF 52%, mild AV leaflet thickening; trace TR    Allergies: Quinine  Medications: Prior to Admission medications   Medication Sig Start Date End Date Taking? Authorizing Provider  amLODipine (NORVASC) 2.5 MG tablet Take 1 tablet (2.5 mg total) by mouth daily. 02/21/16  Yes Hilty, Lisette Abu, MD  Ascorbic Acid (VITAMIN C) 1000 MG tablet Take 1,000 mg by mouth daily.   Yes [provider]  doxazosin (CARDURA) 4 MG tablet Take 1 tablet (4 mg total) by mouth daily. KEEP OV. 01/16/17  Yes Hilty, Lisette Abu, MD  ELIQUIS 5 MG TABS tablet TAKE 1 TABLET BY MOUTH TWICE A DAY 04/26/18  Yes Hilty, Lisette Abu, MD  losartan-hydrochlorothiazide (HYZAAR) 100-25 MG tablet Take 1 tablet by mouth daily. 01/18/18  Yes Hilty, Lisette Abu, MD  metoprolol succinate (TOPROL-XL) 25 MG 24 hr tablet TAKE 1 TABLET (25 MG TOTAL) BY MOUTH DAILY. 04/13/17  Yes Hilty, Lisette Abu, MD  Multiple Vitamins-Minerals (PRESERVISION AREDS 2) CAPS Take 1 capsule by mouth daily.   Yes [provider]  ondansetron (ZOFRAN) 4 MG tablet Take 1 tablet (4 mg total) by mouth every 6 (six) hours as needed for nausea. 04/23/18  Yes Glade Lloyd, MD  traMADol (ULTRAM) 50 MG tablet Take  1 tablet (50 mg total) by mouth every 6 (six) hours as needed for severe pain. 04/23/18 04/23/19 Yes Glade Lloyd, MD     Family History  Problem Relation Age of Onset   Heart disease Neg Hx     Social History   Socioeconomic History   Marital status: Married    Spouse name: Not on file   Number of children: 4   Years of education: Not on file   Highest education level: Not on file  Occupational History   Not on file  Social Needs   Financial resource strain: Not on file   Food insecurity:    Worry: Not on file    Inability: Not on file   Transportation needs:    Medical: Not on file    Non-medical: Not on file  Tobacco Use   Smoking status: Former Smoker    Packs/day: 2.00    Years: 11.00    Pack years: 22.00    Types: Cigarettes    Last attempt to quit: 02/10/1957    Years since quitting: 61.2   Smokeless tobacco: Never Used  Substance and Sexual Activity   Alcohol use: No   Drug use: No   Sexual activity: Not on file  Lifestyle   Physical activity:    Days per week: Not on file    Minutes per session: Not on file   Stress: Not on file  Relationships   Social connections:    Talks on phone: Not on file    Gets together: Not on file    Attends religious service: Not on file    Active member of club or organization: Not on file    Attends meetings of clubs or organizations: Not on file    Relationship status: Not on file  Other Topics Concern   Not on file  Social History Narrative   [place] [employer] veteran - [employer]    Review of Systems: A 12 point ROS discussed and pertinent positives are indicated in the HPI above.  All other systems are negative.  Review of Systems  Constitutional: Positive for activity change, appetite change, fatigue and fever.  Respiratory: Positive for shortness of breath.   Gastrointestinal: Positive for abdominal pain.  Neurological: Positive for weakness.  Psychiatric/Behavioral: Positive for confusion and decreased  concentration.    Vital Signs: BP 117/60    Pulse (!) 107    Temp 99.3 F (37.4 C) (Oral)    Resp 20    Ht  (1.626 m)    Wt 175 lb 4.3 oz (79.5 kg)    SpO2 97%    BMI 30.08 kg/m   Physical Exam Vitals signs reviewed.  Constitutional:      Appearance: He is ill-appearing.  Cardiovascular:     Rate and Rhythm: Normal rate and regular rhythm.  Pulmonary:     Breath sounds: Wheezing present.  Abdominal:     Tenderness: There is abdominal tenderness.  Skin:    General: Skin is dry.  Neurological:     Mental Status: Mental status is at baseline.  Psychiatric:     Comments: Consented with Dtr Katrina via phone  Imaging: Ct Abdomen Pelvis Wo Contrast  Result Date: 04/26/2018 CLINICAL DATA:  Gallbladder removed Friday. Nonproductive cough. Elevated temperature. Abdominal pain. EXAM: CT ABDOMEN AND PELVIS WITHOUT CONTRAST TECHNIQUE: Multidetector CT imaging of the abdomen and pelvis was performed following the standard protocol without IV contrast. COMPARISON:  CT abdomen 04/15/2018 FINDINGS: Lower chest: Bilateral small pleural effusions. Left lower lobe airspace disease likely reflecting atelectasis. Hepatobiliary: Normal liver. No intrahepatic biliary ductal dilatation. No hepatic mass. Interval cholecystectomy. 7.6 x 10 x 17 cm fluid collection emanating from the gallbladder fossa and tracking posteriorly with a few bubbles of air within the collection which may reflect a biloma or abscess. Mild inflammatory changes along the inferior margin of the liver. Small amount of perihepatic fluid. Pancreas: Unremarkable. No pancreatic ductal dilatation or surrounding inflammatory changes. Spleen: Normal in size without focal abnormality. Adrenals/Urinary Tract: Adrenal glands are unremarkable. Kidneys are normal, without renal calculi, focal lesion, or hydronephrosis. Bladder is unremarkable. Stomach/Bowel: Stomach is within normal limits. Appendix appears normal. No evidence of bowel wall  thickening, distention, or inflammatory changes. Diverticulosis without evidence of diverticulitis. Vascular/Lymphatic: Normal caliber abdominal aorta with atherosclerosis. No lymphadenopathy. Reproductive: Prostate is unremarkable. Other: No fluid collection or hematoma. Small amount of pelvic free fluid. Musculoskeletal: No acute osseous abnormality. No aggressive osseous lesion. Chronic compression fracture of L1 vertebral body. Diffuse thoracolumbar spondylosis. IMPRESSION: 1. Interval cholecystectomy. 7.6 x 10 x 17 cm fluid collection emanating from the gallbladder fossa and tracking posteriorly with a few bubbles of air within the collection which may reflect a biloma versus abscess. Electronically Signed   By: Elige Ko   On: 04/26/2018 17:29   Dg Chest 2 View  Result Date: 04/26/2018 CLINICAL DATA:  85 year old male with cough.  Initial encounter. EXAM: CHEST - 2 VIEW COMPARISON:  12/30/2017 CT.  12/23/2017 chest x-ray. FINDINGS: Cardiomegaly.  Pulmonary vascular congestion. Left lower lobe consolidation with pleural effusion. Left lower lobe mass as noted on prior CT of indeterminate etiology. Right lower lobe nodule and scattered granulomas better delineated on prior CT. Calcified aorta. No obvious acute osseous abnormality although evaluation limited. Prior compression fracture L1. IMPRESSION: 1. Left lower lobe consolidation with pleural effusion. 2. Left lower lobe mass as noted on prior CT of indeterminate etiology. 3. Cardiomegaly.  Pulmonary vascular congestion. 4. Right lower lobe nodule and scattered granulomas better delineated on prior CT. 5.  Aortic Atherosclerosis (ICD10-I70.0). Electronically Signed   By: Lacy Duverney M.D.   On: 04/26/2018 18:11   Dg Abd 1 View  Result Date: 04/19/2018 CLINICAL DATA:  85 year old male with anterior abdominal pain. Status post cholecystectomy postoperative day 3. EXAM: ABDOMEN - 1 VIEW COMPARISON:  Radiographs 04/18/2018 and earlier. FINDINGS: Supine  views. Stable cholecystectomy clips. Oral contrast which was in the right colon yesterday is now in the descending and visible sigmoid colon. Bowel gas pattern is within normal limits. No definite pneumoperitoneum on these supine views. Streaky left lung base opacity is stable. The right lung base appears clear. No acute osseous abnormality identified. IMPRESSION: 1. Normal bowel gas pattern with transit of oral contrast since yesterday into the descending and sigmoid colon. 2. Continued streaky left lung base opacity. Electronically Signed   By: Odessa Fleming M.D.   On: 04/19/2018 16:25   Dg Cholangiogram Operative  Result Date: 04/16/2018 CLINICAL DATA:  Intraoperative cholangiogram during laparoscopic cholecystectomy. EXAM: INTRAOPERATIVE CHOLANGIOGRAM FLUOROSCOPY TIME:  20 seconds COMPARISON:  MRCP-04/15/2018 FINDINGS: Intraoperative cholangiographic images of the right upper abdominal quadrant during laparoscopic cholecystectomy are  provided for review. Surgical clips overlie the expected location of the gallbladder fossa. Contrast injection demonstrates selective cannulation of the central aspect of the cystic duct. There is passage of contrast through the central aspect of the cystic duct with filling of a mildly dilated common bile duct. There is passage of contrast though the CBD and into the descending portion of the duodenum. There is minimal reflux of injected contrast into the common hepatic duct and central aspect of the non dilated intrahepatic biliary system. There is a lenticular shaped nonocclusive filling defect within the distal aspect of the CBD which could be indicative of choledocholithiasis questioned on preceding MRCP. IMPRESSION: Lenticular shaped nonocclusive filling defect within the distal aspect of the CBD, potentially artifactual due to underdistention though could be indicative of nonocclusive choledocholithiasis questioned on preceding MRCP. Further evaluation and management with ERCP  could be performed as indicated. Electronically Signed   By: Simonne Come M.D.   On: 04/16/2018 11:51   Ct Abdomen Pelvis W Contrast  Addendum Date: 04/15/2018   ADDENDUM REPORT: 04/15/2018 03:42 ADDENDUM: Additional clinical information, jaundice. The patient's CT images are reviewed. Extrahepatic bile duct is slightly enlarged, measuring up to 9 mm. Questionable density within the distal duct at the head of pancreas. Recommend correlation with LFTs. MRCP could be obtained if concern for ductal obstruction is given history of jaundice Electronically Signed   By: [name] M.D.   On: 04/15/2018 03:42   Result Date: 04/15/2018 CLINICAL DATA:  Abdominal pain with nausea and vomiting EXAM: CT ABDOMEN AND PELVIS WITH CONTRAST TECHNIQUE: Multidetector CT imaging of the abdomen and pelvis was performed using the standard protocol following bolus administration of intravenous contrast. CONTRAST:  78mL OMNIPAQUE IOHEXOL 300 MG/ML  SOLN COMPARISON:  PET CT 01/08/2018, CT 12/30/2017 FINDINGS: Lower chest: Lung bases again demonstrate oval subpleural mass in the left lower lobe, this measures 4.1 x 2.2 cm and appears slightly increased in size. No significant pleural effusion. Small hiatal hernia. Hepatobiliary: Cyst at the dome of the liver. Calcified stone at the neck of the gallbladder. Suspected edema or fluid adjacent to the gallbladder fundus. No biliary dilatation Pancreas: Unremarkable. No pancreatic ductal dilatation or surrounding inflammatory changes. Spleen: Normal in size without focal abnormality. Adrenals/Urinary Tract: Adrenal glands are unremarkable. Kidneys are normal, without renal calculi, focal lesion, or hydronephrosis. Bladder is unremarkable. Stomach/Bowel: The stomach is nonenlarged. No dilated small bowel. No colon wall thickening. Negative appendix. Colon diverticular disease without acute inflammatory change. Vascular/Lymphatic: Moderate aortic atherosclerosis. No aneurysm. No significantly  enlarged lymph nodes. Reproductive: Enlarged heterogeneous prostate with calcification Other: Negative for free air or free fluid Musculoskeletal: Multiple level degenerative changes of the spine. Chronic compression deformity L1. IMPRESSION: 1. 2.3 cm calcified stone at the gallbladder neck. Suspected gallbladder wall edema versus small amount of pericholecystic fluid. Suggest correlation with ultrasound. 2. Slight interval increase in size of subpleural left lower lobe lung mass now measuring 4.1 cm. 3. Diverticular disease of the colon without acute inflammatory change Electronically Signed: By: [name] M.D. On: 04/15/2018 02:38   Mr Abdomen Mrcp Wo Contrast  Result Date: 04/16/2018 CLINICAL DATA:  85 year old male with history of abdominal pain, concerning for an acute cholecystitis. Additional history of paroxysmal atrial fibrillation and atrial flutter on Eliquis. Hypertension. COPD. EXAM: MRI ABDOMEN WITHOUT CONTRAST  (INCLUDING MRCP) TECHNIQUE: Multiplanar multisequence MR imaging of the abdomen was performed. Heavily T2-weighted images of the biliary and pancreatic ducts were obtained, and three-dimensional MRCP images were rendered by  post processing. COMPARISON:  No priors. FINDINGS: Comment: Today's study is limited for detection and characterization of visceral and/or vascular lesions by lack of IV gadolinium. Lower chest: Unremarkable. Hepatobiliary: Heterogeneous loss of signal intensity throughout the hepatic parenchyma on out of phase dual echo images, indicative of hepatic steatosis. Several small T1 hypointense, T2 hyperintense lesions are noted in the liver, incompletely characterized on today's noncontrast examination, but presumably small cysts and/or biliary hamartomas, largest of which has some thin internal septations and measures up to 12 mm in segment 4A. MRCP images demonstrate very mild intrahepatic biliary ductal dilatation. Common bile duct is also mildly dilated measuring 8  mm in the porta hepatis. In the distal common bile duct there is a 4 mm filling defect (coronal image 25 of series 12), compatible with choledocholithiasis. There may be another filling defect immediately before the ampulla, however, this is difficult to judge secondary to motion. In addition there is a large signal void in the neck of the gallbladder measuring 2.3 x 1.5 cm, compatible with a gallstone. Some amorphous material lies dependently in the gallbladder and is intermediate intensity on T1 weighted images and slightly low signal intensity on T2 weighted images, compatible with biliary sludge. Gallbladder wall is diffusely thickened and edematous. There is an abnormal appearance of the anteromedial aspect of the wall which is irregular in shape, which may simply reflect a fold, however, the possibility of pending gallbladder perforation in this region is not excluded (best appreciated on axial image 23 of series 10 and 23 of series 7). Increased T2 signal intensity surrounding the gallbladder, indicative of acute inflammation. Pancreas: No pancreatic mass. No pancreatic ductal dilatation. No pancreatic or peripancreatic fluid or inflammatory changes. Spleen:  Unremarkable. Adrenals/Urinary Tract: Small amount of T2 signal intensity surrounding both kidneys, nonspecific. Unenhanced appearance of the kidneys and bilateral adrenal glands is otherwise unremarkable. Stomach/Bowel: Visualized portions are unremarkable. Vascular/Lymphatic: Aortic atherosclerosis. No aneurysm noted in the abdominal vasculature. Other: Trace amount of pericholecystic fluid. No significant volume of ascites noted in the visualized portions of the peritoneal cavity. Musculoskeletal: No aggressive appearing osseous lesions are noted in the visualized portions of the skeleton. IMPRESSION: 1. Cholelithiasis with evidence of acute cholecystitis. Irregularity of the anteromedial wall of the gallbladder may simply reflect a gallbladder fold,  however, the possibility of pending gallbladder perforation is suspected and surgical evaluation is strongly recommended. 2. Choledocholithiasis. Mild intra and extrahepatic biliary ductal dilatation. 3. Heterogeneous hepatic steatosis. 4. Aortic atherosclerosis. These results will be called to the ordering clinician or representative by the Radiologist Assistant, and communication documented in the PACS or zVision Dashboard. Electronically Signed   By: Trudie Reed M.D.   On: 04/16/2018 08:44   Mr 3d Recon At Scanner  Result Date: 04/16/2018 CLINICAL DATA:  85 year old male with history of abdominal pain, concerning for an acute cholecystitis. Additional history of paroxysmal atrial fibrillation and atrial flutter on Eliquis. Hypertension. COPD. EXAM: MRI ABDOMEN WITHOUT CONTRAST  (INCLUDING MRCP) TECHNIQUE: Multiplanar multisequence MR imaging of the abdomen was performed. Heavily T2-weighted images of the biliary and pancreatic ducts were obtained, and three-dimensional MRCP images were rendered by post processing. COMPARISON:  No priors. FINDINGS: Comment: Today's study is limited for detection and characterization of visceral and/or vascular lesions by lack of IV gadolinium. Lower chest: Unremarkable. Hepatobiliary: Heterogeneous loss of signal intensity throughout the hepatic parenchyma on out of phase dual echo images, indicative of hepatic steatosis. Several small T1 hypointense, T2 hyperintense lesions are noted in the liver, incompletely  characterized on today's noncontrast examination, but presumably small cysts and/or biliary hamartomas, largest of which has some thin internal septations and measures up to 12 mm in segment 4A. MRCP images demonstrate very mild intrahepatic biliary ductal dilatation. Common bile duct is also mildly dilated measuring 8 mm in the porta hepatis. In the distal common bile duct there is a 4 mm filling defect (coronal image 25 of series 12), compatible with  choledocholithiasis. There may be another filling defect immediately before the ampulla, however, this is difficult to judge secondary to motion. In addition there is a large signal void in the neck of the gallbladder measuring 2.3 x 1.5 cm, compatible with a gallstone. Some amorphous material lies dependently in the gallbladder and is intermediate intensity on T1 weighted images and slightly low signal intensity on T2 weighted images, compatible with biliary sludge. Gallbladder wall is diffusely thickened and edematous. There is an abnormal appearance of the anteromedial aspect of the wall which is irregular in shape, which may simply reflect a fold, however, the possibility of pending gallbladder perforation in this region is not excluded (best appreciated on axial image 23 of series 10 and 23 of series 7). Increased T2 signal intensity surrounding the gallbladder, indicative of acute inflammation. Pancreas: No pancreatic mass. No pancreatic ductal dilatation. No pancreatic or peripancreatic fluid or inflammatory changes. Spleen:  Unremarkable. Adrenals/Urinary Tract: Small amount of T2 signal intensity surrounding both kidneys, nonspecific. Unenhanced appearance of the kidneys and bilateral adrenal glands is otherwise unremarkable. Stomach/Bowel: Visualized portions are unremarkable. Vascular/Lymphatic: Aortic atherosclerosis. No aneurysm noted in the abdominal vasculature. Other: Trace amount of pericholecystic fluid. No significant volume of ascites noted in the visualized portions of the peritoneal cavity. Musculoskeletal: No aggressive appearing osseous lesions are noted in the visualized portions of the skeleton. IMPRESSION: 1. Cholelithiasis with evidence of acute cholecystitis. Irregularity of the anteromedial wall of the gallbladder may simply reflect a gallbladder fold, however, the possibility of pending gallbladder perforation is suspected and surgical evaluation is strongly recommended. 2.  Choledocholithiasis. Mild intra and extrahepatic biliary ductal dilatation. 3. Heterogeneous hepatic steatosis. 4. Aortic atherosclerosis. These results will be called to the ordering clinician or representative by the Radiologist Assistant, and communication documented in the PACS or zVision Dashboard. Electronically Signed   By: Trudie Reed M.D.   On: 04/16/2018 08:44   US Abdomen Limited  Result Date: 04/15/2018 CLINICAL DATA:  Jaundice EXAM: ULTRASOUND ABDOMEN LIMITED RIGHT UPPER QUADRANT COMPARISON:  CT 04/15/2018 FINDINGS: Gallbladder: Shadowing stone within the gallbladder neck measuring 1.9 cm. Increased wall thickness of 8.8 mm with trace pericholecystic fluid. Negative sonographic Murphy. Small amount of intraluminal sludge. Common bile duct: Diameter: 4.9 mm Liver: Heterogeneous echotexture without focal hepatic abnormality. Portal vein is patent on color Doppler imaging with normal direction of blood flow towards the liver. IMPRESSION: 1. Gallstone with increased wall thickness and small amount of pericholecystic fluid, raising concern for cholecystitis despite negative sonographic Murphy. Electronically Signed   By: [name] M.D.   On: 04/15/2018 03:39   Dg Ercp Biliary & Pancreatic Ducts  Result Date: 04/22/2018 CLINICAL DATA:  Choledocholithiasis EXAM: ERCP with sphincterotomy and balloon sweep for stone removal TECHNIQUE: Multiple spot images obtained with the fluoroscopic device and submitted for interpretation post-procedure. FLUOROSCOPY TIME:  Fluoroscopy Time:  2 minutes 34 seconds Radiation Exposure Index (if provided by the fluoroscopic device): Not available Number of Acquired Spot Images: 4 COMPARISON:  04/15/2018 FINDINGS: Retrograde cholangiogram performed during the ERCP. Previous cholecystectomy noted. Mild biliary dilatation.  Balloon sweep performed for stone removal. No occlusive filling defect. No focal narrowing or stricture. IMPRESSION: Limited cholangiogram during  sphincterotomy and balloon sweep for CBD stone removal as above. These images were submitted for radiologic interpretation only. Please see the procedural report for the amount of contrast and the fluoroscopy time utilized. Electronically Signed   By: Judie Petit.  Shick M.D.   On: 04/22/2018 10:30   Dg Abd Portable 1v  Result Date: 04/18/2018 CLINICAL DATA:  Abdominal pain for several days EXAM: PORTABLE ABDOMEN - 1 VIEW COMPARISON:  04/17/2018 FINDINGS: Scattered large and small bowel gas is noted. Previously seen contrast is again noted within the colon. Mildly prominent loops of small bowel are again noted and stable from the previous exam. No free air is noted. No other focal abnormality is seen. IMPRESSION: Bowel-gas pattern stable from the prior exam consistent with small bowel ileus. Electronically Signed   By: Alcide Clever M.D.   On: 04/18/2018 10:23   Dg Abd Portable 1v  Result Date: 04/17/2018 CLINICAL DATA:  Ileus. EXAM: PORTABLE ABDOMEN - 1 VIEW COMPARISON:  04/15/2018 FINDINGS: Two supine views of the abdomen and pelvis. Gas-filled small bowel loops of up to 2.6 cm. Gas and contrast within normal caliber colon. No gross free intraperitoneal air. IMPRESSION: Upper normal caliber gas-filled loops of small bowel, favoring mild adynamic ileus. Electronically Signed   By: Jeronimo Greaves M.D.   On: 04/17/2018 13:07    Labs:  CBC: Recent Labs    04/22/18 0336 04/23/18 0420 04/26/18 1508 04/27/18 0336  WBC 11.1* 12.2* 19.9* 13.4*  HGB 10.8* 11.4* 11.2* 10.3*  HCT 31.7* 33.1* 33.3* 30.9*  PLT 254 304 408* 366    COAGS: Recent Labs    04/26/18 1508 04/27/18 0336  INR 2.2* 2.3*    BMP: Recent Labs    04/22/18 0336 04/23/18 0420 04/26/18 1508 04/27/18 0336  NA 138 137 135 137  K 3.9 3.8 2.9* 3.5  CL 107 106 99 104  CO2 23 22 24  20*  GLUCOSE 101* 112* 118* 104*  BUN 13 17 21 21   CALCIUM 8.2* 8.4* 8.2* 7.6*  CREATININE 1.41* 1.43* 1.42* 1.84*  GFRNONAA 44* 43* 43* 32*  GFRAA 50*  50* 50* 37*    LIVER FUNCTION TESTS: Recent Labs    04/22/18 0336 04/23/18 0420 04/26/18 1508 04/27/18 0336  BILITOT 1.3* 1.4* 1.5* 2.1*  AST 31 30 53* 50*  ALT 60* 54* 58* 45*  ALKPHOS 152* 147* 159* 120  PROT 5.8* 5.7* 6.8 5.4*  ALBUMIN 2.3* 2.3* 2.8* 1.9*    TUMOR MARKERS: No results for input(s): AFPTM, CEA, CA199, CHROMGRNA in the last 8760 hours.  Assessment and Plan:  Intra abdominal abscess Post cholecystectomy 3/6 and ERCP 3/12 Scheduled now for abdominal abscess drain placement 3/18 in IR INR 2.3 today-- discussed with Dr Hanley Ben-- asked if he felt pt could wait for Vit K dose and more normalization of INR He is dosing Vit K today-- will recheck inr in am Plan for drain placement 3/18 in IR Risks and benefits discussed with the patient's daughter Katrina via phone including bleeding, infection, damage to adjacent structures, bowel perforation/fistula connection, and sepsis.  All of her questions were answered, she is agreeable to proceed. Consent signed and in chart.  Thank you for this interesting consult.  I greatly enjoyed meeting Vincent Barrett and look forward to participating in their care.  A copy of this report was sent to the requesting provider on this date.  Electronically  Signed: Robet Leu, PA-C 04/27/2018, 9:26 AM   I spent a total of 40 Minutes    in face to face in clinical consultation, greater than 50% of which was counseling/coordinating care for intra abdominal abscess drain placement

## 2018-04-27 NOTE — Progress Notes (Signed)
PHARMACY NOTE:  ANTIMICROBIAL RENAL DOSAGE ADJUSTMENT  Current antimicrobial regimen includes a mismatch between antimicrobial dosage and estimated renal function.  As per policy approved by the Pharmacy & Therapeutics and Medical Executive Committees, the antimicrobial dosage will be adjusted accordingly.  Current antimicrobial dosage:  Cefepime 2 g IV q24h  Indication: Intra-abdominal abscess  Renal Function:  Estimated Creatinine Clearance: 25.4 mL/min (A) (by C-G formula based on SCr of 1.84 mg/dL (H)). []      On intermittent HD, scheduled: []      On CRRT    Antimicrobial dosage has been changed to:  Cefepime 1 g IV q24h   Thank you for allowing pharmacy to be a part of this patient's care.  Arvilla Market, PharmD PGY1 Pharmacy Resident Phone [phone] 04/27/2018     2:13 PM

## 2018-04-27 NOTE — Plan of Care (Signed)
  Problem: Education: Goal: Knowledge of General Education information will improve Description Including pain rating scale, medication(s)/side effects and non-pharmacologic comfort measures Outcome: Progressing   Problem: Health Behavior/Discharge Planning: Goal: Ability to manage health-related needs will improve Outcome: Progressing   Problem: Clinical Measurements: Goal: Will remain free from infection Outcome: Progressing Goal: Respiratory complications will improve Outcome: Progressing   Problem: Elimination: Goal: Will not experience complications related to bowel motility Outcome: Progressing   Problem: Pain Managment: Goal: General experience of comfort will improve Outcome: Progressing   Problem: Safety: Goal: Ability to remain free from injury will improve Outcome: Progressing   Problem: Activity: Goal: Risk for activity intolerance will decrease Outcome: Not Progressing   Problem: Nutrition: Goal: Adequate nutrition will be maintained Outcome: Not Progressing

## 2018-04-27 NOTE — Progress Notes (Signed)
Dr. Hanley Ben notified pt has starting to have hemoptysis no new orders at this time will continue to monitor.

## 2018-04-27 NOTE — Progress Notes (Signed)
Pt with increased lethargy and hypotension BP 83/50, with increased hemoptysis , Dr. Hanley Ben notified. Pt given 1 L bolus,blood gas , and chest xray ordere.  Repeat  BP after bolus 107/57. Order given to transfer pt to ICU Report called to Nurse on 60M and pt transferred to 60M 05.

## 2018-04-27 NOTE — Progress Notes (Signed)
Subjective/Chief Complaint: Nauseated   Objective: Vital signs in last 24 hours: Temp:  [99.1 F (37.3 C)-103.5 F (39.7 C)] 99.3 F (37.4 C) (03/17 0406) Pulse Rate:  [103-116] 107 (03/17 0509) Resp:  [18-40] 20 (03/17 0406) BP: (99-166)/(53-118) 117/60 (03/17 0509) SpO2:  [90 %-99 %] 97 % (03/17 0406) Weight:  [79.5 kg] 79.5 kg (03/16 2356) Last BM Date: 04/26/18  Intake/Output from previous day: 03/16 0701 - 03/17 0700 In: 1866 [I.V.:321; IV Piggyback:1545] Out: 400 [Urine:400] Intake/Output this shift: No intake/output data recorded.  General appearance: sleeping comfortably, awakens easily Resp: slightly tachypneic, sats 95% Cardio: regular rate and rhythm and tachycardic 105 GI: soft, nondistended, mild ruq ttp Skin: Skin color, texture, turgor normal. No rashes or lesions  Lab Results:  Recent Labs    04/26/18 1508 04/27/18 0336  WBC 19.9* 13.4*  HGB 11.2* 10.3*  HCT 33.3* 30.9*  PLT 408* 366   BMET Recent Labs    04/26/18 1508 04/27/18 0336  NA 135 137  K 2.9* 3.5  CL 99 104  CO2 24 20*  GLUCOSE 118* 104*  BUN 21 21  CREATININE 1.42* 1.84*  CALCIUM 8.2* 7.6*   PT/INR Recent Labs    04/26/18 1508 04/27/18 0336  LABPROT 23.7* 25.1*  INR 2.2* 2.3*   ABG No results for input(s): PHART, HCO3 in the last 72 hours.  Invalid input(s): PCO2, PO2  Studies/Results: Ct Abdomen Pelvis Wo Contrast  Result Date: 04/26/2018 CLINICAL DATA:  Gallbladder removed Friday. Nonproductive cough. Elevated temperature. Abdominal pain. EXAM: CT ABDOMEN AND PELVIS WITHOUT CONTRAST TECHNIQUE: Multidetector CT imaging of the abdomen and pelvis was performed following the standard protocol without IV contrast. COMPARISON:  CT abdomen 04/15/2018 FINDINGS: Lower chest: Bilateral small pleural effusions. Left lower lobe airspace disease likely reflecting atelectasis. Hepatobiliary: Normal liver. No intrahepatic biliary ductal dilatation. No hepatic mass. Interval  cholecystectomy. 7.6 x 10 x 17 cm fluid collection emanating from the gallbladder fossa and tracking posteriorly with a few bubbles of air within the collection which may reflect a biloma or abscess. Mild inflammatory changes along the inferior margin of the liver. Small amount of perihepatic fluid. Pancreas: Unremarkable. No pancreatic ductal dilatation or surrounding inflammatory changes. Spleen: Normal in size without focal abnormality. Adrenals/Urinary Tract: Adrenal glands are unremarkable. Kidneys are normal, without renal calculi, focal lesion, or hydronephrosis. Bladder is unremarkable. Stomach/Bowel: Stomach is within normal limits. Appendix appears normal. No evidence of bowel wall thickening, distention, or inflammatory changes. Diverticulosis without evidence of diverticulitis. Vascular/Lymphatic: Normal caliber abdominal aorta with atherosclerosis. No lymphadenopathy. Reproductive: Prostate is unremarkable. Other: No fluid collection or hematoma. Small amount of pelvic free fluid. Musculoskeletal: No acute osseous abnormality. No aggressive osseous lesion. Chronic compression fracture of L1 vertebral body. Diffuse thoracolumbar spondylosis. IMPRESSION: 1. Interval cholecystectomy. 7.6 x 10 x 17 cm fluid collection emanating from the gallbladder fossa and tracking posteriorly with a few bubbles of air within the collection which may reflect a biloma versus abscess. Electronically Signed   By: Elige Ko   On: 04/26/2018 17:29   Dg Chest 2 View  Result Date: 04/26/2018 CLINICAL DATA:  85 year old male with cough.  Initial encounter. EXAM: CHEST - 2 VIEW COMPARISON:  12/30/2017 CT.  12/23/2017 chest x-ray. FINDINGS: Cardiomegaly.  Pulmonary vascular congestion. Left lower lobe consolidation with pleural effusion. Left lower lobe mass as noted on prior CT of indeterminate etiology. Right lower lobe nodule and scattered granulomas better delineated on prior CT. Calcified aorta. No obvious acute osseous  abnormality  although evaluation limited. Prior compression fracture L1. IMPRESSION: 1. Left lower lobe consolidation with pleural effusion. 2. Left lower lobe mass as noted on prior CT of indeterminate etiology. 3. Cardiomegaly.  Pulmonary vascular congestion. 4. Right lower lobe nodule and scattered granulomas better delineated on prior CT. 5.  Aortic Atherosclerosis (ICD10-I70.0). Electronically Signed   By: Lacy Duverney M.D.   On: 04/26/2018 18:11    Anti-infectives: Anti-infectives (From admission, onward)   Start     Dose/Rate Route Frequency Ordered Stop   04/27/18 1800  vancomycin (VANCOCIN) IVPB 750 mg/150 ml premix  Status:  Discontinued     750 mg 150 mL/hr over 60 Minutes Intravenous Every 24 hours 04/26/18 1634 04/27/18 0730   04/27/18 1600  ceFEPIme (MAXIPIME) 2 g in sodium chloride 0.9 % 100 mL IVPB     2 g 200 mL/hr over 30 Minutes Intravenous Every 24 hours 04/26/18 1629     04/26/18 2359  metroNIDAZOLE (FLAGYL) IVPB 500 mg     500 mg 100 mL/hr over 60 Minutes Intravenous Every 8 hours 04/26/18 2325     04/26/18 1700  vancomycin (VANCOCIN) 1,500 mg in sodium chloride 0.9 % 500 mL IVPB     1,500 mg 250 mL/hr over 120 Minutes Intravenous  Once 04/26/18 1557 04/26/18 2059   04/26/18 1600  ceFEPIme (MAXIPIME) 2 g in sodium chloride 0.9 % 100 mL IVPB     2 g 200 mL/hr over 30 Minutes Intravenous  Once 04/26/18 1553 04/26/18 1650   04/26/18 1600  metroNIDAZOLE (FLAGYL) IVPB 500 mg     500 mg 100 mL/hr over 60 Minutes Intravenous  Once 04/26/18 1553 04/26/18 1830   04/26/18 1600  vancomycin (VANCOCIN) IVPB 1000 mg/200 mL premix  Status:  Discontinued     1,000 mg 200 mL/hr over 60 Minutes Intravenous  Once 04/26/18 1553 04/26/18 1557      Assessment/Plan: Status post laparoscopic cholecystectomy 04/16/2018 by Dr. Corliss Skains Gallbladder fossa abscess - IV ABX, will ask interventional radiology to drain this area.  May need to correct INR for this to happen.  If the fluid is very  bilious, may need HIDA scan to rule out bile leak.  We will follow with you.    PNA, Acute kidney injury, elevated troponin - per primary  LOS: 1 day    Berna Bue 04/27/2018

## 2018-04-27 NOTE — Progress Notes (Addendum)
Patient reassessed this afternoon- worsening shortness of breath/ tachypnea and abdominal pain.  Has decompensated requiring intubation and pressors. His abdomen is mildly distended, very soft. He does grimace to palpation in the right midabdomen but seems relatively nontender elsewhere, though his exam is obviously clouded by fentanyl gtt. His tenderness corresponds to the large fluid collection noted on CT. Incisions are all healing without signs of infection or hernia. OG in place with minimal output.  Plain films of the abdomen are unremarkable. CXR shows right mainstem ETT and worsening left basilar atelectasis. The ett has been addressed.  ABG with mild base deficit of 3. INR down to 1.7.  I recommend proceeding with percutaneous drain placement for the gallbladder fossa abscess as soon as feasible. Surgical exploration is not indicated at this point.   I discussed with Dr. Vassie Loll by phone and with family at the bedside (wife) as well as bedside nurse. Dr. Deanne Coffer is in a procedure presently but I have left a message regarding hopefully getting this done tonight.

## 2018-04-27 NOTE — Procedures (Signed)
Intubation Procedure Note Vincent Barrett [id number] 20-Jun-1925  Procedure: Intubation Indications: Respiratory insufficiency  Procedure Details Consent: Risks of procedure as well as the alternatives and risks of each were explained to the (patient/caregiver).  Consent for procedure obtained. Time Out: Verified patient identification, verified procedure, site/side was marked, verified correct patient position, special equipment/implants available, medications/allergies/relevent history reviewed, required imaging and test results available.  Performed  Maximum sterile technique was used including gloves, gown, hand hygiene and mask.  MAC and 4  Versed 1  fent 100 mcg Etomidate 20  Brown liquid pooled in pharynx & staining tongue, did have some emesis which was suctioned out. ETT passed under vision.  Evaluation Hemodynamic Status: Transient hypotension treated with pressors and fluid; O2 sats: transiently fell during during procedure Patient's Current Condition: stable Complications: No apparent complications Patient did tolerate procedure well. Chest X-ray ordered to verify placement.  CXR: pending.   Leanna Sato Elsworth Soho 04/27/2018

## 2018-04-28 ENCOUNTER — Telehealth: Payer: Self-pay

## 2018-04-28 ENCOUNTER — Inpatient Hospital Stay (HOSPITAL_COMMUNITY): Payer: Medicare Other

## 2018-04-28 DIAGNOSIS — J9601 Acute respiratory failure with hypoxia: Secondary | ICD-10-CM

## 2018-04-28 DIAGNOSIS — L8990 Pressure ulcer of unspecified site, unspecified stage: Secondary | ICD-10-CM

## 2018-04-28 LAB — BPAM FFP
BLOOD PRODUCT EXPIRATION DATE: 202003192359
Blood Product Expiration Date: 202003182359
Blood Product Expiration Date: 202003192359
Blood Product Expiration Date: 202003192359
Blood Product Expiration Date: 202003192359
ISSUE DATE / TIME: 202003171432
ISSUE DATE / TIME: 202003171508
ISSUE DATE / TIME: 202003171536
ISSUE DATE / TIME: 202003171605
Unit Type and Rh: 600
Unit Type and Rh: 6200
Unit Type and Rh: 6200
Unit Type and Rh: 6200
Unit Type and Rh: 6200

## 2018-04-28 LAB — PREPARE FRESH FROZEN PLASMA
Unit division: 0
Unit division: 0
Unit division: 0
Unit division: 0
Unit division: 0

## 2018-04-28 LAB — GLUCOSE, CAPILLARY
GLUCOSE-CAPILLARY: 90 mg/dL (ref 70–99)
GLUCOSE-CAPILLARY: 91 mg/dL (ref 70–99)
Glucose-Capillary: 120 mg/dL — ABNORMAL HIGH (ref 70–99)
Glucose-Capillary: 78 mg/dL (ref 70–99)
Glucose-Capillary: 80 mg/dL (ref 70–99)
Glucose-Capillary: 86 mg/dL (ref 70–99)
Glucose-Capillary: 86 mg/dL (ref 70–99)

## 2018-04-28 LAB — STREP PNEUMONIAE URINARY ANTIGEN: Strep Pneumo Urinary Antigen: NEGATIVE

## 2018-04-28 LAB — COMPREHENSIVE METABOLIC PANEL
ALT: 36 U/L (ref 0–44)
ANION GAP: 10 (ref 5–15)
AST: 44 U/L — ABNORMAL HIGH (ref 15–41)
Albumin: 2 g/dL — ABNORMAL LOW (ref 3.5–5.0)
Alkaline Phosphatase: 139 U/L — ABNORMAL HIGH (ref 38–126)
BUN: 39 mg/dL — ABNORMAL HIGH (ref 8–23)
CO2: 22 mmol/L (ref 22–32)
Calcium: 7.4 mg/dL — ABNORMAL LOW (ref 8.9–10.3)
Chloride: 110 mmol/L (ref 98–111)
Creatinine, Ser: 2.82 mg/dL — ABNORMAL HIGH (ref 0.61–1.24)
GFR calc Af Amer: 22 mL/min — ABNORMAL LOW (ref >60)
GFR calc non Af Amer: 19 mL/min — ABNORMAL LOW (ref >60)
Glucose, Bld: 105 mg/dL — ABNORMAL HIGH (ref 70–99)
Potassium: 3.7 mmol/L (ref 3.5–5.1)
Sodium: 142 mmol/L (ref 135–145)
Total Bilirubin: 1.6 mg/dL — ABNORMAL HIGH (ref 0.3–1.2)
Total Protein: 5.4 g/dL — ABNORMAL LOW (ref 6.5–8.1)

## 2018-04-28 LAB — TROPONIN I
TROPONIN I: 0.14 ng/mL — AB (ref <0.03)
Troponin I: 0.16 ng/mL (ref <0.03)

## 2018-04-28 LAB — CBC WITH DIFFERENTIAL/PLATELET
Abs Immature Granulocytes: 0.1 10*3/uL — ABNORMAL HIGH (ref 0.00–0.07)
BASOS PCT: 0 %
Basophils Absolute: 0 10*3/uL (ref 0.0–0.1)
EOS ABS: 0 10*3/uL (ref 0.0–0.5)
Eosinophils Relative: 0 %
HCT: 22.8 % — ABNORMAL LOW (ref 39.0–52.0)
Hemoglobin: 7.3 g/dL — ABNORMAL LOW (ref 13.0–17.0)
Immature Granulocytes: 1 %
Lymphocytes Relative: 6 %
Lymphs Abs: 1 10*3/uL (ref 0.7–4.0)
MCH: 30 pg (ref 26.0–34.0)
MCHC: 32 g/dL (ref 30.0–36.0)
MCV: 93.8 fL (ref 80.0–100.0)
Monocytes Absolute: 0.6 10*3/uL (ref 0.1–1.0)
Monocytes Relative: 4 %
Neutro Abs: 14.4 10*3/uL — ABNORMAL HIGH (ref 1.7–7.7)
Neutrophils Relative %: 89 %
Platelets: 303 10*3/uL (ref 150–400)
RBC: 2.43 MIL/uL — ABNORMAL LOW (ref 4.22–5.81)
RDW: 12.9 % (ref 11.5–15.5)
WBC: 16 10*3/uL — ABNORMAL HIGH (ref 4.0–10.5)
nRBC: 0 % (ref 0.0–0.2)

## 2018-04-28 LAB — PROCALCITONIN: Procalcitonin: 46.32 ng/mL

## 2018-04-28 LAB — PROTIME-INR
INR: 2.1 — ABNORMAL HIGH (ref 0.8–1.2)
Prothrombin Time: 23.5 seconds — ABNORMAL HIGH (ref 11.4–15.2)

## 2018-04-28 LAB — LACTIC ACID, PLASMA: Lactic Acid, Venous: 1.6 mmol/L (ref 0.5–1.9)

## 2018-04-28 LAB — MAGNESIUM: Magnesium: 1.6 mg/dL — ABNORMAL LOW (ref 1.7–2.4)

## 2018-04-28 MED ORDER — LACTATED RINGERS IV BOLUS
2000.0000 mL | Freq: Once | INTRAVENOUS | Status: AC
  Administered 2018-04-28: 2000 mL via INTRAVENOUS

## 2018-04-28 MED ORDER — TECHNETIUM TC 99M MEBROFENIN IV KIT
5.3000 | PACK | Freq: Once | INTRAVENOUS | Status: AC | PRN
  Administered 2018-04-28: 5.3 via INTRAVENOUS

## 2018-04-28 MED ORDER — MAGNESIUM SULFATE 2 GM/50ML IV SOLN
2.0000 g | Freq: Once | INTRAVENOUS | Status: AC
  Administered 2018-04-28: 2 g via INTRAVENOUS
  Filled 2018-04-28: qty 50

## 2018-04-28 MED ORDER — PANTOPRAZOLE SODIUM 40 MG PO PACK
40.0000 mg | PACK | ORAL | Status: DC
  Administered 2018-04-28 – 2018-05-01 (×4): 40 mg
  Filled 2018-04-28 (×3): qty 20

## 2018-04-28 NOTE — Progress Notes (Signed)
Palliative:  Consult received and chart reviewed. No family at bedside, patient sedated on vent. Discussed case with Dr. Marchelle Gearing. Per discussion, will hold off on GOC discussion now. Please call if PMT is needed.  Thank you for this consult.  Gerlean Ren, DNP, AGNP-C Palliative Medicine Team Team Phone # [phone]  Pager # [phone]

## 2018-04-28 NOTE — Telephone Encounter (Signed)
Received a fax request from [facility], stating Losartan is on backorder and requesting to change to something different. Informed pharmacy rep, that pt is currently admitted, and will hold off on changes for now as meds may be adjusted based off pt's status while admitted. Rep verbalized understanding.

## 2018-04-28 NOTE — Progress Notes (Signed)
Patient has not voided all shift. Bladder Scan completed. 140cc revealed. Elink paged for a possible order for a foley or In and Out. No order received at this time.

## 2018-04-28 NOTE — Consult Note (Signed)
NAME:  Vincent Barrett, MRN:  [id number], DOB:  1925/11/03, LOS: 2 ADMISSION DATE:  04/26/2018, CONSULTATION DATE:  04/27/2018 REFERRING MD: Hanley Ben, CHIEF COMPLAINT:  Respiratory Distress, Suspected sepsis   Brief History    85 year old male with history of paroxysmal atrial fibrillation on Eliquis, hypertension, COPD, CKD stage III, BPH, recent acute calculus cholecystitis status post laparoscopic cholecystectomy on 04/16/2018 followed by ERCP with biliary sphincterotomy and balloon extraction of the CBD stone on 04/22/2018 and subsequent discharge afterwards presented with abdominal pain and shortness of breath on 04/26/2018.  He was found to be febrile and tachycardic with significant leukocytosis.  CT abdomen and pelvis showed possible gallbladder fossa abscess versus biloma.  He was started on broad-spectrum antibiotics.  General surgery was consulted who recommended IR consult. IR planned to give Vitamin K and 4 units FFP, and evaluate INR and patient in the morning. Dr. Vassie Loll has called surgery to re-evaluate for source control at an earlier date. Pt has subsequently decompensated. He has developed respiratory distress, with RR of 48 . CXR shows low lung volumes with LLL consolidation suspicious for pneumonia, small pleural effusions. He sounds wet. Lactate is 4.2 up from 1.0 on 3/16. Max Lactate was 1.5 on 3/16 am. He will receive 4 units FFP. He is hypotensive at present, WBC has increased from 13.4 to 19.2 in the last 6 hours.Suspect this has evolved into peritonitis.Patient has been transferred to the ICU and PCCM have been consulted for management of care.Per nursing , the family does want everything done.   Past Medical History   Past Medical History:  Diagnosis Date   BPH (benign prostatic hyperplasia)    COPD (chronic obstructive pulmonary disease) (HCC)    Hypertension    OA (osteoarthritis)    PAF (paroxysmal atrial fibrillation) (HCC)     Significant Hospital Events   3/12 -  endoscopy, ERCP 3/16 Admission - 3/16>>CT Abdomen/ Pelvis Interval cholecystectomy. 7.6 x 10 x 17 cm fluid collection emanating from the gallbladder fossa and tracking posteriorly with a few bubbles of air within the collection which may reflect a biloma versus abscess 3/17 Transfer to ICU 3/17 Intubation and cvl - Intubated and Central Line placed for FFP and Levophed infusions.  ABG pending. CXR for CVC placement pending     Consults:  3/17>> IR 3/17>> Surgery  Procedures:  3/17>> Intubation 3/17 L IJ CVC 3/12 ERCP 3/12 Upper Endoscopy  Significant Diagnostic Tests:  3/16>>CT Abdomen/ Pelvis Interval cholecystectomy. 7.6 x 10 x 17 cm fluid collection emanating from the gallbladder fossa and tracking posteriorly with a few bubbles of air within the collection which may reflect a biloma versus abscess.  Micro Data:  3/16 Blood >> 3/16 Urine >> 3/17 Urine Strep > 3/17 Urine Legionella  Antimicrobials:  Cefepime 3/17>> Flagyl 3/17>> Vanc 3/16 >>   Interim history/subjective:   3/18 - 40% fio2, fent gtt, levophed . Gets agitated easily preventing SBT  Objective   Blood pressure (!) 107/53, pulse (!) 110, temperature 100.1 F (37.8 C), temperature source Oral, resp. rate (!) 22, height 5\' 6"  (1.676 m), weight 80.2 kg, SpO2 98 %.    Vent Mode: PRVC FiO2 (%):  [40 %-100 %] 40 % Set Rate:  [22 bmp] 22 bmp Vt Set:  [510 mL] 510 mL PEEP:  [5 cmH20] 5 cmH20 Plateau Pressure:  [20 cmH20-28 cmH20] 22 cmH20   Intake/Output Summary (Last 24 hours) at 04/28/2018 0957 Last data filed at 04/28/2018 0935 Gross per 24 hour  Intake 5382.58 ml  Output 575 ml  Net 4807.58 ml   Filed Weights   04/26/18 2356 04/28/18 0415  Weight: 79.5 kg 80.2 kg    General Appearance:  Looks criticall ill  Head:  Normocephalic, without obvious abnormality, atraumatic Eyes:  PERRL - yes, conjunctiva/corneas - clear     Ears:  Normal external ear canals, both ears Nose:  G tube -  no Throat:  ETT TUBE - yes , OG tube - yes Neck:  Supple,  No enlargement/tenderness/nodules Lungs: Clear to auscultation bilaterally, Ventilator   Synchrony - yes Heart:  S1 and S2 normal, no murmur, CVP - no.  Pressors - levophed 3mcg Abdomen:  Soft, no masses, no organomegaly. Has biliar drain Genitalia / Rectal:  Not done Extremities:  Extremities- intact Skin:  ntact in exposed areas . Sacral area - not examined Neurologic:  Sedation - fent gtt -> RASS - -2 but occ +2 . Moves all 4s - yes. CAM-ICU - x . Orientation - x      LABS    PULMONARY Recent Labs  Lab 04/27/18 1315 04/27/18 1657  PHART 7.422 7.403  PCO2ART 30.2* 33.2  PO2ART 79.2* 252.0*  HCO3 19.1* 20.7  TCO2  --  22  O2SAT 95.2 100.0    CBC Recent Labs  Lab 04/27/18 0336 04/27/18 1319 04/27/18 1657 04/28/18 0500  HGB 10.3* 10.6* 9.5* 7.3*  HCT 30.9* 31.6* 28.0* 22.8*  WBC 13.4* 19.2*  --  16.0*  PLT 366 365  --  303    COAGULATION Recent Labs  Lab 04/26/18 1508 04/27/18 0336 04/27/18 1725 04/28/18 0500  INR 2.2* 2.3* 1.7* 2.1*    CARDIAC   Recent Labs  Lab 04/26/18 2103 04/27/18 1319 04/27/18 1725 04/28/18 0037 04/28/18 0429  TROPONINI 0.10* 0.26* 0.19* 0.16* 0.14*   No results for input(s): PROBNP in the last 168 hours.   CHEMISTRY Recent Labs  Lab 04/22/18 0336 04/23/18 0420 04/26/18 1508 04/27/18 0336 04/27/18 1319 04/27/18 1657 04/28/18 0500  NA 138 137 135 137 139 140 142  K 3.9 3.8 2.9* 3.5 4.3 3.9 3.7  CL 107 106 99 104 108  --  110  CO2 23 22 24  20* 19*  --  22  GLUCOSE 101* 112* 118* 104* 120*  --  105*  BUN 13 17 21 21  30*  --  39*  CREATININE 1.41* 1.43* 1.42* 1.84* 2.60*  --  2.82*  CALCIUM 8.2* 8.4* 8.2* 7.6* 7.2*  --  7.4*  MG 1.7 1.7 2.0  --   --   --  1.6*   Estimated Creatinine Clearance: 17.3 mL/min (A) (by C-G formula based on SCr of 2.82 mg/dL (H)).   LIVER Recent Labs  Lab 04/23/18 0420 04/26/18 1508 04/27/18 0336 04/27/18 1319  04/27/18 1725 04/28/18 0500  AST 30 53* 50* 47*  --  44*  ALT 54* 58* 45* 45*  --  36  ALKPHOS 147* 159* 120 102  --  139*  BILITOT 1.4* 1.5* 2.1* 1.8*  --  1.6*  PROT 5.7* 6.8 5.4* 5.1*  --  5.4*  ALBUMIN 2.3* 2.8* 1.9* 1.7*  --  2.0*  INR  --  2.2* 2.3*  --  1.7* 2.1*     INFECTIOUS Recent Labs  Lab 04/27/18 1319 04/27/18 1725 04/28/18 0143 04/28/18 0500  LATICACIDVEN 4.2* 3.6* 1.6  --   PROCALCITON  --   --   --  46.32     ENDOCRINE CBG (last 3)  Recent Labs    04/27/18  2359 04/28/18 0359 04/28/18 0800  GLUCAP 120* 90 91         IMAGING x48h  - image(s) personally visualized  -   highlighted in bold Ct Abdomen Pelvis Wo Contrast  Result Date: 04/26/2018 CLINICAL DATA:  Gallbladder removed Friday. Nonproductive cough. Elevated temperature. Abdominal pain. EXAM: CT ABDOMEN AND PELVIS WITHOUT CONTRAST TECHNIQUE: Multidetector CT imaging of the abdomen and pelvis was performed following the standard protocol without IV contrast. COMPARISON:  CT abdomen 04/15/2018 FINDINGS: Lower chest: Bilateral small pleural effusions. Left lower lobe airspace disease likely reflecting atelectasis. Hepatobiliary: Normal liver. No intrahepatic biliary ductal dilatation. No hepatic mass. Interval cholecystectomy. 7.6 x 10 x 17 cm fluid collection emanating from the gallbladder fossa and tracking posteriorly with a few bubbles of air within the collection which may reflect a biloma or abscess. Mild inflammatory changes along the inferior margin of the liver. Small amount of perihepatic fluid. Pancreas: Unremarkable. No pancreatic ductal dilatation or surrounding inflammatory changes. Spleen: Normal in size without focal abnormality. Adrenals/Urinary Tract: Adrenal glands are unremarkable. Kidneys are normal, without renal calculi, focal lesion, or hydronephrosis. Bladder is unremarkable. Stomach/Bowel: Stomach is within normal limits. Appendix appears normal. No evidence of bowel wall  thickening, distention, or inflammatory changes. Diverticulosis without evidence of diverticulitis. Vascular/Lymphatic: Normal caliber abdominal aorta with atherosclerosis. No lymphadenopathy. Reproductive: Prostate is unremarkable. Other: No fluid collection or hematoma. Small amount of pelvic free fluid. Musculoskeletal: No acute osseous abnormality. No aggressive osseous lesion. Chronic compression fracture of L1 vertebral body. Diffuse thoracolumbar spondylosis. IMPRESSION: 1. Interval cholecystectomy. 7.6 x 10 x 17 cm fluid collection emanating from the gallbladder fossa and tracking posteriorly with a few bubbles of air within the collection which may reflect a biloma versus abscess. Electronically Signed   By: Elige Ko   On: 04/26/2018 17:29   Dg Chest 2 View  Result Date: 04/26/2018 CLINICAL DATA:  85 year old male with cough.  Initial encounter. EXAM: CHEST - 2 VIEW COMPARISON:  12/30/2017 CT.  12/23/2017 chest x-ray. FINDINGS: Cardiomegaly.  Pulmonary vascular congestion. Left lower lobe consolidation with pleural effusion. Left lower lobe mass as noted on prior CT of indeterminate etiology. Right lower lobe nodule and scattered granulomas better delineated on prior CT. Calcified aorta. No obvious acute osseous abnormality although evaluation limited. Prior compression fracture L1. IMPRESSION: 1. Left lower lobe consolidation with pleural effusion. 2. Left lower lobe mass as noted on prior CT of indeterminate etiology. 3. Cardiomegaly.  Pulmonary vascular congestion. 4. Right lower lobe nodule and scattered granulomas better delineated on prior CT. 5.  Aortic Atherosclerosis (ICD10-I70.0). Electronically Signed   By: Lacy Duverney M.D.   On: 04/26/2018 18:11   Dg Chest Port 1 View  Result Date: 04/28/2018 CLINICAL DATA:  Hypoxia EXAM: PORTABLE CHEST 1 VIEW COMPARISON:  April 27, 2018 FINDINGS: Endotracheal tube tip is 4.4 cm above the carina. Central catheter tip is in the superior vena cava  near the cavoatrial junction. Nasogastric tube tip and side port are below the diaphragm. No pneumothorax. There is persistent left lower lobe airspace consolidation with small left pleural effusion. The right lung is clear. There is cardiomegaly with pulmonary vascularity normal. No adenopathy. There is aortic atherosclerosis. There is degenerative change in each shoulder. IMPRESSION: Tube and catheter positions as described without pneumothorax. Persistent left lower lobe consolidation, likely pneumonia, with small left pleural effusion. Right lung clear. Stable cardiac prominence. Aortic Atherosclerosis (ICD10-I70.0). Electronically Signed   By: Bretta Bang III M.D.   On: 04/28/2018 07:08  Dg Chest Port 1 View  Result Date: 04/27/2018 CLINICAL DATA:  Respiratory failure EXAM: PORTABLE CHEST 1 VIEW COMPARISON:  Film from earlier in the same day. FINDINGS: Cardiac shadow remains enlarged. Endotracheal tube is now seen with the tip approximately 1.4 cm deep within the right mainstem bronchus. This should be withdrawn several cm. Gastric catheter is noted within the stomach. Left jugular central line is seen with the tip in the distal superior vena cava. No pneumothorax is noted. Increasing left basilar atelectasis is noted. IMPRESSION: Tubes and lines as described above. The endotracheal tube should be withdrawn approximately 3 cm. Increasing left basilar consolidation. These results will be called to the ordering clinician or representative by the Radiologist Assistant, and communication documented in the PACS or zVision Dashboard. Electronically Signed   By: Alcide Clever M.D.   On: 04/27/2018 17:04   Dg Chest Port 1 View  Result Date: 04/27/2018 CLINICAL DATA:  85 year old male with shortness of breath, tachypnea. EXAM: PORTABLE CHEST 1 VIEW COMPARISON:  Chest radiographs yesterday, and November 2019. CT Abdomen and Pelvis yesterday. FINDINGS: Portable AP semi upright view at 1335 hours. Continued  low lung volumes. Stable cardiac size and mediastinal contours. Visualized tracheal air column is within normal limits. Continued bilateral increased pulmonary interstitial markings and confluent retrocardiac opacity. No pneumothorax. Continued small pleural effusions suspected. Negative visible bowel gas pattern. IMPRESSION: Continued low lung volumes with left lower lobe consolidation suspicious for Pneumonia and small pleural effusions. Electronically Signed   By: Odessa Fleming M.D.   On: 04/27/2018 13:55   Dg Abd Portable 1v  Result Date: 04/27/2018 CLINICAL DATA:  Abdominal pain EXAM: PORTABLE ABDOMEN - 1 VIEW COMPARISON:  04/26/2018 FINDINGS: Scattered large and small bowel gas is noted. No obstructive changes are seen. Gastric catheter is noted within the stomach. No acute bony abnormality is seen. IMPRESSION: No acute abnormality is noted. Electronically Signed   By: Alcide Clever M.D.   On: 04/27/2018 17:02   Ct Image Guided Fluid Drain By Catheter  Result Date: 04/28/2018 CLINICAL DATA:  Subhepatic collection post cholecystectomy, hemodynamic instability, drainage requested EXAM: CT GUIDED DRAINAGE OF PERITONEAL ABSCESS ANESTHESIA/SEDATION: lidocaine 1% subcutaneous PROCEDURE: The procedure, risks, benefits, and alternatives were explained to the patient. Questions regarding the procedure were encouraged and answered. The patient understands and consents to the procedure. Select axial scans through the upper abdomen obtained. The collection was localized and an appropriate skin entry site was determined and marked. The operative field was prepped with chlorhexidinein a sterile fashion, and a sterile drape was applied covering the operative field. A sterile gown and sterile gloves were used for the procedure. Local anesthesia was provided with 1% Lidocaine. Under CT fluoroscopic guidance, 18 gauge percutaneous entry needle advanced into the collection. Bilious fluid returned. Amplatz wire advanced easily,  position confirmed on CT. Tract dilated to facilitate placement of a 12 French pigtail drain catheter, placed within the central dependent aspect of the collection. Catheter secured externally with 0 Prolene suture and StatLock and placed to gravity drain bag. 30 mL aspirate was sent for Gram stain and culture. The patient tolerated the procedure well. COMPLICATIONS: None immediate FINDINGS: Subhepatic fluid collection was localized. 12 French drain catheter placed as above. Sample of the bilious aspirate sent for Gram stain and culture. IMPRESSION: 1. Technically successful CT-guided right upper quadrant abscess drain catheter placement. Electronically Signed   By: Corlis Leak M.D.   On: 04/28/2018 09:13     Resolved Hospital Problem list  Assessment & Plan:  Acute Respiratory Failure in setting of suspected LLL pneumonia , small pleural effusions, and suspected peritonitis History of COPD Intubated 3/17>> Suspected aspiration   04/28/2018 -> 04/28/2018 - > does not meet criteria for SBT/Extubation in setting of Acute Respiratory Failure due to agitated delirium   Plan: PRVC No extubation HOB > 30 degreses   Circulatory shock  A: Improving - on levophed  P:  - MAP goal > 65 - levophed  - fluid bolus 2L    Acute Kidney Injury   04/28/2018 - Worse  Plan  - fluid bolus - foley   Electrolyte Imbalance  - low mag  Plan  - mag sulfate   Acute Abdomen 2/2 peritonitis due to gall bladder fossa abscess  04/28/2018 -> has bililar drain  Plan: Per Surgery>> Source control ( Consulted) ABX as above OG tube to low intermittent suction for decompression NPO TF/ TNA  when appropriate for nutrition GI and CCS following   Elevated INR  - Last Eliquis 3/16 at 7 am./ Vitamin K,  5 mg given  FFP 4 units   3/18 - no overt bleeding   Plan: Reversal of Eliquis for surgical source control - PRBC for hgb </= 6.9gm%    - exceptions are   -  if ACS  susepcted/confirmed then transfuse for hgb </= 8.0gm%,  or    -  active bleeding with hemodynamic instability, then transfuse regardless of hemoglobin value   At at all times try to transfuse 1 unit prbc as possible with exception of active hemorrhage    Endocrine Plan: CBG's Q 4 SSI      Best practice:  Diet: NPO Pain/Anxiety/Delirium protocol (if indicated): Fentanyl gtt/ Versed prn VAP protocol (if indicated): Ordered DVT prophylaxis: INR > 2.0 , PAS hose GI prophylaxis: Protonix Glucose control: CBG/ SSI Mobility: BR Code Status: Full Family Communication: Family updated by Dr. Vassie Loll 3/17. Wife Alona Bene updated on phone 04/28/2018 Disposition: ICU       ATTESTATION & SIGNATURE   The patient Raed Schalk is critically ill with multiple organ systems failure and requires high complexity decision making for assessment and support, frequent evaluation and titration of therapies, application of advanced monitoring technologies and extensive interpretation of multiple databases.   Critical Care Time devoted to patient care services described in this note is  30  Minutes. This time reflects time of care of this signee Dr Kalman Shan. This critical care time does not reflect procedure time, or teaching time or supervisory time of PA/NP/Med student/Med Resident etc but could involve care discussion time     Dr. Kalman Shan, M.D., [facility].C.P Pulmonary and Critical Care Medicine Staff Physician [facility] Pager: [phone], If no answer or between  15:00h - 7:00h: call [phone]  04/28/2018 9:58 AM

## 2018-04-28 NOTE — Progress Notes (Signed)
Referring Physician(s): Dr. Violeta Gelinas  Supervising Physician: Malachy Moan  Patient Status:  [facility] - In-pt  Chief Complaint: Follow up RUQ subhepatic biloma drain placed 3/17 by Dr. Deanne Coffer  Subjective:  Patient intubated, ventilated, unable to provide any history. No family or RN at bedside during exam.   Allergies: Quinine  Medications: Prior to Admission medications   Medication Sig Start Date End Date Taking? Authorizing Provider  amLODipine (NORVASC) 2.5 MG tablet Take 1 tablet (2.5 mg total) by mouth daily. 02/21/16  Yes Hilty, Lisette Abu, MD  Ascorbic Acid (VITAMIN C) 1000 MG tablet Take 1,000 mg by mouth daily.   Yes [provider]  doxazosin (CARDURA) 4 MG tablet Take 1 tablet (4 mg total) by mouth daily. KEEP OV. 01/16/17  Yes Hilty, Lisette Abu, MD  ELIQUIS 5 MG TABS tablet TAKE 1 TABLET BY MOUTH TWICE A DAY 04/26/18  Yes Hilty, Lisette Abu, MD  losartan-hydrochlorothiazide (HYZAAR) 100-25 MG tablet Take 1 tablet by mouth daily. 01/18/18  Yes Hilty, Lisette Abu, MD  metoprolol succinate (TOPROL-XL) 25 MG 24 hr tablet TAKE 1 TABLET (25 MG TOTAL) BY MOUTH DAILY. 04/13/17  Yes Hilty, Lisette Abu, MD  Multiple Vitamins-Minerals (PRESERVISION AREDS 2) CAPS Take 1 capsule by mouth daily.   Yes [provider]  ondansetron (ZOFRAN) 4 MG tablet Take 1 tablet (4 mg total) by mouth every 6 (six) hours as needed for nausea. 04/23/18  Yes Glade Lloyd, MD  traMADol (ULTRAM) 50 MG tablet Take 1 tablet (50 mg total) by mouth every 6 (six) hours as needed for severe pain. 04/23/18 04/23/19 Yes Glade Lloyd, MD     Vital Signs: BP (!) 98/56    Pulse (!) 110    Temp 100.1 F (37.8 C) (Oral)    Resp (!) 22    Ht  (1.676 m)    Wt 176 lb 12.9 oz (80.2 kg)    SpO2 97%    BMI 28.54 kg/m   Physical Exam Vitals signs and nursing note reviewed.  Constitutional:      Appearance: He is ill-appearing.     Comments: Intubated, sedated, ventilated.   HENT:     Head:  Normocephalic.  Cardiovascular:     Rate and Rhythm: Tachycardia present.  Abdominal:     Comments: (+) RUQ drain to gravity with dark bilious output. Insertion site with some dried blood but otherwise unremarkable.   Neurological:     Comments: Not responsive to verbal or tactile cues     Imaging: Ct Abdomen Pelvis Wo Contrast  Result Date: 04/26/2018 CLINICAL DATA:  Gallbladder removed Friday. Nonproductive cough. Elevated temperature. Abdominal pain. EXAM: CT ABDOMEN AND PELVIS WITHOUT CONTRAST TECHNIQUE: Multidetector CT imaging of the abdomen and pelvis was performed following the standard protocol without IV contrast. COMPARISON:  CT abdomen 04/15/2018 FINDINGS: Lower chest: Bilateral small pleural effusions. Left lower lobe airspace disease likely reflecting atelectasis. Hepatobiliary: Normal liver. No intrahepatic biliary ductal dilatation. No hepatic mass. Interval cholecystectomy. 7.6 x 10 x 17 cm fluid collection emanating from the gallbladder fossa and tracking posteriorly with a few bubbles of air within the collection which may reflect a biloma or abscess. Mild inflammatory changes along the inferior margin of the liver. Small amount of perihepatic fluid. Pancreas: Unremarkable. No pancreatic ductal dilatation or surrounding inflammatory changes. Spleen: Normal in size without focal abnormality. Adrenals/Urinary Tract: Adrenal glands are unremarkable. Kidneys are normal, without renal calculi, focal lesion, or hydronephrosis. Bladder is unremarkable. Stomach/Bowel: Stomach is within  normal limits. Appendix appears normal. No evidence of bowel wall thickening, distention, or inflammatory changes. Diverticulosis without evidence of diverticulitis. Vascular/Lymphatic: Normal caliber abdominal aorta with atherosclerosis. No lymphadenopathy. Reproductive: Prostate is unremarkable. Other: No fluid collection or hematoma. Small amount of pelvic free fluid. Musculoskeletal: No acute osseous  abnormality. No aggressive osseous lesion. Chronic compression fracture of L1 vertebral body. Diffuse thoracolumbar spondylosis. IMPRESSION: 1. Interval cholecystectomy. 7.6 x 10 x 17 cm fluid collection emanating from the gallbladder fossa and tracking posteriorly with a few bubbles of air within the collection which may reflect a biloma versus abscess. Electronically Signed   By: Elige Ko   On: 04/26/2018 17:29   Dg Chest 2 View  Result Date: 04/26/2018 CLINICAL DATA:  85 year old male with cough.  Initial encounter. EXAM: CHEST - 2 VIEW COMPARISON:  12/30/2017 CT.  12/23/2017 chest x-ray. FINDINGS: Cardiomegaly.  Pulmonary vascular congestion. Left lower lobe consolidation with pleural effusion. Left lower lobe mass as noted on prior CT of indeterminate etiology. Right lower lobe nodule and scattered granulomas better delineated on prior CT. Calcified aorta. No obvious acute osseous abnormality although evaluation limited. Prior compression fracture L1. IMPRESSION: 1. Left lower lobe consolidation with pleural effusion. 2. Left lower lobe mass as noted on prior CT of indeterminate etiology. 3. Cardiomegaly.  Pulmonary vascular congestion. 4. Right lower lobe nodule and scattered granulomas better delineated on prior CT. 5.  Aortic Atherosclerosis (ICD10-I70.0). Electronically Signed   By: Lacy Duverney M.D.   On: 04/26/2018 18:11   Dg Chest Port 1 View  Result Date: 04/28/2018 CLINICAL DATA:  Hypoxia EXAM: PORTABLE CHEST 1 VIEW COMPARISON:  April 27, 2018 FINDINGS: Endotracheal tube tip is 4.4 cm above the carina. Central catheter tip is in the superior vena cava near the cavoatrial junction. Nasogastric tube tip and side port are below the diaphragm. No pneumothorax. There is persistent left lower lobe airspace consolidation with small left pleural effusion. The right lung is clear. There is cardiomegaly with pulmonary vascularity normal. No adenopathy. There is aortic atherosclerosis. There is  degenerative change in each shoulder. IMPRESSION: Tube and catheter positions as described without pneumothorax. Persistent left lower lobe consolidation, likely pneumonia, with small left pleural effusion. Right lung clear. Stable cardiac prominence. Aortic Atherosclerosis (ICD10-I70.0). Electronically Signed   By: Bretta Bang III M.D.   On: 04/28/2018 07:08   Dg Chest Port 1 View  Result Date: 04/27/2018 CLINICAL DATA:  Respiratory failure EXAM: PORTABLE CHEST 1 VIEW COMPARISON:  Film from earlier in the same day. FINDINGS: Cardiac shadow remains enlarged. Endotracheal tube is now seen with the tip approximately 1.4 cm deep within the right mainstem bronchus. This should be withdrawn several cm. Gastric catheter is noted within the stomach. Left jugular central line is seen with the tip in the distal superior vena cava. No pneumothorax is noted. Increasing left basilar atelectasis is noted. IMPRESSION: Tubes and lines as described above. The endotracheal tube should be withdrawn approximately 3 cm. Increasing left basilar consolidation. These results will be called to the ordering clinician or representative by the Radiologist Assistant, and communication documented in the PACS or zVision Dashboard. Electronically Signed   By: Alcide Clever M.D.   On: 04/27/2018 17:04   Dg Chest Port 1 View  Result Date: 04/27/2018 CLINICAL DATA:  85 year old male with shortness of breath, tachypnea. EXAM: PORTABLE CHEST 1 VIEW COMPARISON:  Chest radiographs yesterday, and November 2019. CT Abdomen and Pelvis yesterday. FINDINGS: Portable AP semi upright view at 1335 hours. Continued low  lung volumes. Stable cardiac size and mediastinal contours. Visualized tracheal air column is within normal limits. Continued bilateral increased pulmonary interstitial markings and confluent retrocardiac opacity. No pneumothorax. Continued small pleural effusions suspected. Negative visible bowel gas pattern. IMPRESSION: Continued  low lung volumes with left lower lobe consolidation suspicious for Pneumonia and small pleural effusions. Electronically Signed   By: Odessa FlemingH  Hall M.D.   On: 04/27/2018 13:55   Dg Abd Portable 1v  Result Date: 04/27/2018 CLINICAL DATA:  Abdominal pain EXAM: PORTABLE ABDOMEN - 1 VIEW COMPARISON:  04/26/2018 FINDINGS: Scattered large and small bowel gas is noted. No obstructive changes are seen. Gastric catheter is noted within the stomach. No acute bony abnormality is seen. IMPRESSION: No acute abnormality is noted. Electronically Signed   By: Alcide CleverMark  Lukens M.D.   On: 04/27/2018 17:02   Ct Image Guided Fluid Drain By Catheter  Result Date: 04/28/2018 CLINICAL DATA:  Subhepatic collection post cholecystectomy, hemodynamic instability, drainage requested EXAM: CT GUIDED DRAINAGE OF PERITONEAL ABSCESS ANESTHESIA/SEDATION: lidocaine 1% subcutaneous PROCEDURE: The procedure, risks, benefits, and alternatives were explained to the patient. Questions regarding the procedure were encouraged and answered. The patient understands and consents to the procedure. Select axial scans through the upper abdomen obtained. The collection was localized and an appropriate skin entry site was determined and marked. The operative field was prepped with chlorhexidinein a sterile fashion, and a sterile drape was applied covering the operative field. A sterile gown and sterile gloves were used for the procedure. Local anesthesia was provided with 1% Lidocaine. Under CT fluoroscopic guidance, 18 gauge percutaneous entry needle advanced into the collection. Bilious fluid returned. Amplatz wire advanced easily, position confirmed on CT. Tract dilated to facilitate placement of a 12 French pigtail drain catheter, placed within the central dependent aspect of the collection. Catheter secured externally with 0 Prolene suture and StatLock and placed to gravity drain bag. 30 mL aspirate was sent for Gram stain and culture. The patient tolerated the  procedure well. COMPLICATIONS: None immediate FINDINGS: Subhepatic fluid collection was localized. 12 French drain catheter placed as above. Sample of the bilious aspirate sent for Gram stain and culture. IMPRESSION: 1. Technically successful CT-guided right upper quadrant abscess drain catheter placement. Electronically Signed   By: Corlis Leak  Hassell M.D.   On: 04/28/2018 09:13    Labs:  CBC: Recent Labs    04/26/18 1508 04/27/18 0336 04/27/18 1319 04/27/18 1657 04/28/18 0500  WBC 19.9* 13.4* 19.2*  --  16.0*  HGB 11.2* 10.3* 10.6* 9.5* 7.3*  HCT 33.3* 30.9* 31.6* 28.0* 22.8*  PLT 408* 366 365  --  303    COAGS: Recent Labs    04/26/18 1508 04/27/18 0336 04/27/18 1725 04/28/18 0500  INR 2.2* 2.3* 1.7* 2.1*    BMP: Recent Labs    04/26/18 1508 04/27/18 0336 04/27/18 1319 04/27/18 1657 04/28/18 0500  NA 135 137 139 140 142  K 2.9* 3.5 4.3 3.9 3.7  CL 99 104 108  --  110  CO2 24 20* 19*  --  22  GLUCOSE 118* 104* 120*  --  105*  BUN 21 21 30*  --  39*  CALCIUM 8.2* 7.6* 7.2*  --  7.4*  CREATININE 1.42* 1.84* 2.60*  --  2.82*  GFRNONAA 43* 32* 21*  --  19*  GFRAA 50* 37* 24*  --  22*    LIVER FUNCTION TESTS: Recent Labs    04/26/18 1508 04/27/18 0336 04/27/18 1319 04/28/18 0500  BILITOT 1.5* 2.1* 1.8* 1.6*  AST 53* 50* 47* 44*  ALT 58* 45* 45* 36  ALKPHOS 159* 120 102 139*  PROT 6.8 5.4* 5.1* 5.4*  ALBUMIN 2.8* 1.9* 1.7* 2.0*    Assessment and Plan:  85 y/o M s/p RUQ subhepatic biloma drain placed 3/17 by Dr. Deanne Coffer.   Patient developed respiratory distress and ultimately required intubation - he remains intubated, sedated and ventilated on exam today. Drain with 575 cc output recorded in I/O, appears bilious on exam. Tmax 100.4, tachycardic, hypotensive, WBC 16.0 (previously 19.2), hgb 7.3, plt 303. Currently on cefepime + metronidazole per primary team.  Continue TID flushes with 3-5 cc NS, record output once daily.   IR will continue to follow -  please call with questions or concerns.   Electronically Signed: Villa Herb, PA-C 04/28/2018, 11:05 AM   I spent a total of 15 Minutes at the the patient's bedside AND on the patient's hospital floor or unit, greater than 50% of which was counseling/coordinating care for RUQ drain follow up.

## 2018-04-28 NOTE — TOC Initial Note (Addendum)
Transition of Care [facility]) - Initial/Assessment Note    Patient Details  Name: Vincent Barrett MRN: [id number] Date of Birth: 1925-08-29  [facility]) CM/SW Contact:    Cherylann Parr, RN Phone Number: 04/28/2018, 9:56 AM  Clinical Narrative:   CM performed assessment with wife of pt as pt was unable.  Pt very recently discharged home with wife - active with [facility] for RN and PT - wife request to add aide on discharge - wife informed CM that SNF is not an option at discharge.  Wife informed that [facility] aide will only provide assistance with ADLs 1 to 2 times a week for approximately 45 mins.  Recent hospitalization 3/4 -  04/23/2018 for acute calculus cholecystitis, with choledocholithiasis.  Underwent laparoscopic cholecystectomy 04/16/2018, and subsequent ERCP and EUS 03/24/2018, with biliary sphincterotomy and balloon extraction of common bile duct stone - now admitted with sepsis.  PT will need to evaluate when pt is able                 Expected Discharge Plan: Skilled Nursing Facility - wife informed CM that pt would not go to SNF Barriers to Discharge: (Complications associate with recent bowel surgery)   Patient Goals and CMS Choice        Expected Discharge Plan and Services Expected Discharge Plan: Skilled Nursing Facility Discharge Planning Services: CM Consult   Living arrangements for the past 2 months: Single Family Home                          Prior Living Arrangements/Services Living arrangements for the past 2 months: Single Family Home Lives with:: Spouse Patient language and need for interpreter reviewed:: Yes Do you feel safe going back to the place where you live?: Yes(per wife pt plans to return home however request HH aide)      Need for Family Participation in Patient Care: Yes (Comment) Care giver support system in place?: Yes (comment) Current home services: Home PT, Home RN([facility]) Criminal Activity/Legal Involvement Pertinent to Current  Situation/Hospitalization: No - Comment as needed  Activities of Daily Living Home Assistive Devices/Equipment: Cane (specify quad or straight) ADL Screening (condition at time of admission) Patient's cognitive ability adequate to safely complete daily activities?: Yes Is the patient deaf or have difficulty hearing?: Yes Does the patient have difficulty seeing, even when wearing glasses/contacts?: No Does the patient have difficulty concentrating, remembering, or making decisions?: No Patient able to express need for assistance with ADLs?: Yes Does the patient have difficulty dressing or bathing?: Yes Independently performs ADLs?: No Communication: Independent Dressing (OT): Needs assistance Is this a change from baseline?: Pre-admission baseline Grooming: Independent Feeding: Independent Bathing: Needs assistance Is this a change from baseline?: Pre-admission baseline Toileting: Needs assistance Is this a change from baseline?: Pre-admission baseline In/Out Bed: Needs assistance Is this a change from baseline?: Pre-admission baseline Walks in Home: Needs assistance Is this a change from baseline?: Pre-admission baseline Does the patient have difficulty walking or climbing stairs?: Yes Weakness of Legs: Both Weakness of Arms/Hands: None  Permission Sought/Granted Permission sought to share information with : Case Manager    Share Information with NAME: Orlando Muston           Emotional Assessment              Admission diagnosis:  Intra-abdominal fluid collection [R18.8] HCAP (healthcare-associated pneumonia) [J18.9] Fever in adult [R50.9] Patient Active Problem List   Diagnosis Date Noted  .  Sepsis (HCC) 04/26/2018  . Abdominal pain 04/15/2018  . Hypokalemia 04/15/2018  . CKD (chronic kidney disease) stage 3, GFR 30-59 ml/min (HCC) 04/15/2018  . Cholelithiasis with choledocholithiasis 04/15/2018  . Solitary pulmonary nodule 09/04/2017  . Upper airway cough  syndrome 09/03/2017  . On anticoagulant therapy 03/25/2017  . Atrial flutter (HCC) 02/20/2017  . Leg edema, right 02/21/2016  . Constipation 02/19/2015  . Atrial fibrillation (HCC) 02/17/2013  . Essential hypertension 02/17/2013  . COPD (chronic obstructive pulmonary disease) (HCC) 02/17/2013   PCP:  [name], MD Pharmacy:   [facility] [phone] - [place], [place] - [address] [place] [address] Phone: [phone] Fax: [phone]     Social Determinants of Health (SDOH) Interventions    Readmission Risk Interventions 30 Day Unplanned Readmission Risk Score     ED to Hosp-Admission (Current) from 04/26/2018 in [place] [facility] Medical ICU  30 Day Unplanned Readmission Risk Score (%)  25 Filed at 04/28/2018 0801     This score is the patient's risk of an unplanned readmission within 30 days of being discharged (0 -100%). The score is based on dignosis, age, lab data, medications, orders, and past utilization.   Low:  0-14.9   Medium: 15-21.9   High: 22-29.9   Extreme: 30 and above       No flowsheet data found.

## 2018-04-28 NOTE — Progress Notes (Signed)
Transported pt from 3M05 to [facility] on ventilator. Pt stable throughout with no complications. VS within normal limits

## 2018-04-28 NOTE — Consult Note (Addendum)
Referring Provider:  [facility] Primary Care Physician:  Christain Sacramento, MD   Reason for Consultation: Gallbladder fossa abscess.  Evaluate for repeat ERCP  HPI: Vincent Barrett is a 85 y.o. male with past medical history of atrial fibrillation who was recently discharged home presented to the hospital with worsening abdominal pain and shortness of breath.GI is consulted for evaluation for possible repeat ERCP.  He recently underwent laparoscopic cholecystectomy for acute cholecystitis on April 16, 2018.  He was also found to have a choledocholithiasis.  EUS on April 22, 2018 confirmed small common bile duct stone.  He underwent ERCP with sphincterotomy and stone removal on April 22, 2018.  He was discharged on April 23, 2018.   Presented to the hospital with worsening abdominal pain and shortness of breath  On 04/26/2018.   He was found to have leukocytosis on initial evaluation.  CT abdomen pelvis without contrast on admission revealed a large 7.6 x 10 x 17 cm fluid collection around gallbladder fossa with bubbles of air within the collection concerning for abscess or Biloma.    Underwent CT-guided right upper quadrant abscess drain catheter placement yesterday.  Last dose of Eliquis prior to admission on March 16  He had acute respiratory failure yesterday and was intubated.   Patient seen and examined at bedside.  Remains intubated.  .  No family at bedside.   Past Medical History:  Diagnosis Date  . BPH (benign prostatic hyperplasia)   . COPD (chronic obstructive pulmonary disease) ([place])   . Hypertension   . OA (osteoarthritis)   . PAF (paroxysmal atrial fibrillation) ([place])     Past Surgical History:  Procedure Laterality Date  . CARDIOVERSION N/A 03/31/2017   Procedure: CARDIOVERSION;  Surgeon: Dorothy Spark, MD;  Location: [place];  Service: Cardiovascular;  Laterality: N/A;  . CARPAL TUNNEL RELEASE  02/2010   Dr. Christella Noa  . CHOLECYSTECTOMY N/A 04/16/2018   Procedure: LAPAROSCOPIC  CHOLECYSTECTOMY WITH INTRAOPERATIVE CHOLANGIOGRAM;  Surgeon: Donnie Mesa, MD;  Location: [place];  Service: General;  Laterality: N/A;  . ERCP N/A 04/22/2018   Procedure: ENDOSCOPIC RETROGRADE CHOLANGIOPANCREATOGRAPHY (ERCP);  Surgeon: Arta Silence, MD;  Location: [facility] ENDOSCOPY;  Service: Endoscopy;  Laterality: N/A;  . ESOPHAGOGASTRODUODENOSCOPY (EGD) WITH PROPOFOL N/A 04/22/2018   Procedure: ESOPHAGOGASTRODUODENOSCOPY (EGD) WITH PROPOFOL;  Surgeon: Arta Silence, MD;  Location: [place];  Service: Endoscopy;  Laterality: N/A;  . EUS N/A 04/22/2018   Procedure: UPPER ENDOSCOPIC ULTRASOUND (EUS) RADIAL;  Surgeon: Arta Silence, MD;  Location: [place];  Service: Endoscopy;  Laterality: N/A;  . NM MYOCAR PERF WALL MOTION  08/08/2011   lexiscan; normal pattern of perfusion in all regions; post-stress EF 69%; low risk scan   . REMOVAL OF STONES  04/22/2018   Procedure: REMOVAL OF STONES;  Surgeon: Arta Silence, MD;  Location: [facility] ENDOSCOPY;  Service: Endoscopy;;  . Joan Mayans  04/22/2018   Procedure: Joan Mayans;  Surgeon: Arta Silence, MD;  Location: [place] ENDOSCOPY;  Service: Endoscopy;;  . TRANSTHORACIC ECHOCARDIOGRAM  2013   EF 52%, mild AV leaflet thickening; trace TR    Prior to Admission medications   Medication Sig Start Date End Date Taking? Authorizing Provider  amLODipine (NORVASC) 2.5 MG tablet Take 1 tablet (2.5 mg total) by mouth daily. 02/21/16  Yes Hilty, Nadean Corwin, MD  Ascorbic Acid (VITAMIN C) 1000 MG tablet Take 1,000 mg by mouth daily.   Yes [provider]  doxazosin (CARDURA) 4 MG tablet Take 1 tablet (4 mg total) by mouth daily. KEEP  OV. 01/16/17  Yes Hilty, Nadean Corwin, MD  ELIQUIS 5 MG TABS tablet TAKE 1 TABLET BY MOUTH TWICE A DAY 04/26/18  Yes Hilty, Nadean Corwin, MD  losartan-hydrochlorothiazide (HYZAAR) 100-25 MG tablet Take 1 tablet by mouth daily. 01/18/18  Yes Hilty, Nadean Corwin, MD  metoprolol succinate (TOPROL-XL) 25 MG 24 hr tablet TAKE 1 TABLET  (25 MG TOTAL) BY MOUTH DAILY. 04/13/17  Yes Hilty, Nadean Corwin, MD  Multiple Vitamins-Minerals (PRESERVISION AREDS 2) CAPS Take 1 capsule by mouth daily.   Yes [provider]  ondansetron (ZOFRAN) 4 MG tablet Take 1 tablet (4 mg total) by mouth every 6 (six) hours as needed for nausea. 04/23/18  Yes Aline August, MD  traMADol (ULTRAM) 50 MG tablet Take 1 tablet (50 mg total) by mouth every 6 (six) hours as needed for severe pain. 04/23/18 04/23/19 Yes Aline August, MD    Scheduled Meds: . chlorhexidine gluconate (MEDLINE KIT)  15 mL Mouth Rinse BID  . Chlorhexidine Gluconate Cloth  6 each Topical Daily  . feeding supplement (ENSURE ENLIVE)  237 mL Oral BID BM  . insulin aspart  1-3 Units Subcutaneous Q4H  . mouth rinse  15 mL Mouth Rinse 10 times per day  . pantoprazole sodium  40 mg Per Tube Q24H  . sodium chloride flush  10-40 mL Intracatheter Q12H  . sodium chloride flush  5 mL Intracatheter Q8H   Continuous Infusions: . 0.9 % NaCl with KCl 40 mEq / L 125 mL/hr at 04/28/18 0935  . ceFEPime (MAXIPIME) IV Stopped (04/27/18 1908)  . fentaNYL infusion INTRAVENOUS Stopped (04/28/18 0925)  . metronidazole Stopped (04/28/18 1572)  . norepinephrine (LEVOPHED) Adult infusion 3 mcg/min (04/28/18 0935)   PRN Meds:.acetaminophen **OR** acetaminophen, bisacodyl, docusate, fentaNYL, ipratropium-albuterol, midazolam, morphine injection, ondansetron **OR** ondansetron (ZOFRAN) IV, sodium chloride flush  Allergies as of 04/26/2018 - Review Complete 04/26/2018  Allergen Reaction Noted  . Quinine Nausea And Vomiting 04/17/2015    Family History  Problem Relation Age of Onset  . Heart disease Neg Hx     Social History   Socioeconomic History  . Marital status: Married    Spouse name: Not on file  . Number of children: 4  . Years of education: Not on file  . Highest education level: Not on file  Occupational History  . Not on file  Social Needs  . Financial resource strain: Not on  file  . Food insecurity:    Worry: Not on file    Inability: Not on file  . Transportation needs:    Medical: Not on file    Non-medical: Not on file  Tobacco Use  . Smoking status: Former Smoker    Packs/day: 2.00    Years: 11.00    Pack years: 22.00    Types: Cigarettes    Last attempt to quit: 02/10/1957    Years since quitting: 61.2  . Smokeless tobacco: Never Used  Substance and Sexual Activity  . Alcohol use: No  . Drug use: No  . Sexual activity: Not on file  Lifestyle  . Physical activity:    Days per week: Not on file    Minutes per session: Not on file  . Stress: Not on file  Relationships  . Social connections:    Talks on phone: Not on file    Gets together: Not on file    Attends religious service: Not on file    Active member of club or organization: Not on file  Attends meetings of clubs or organizations: Not on file    Relationship status: Not on file  . Intimate partner violence:    Fear of current or ex partner: Not on file    Emotionally abused: Not on file    Physically abused: Not on file    Forced sexual activity: Not on file  Other Topics Concern  . Not on file  Social History Narrative   [place]    Review of Systems:  Not able to obtain.   Physical Exam: Vital signs: Vitals:   04/28/18 0900 04/28/18 0930  BP: (!) 101/55 (!) 107/53  Pulse: (!) 110 (!) 110  Resp: (!) 22 (!) 22  Temp:    SpO2: 96% 98%   Last BM Date: 04/26/18  General.  Currently intubated. HEENT : ETT tube in place. OG tube in place  Lungs.  Clear to auscultate.  Currently on ventilator.  Anterior exam only. Heart.  Rate rhythm regular.  No murmur ABD :  Mild distended.  Right upper quadrant drain noted.  Minimal drain output at this time.  Not able to appreciate tenderness.  Bowel sounds present. LE : trace edema  Neuro : sedated   GI:  Lab Results: Recent Labs    04/27/18 0336 04/27/18 1319 04/27/18 1657 04/28/18 0500  WBC 13.4* 19.2*   --  16.0*  HGB 10.3* 10.6* 9.5* 7.3*  HCT 30.9* 31.6* 28.0* 22.8*  PLT 366 365  --  303   BMET Recent Labs    04/27/18 0336 04/27/18 1319 04/27/18 1657 04/28/18 0500  NA 137 139 140 142  K 3.5 4.3 3.9 3.7  CL 104 108  --  110  CO2 20* 19*  --  22  GLUCOSE 104* 120*  --  105*  BUN 21 30*  --  39*  CREATININE 1.84* 2.60*  --  2.82*  CALCIUM 7.6* 7.2*  --  7.4*   LFT Recent Labs    04/28/18 0500  PROT 5.4*  ALBUMIN 2.0*  AST 44*  ALT 36  ALKPHOS 139*  BILITOT 1.6*   PT/INR Recent Labs    04/27/18 1725 04/28/18 0500  LABPROT 20.1* 23.5*  INR 1.7* 2.1*     Studies/Results: Ct Abdomen Pelvis Wo Contrast  Result Date: 04/26/2018 CLINICAL DATA:  Gallbladder removed Friday. Nonproductive cough. Elevated temperature. Abdominal pain. EXAM: CT ABDOMEN AND PELVIS WITHOUT CONTRAST TECHNIQUE: Multidetector CT imaging of the abdomen and pelvis was performed following the standard protocol without IV contrast. COMPARISON:  CT abdomen 04/15/2018 FINDINGS: Lower chest: Bilateral small pleural effusions. Left lower lobe airspace disease likely reflecting atelectasis. Hepatobiliary: Normal liver. No intrahepatic biliary ductal dilatation. No hepatic mass. Interval cholecystectomy. 7.6 x 10 x 17 cm fluid collection emanating from the gallbladder fossa and tracking posteriorly with a few bubbles of air within the collection which may reflect a biloma or abscess. Mild inflammatory changes along the inferior margin of the liver. Small amount of perihepatic fluid. Pancreas: Unremarkable. No pancreatic ductal dilatation or surrounding inflammatory changes. Spleen: Normal in size without focal abnormality. Adrenals/Urinary Tract: Adrenal glands are unremarkable. Kidneys are normal, without renal calculi, focal lesion, or hydronephrosis. Bladder is unremarkable. Stomach/Bowel: Stomach is within normal limits. Appendix appears normal. No evidence of bowel wall thickening, distention, or inflammatory  changes. Diverticulosis without evidence of diverticulitis. Vascular/Lymphatic: Normal caliber abdominal aorta with atherosclerosis. No lymphadenopathy. Reproductive: Prostate is unremarkable. Other: No fluid collection or hematoma. Small amount of pelvic free fluid. Musculoskeletal: No acute  osseous abnormality. No aggressive osseous lesion. Chronic compression fracture of L1 vertebral body. Diffuse thoracolumbar spondylosis. IMPRESSION: 1. Interval cholecystectomy. 7.6 x 10 x 17 cm fluid collection emanating from the gallbladder fossa and tracking posteriorly with a few bubbles of air within the collection which may reflect a biloma versus abscess. Electronically Signed   By: Kathreen Devoid   On: 04/26/2018 17:29   Dg Chest 2 View  Result Date: 04/26/2018 CLINICAL DATA:  85 year old male with cough.  Initial encounter. EXAM: CHEST - 2 VIEW COMPARISON:  12/30/2017 CT.  12/23/2017 chest x-ray. FINDINGS: Cardiomegaly.  Pulmonary vascular congestion. Left lower lobe consolidation with pleural effusion. Left lower lobe mass as noted on prior CT of indeterminate etiology. Right lower lobe nodule and scattered granulomas better delineated on prior CT. Calcified aorta. No obvious acute osseous abnormality although evaluation limited. Prior compression fracture L1. IMPRESSION: 1. Left lower lobe consolidation with pleural effusion. 2. Left lower lobe mass as noted on prior CT of indeterminate etiology. 3. Cardiomegaly.  Pulmonary vascular congestion. 4. Right lower lobe nodule and scattered granulomas better delineated on prior CT. 5.  Aortic Atherosclerosis (ICD10-I70.0). Electronically Signed   By: Genia Del M.D.   On: 04/26/2018 18:11   Dg Chest Port 1 View  Result Date: 04/28/2018 CLINICAL DATA:  Hypoxia EXAM: PORTABLE CHEST 1 VIEW COMPARISON:  April 27, 2018 FINDINGS: Endotracheal tube tip is 4.4 cm above the carina. Central catheter tip is in the superior vena cava near the cavoatrial junction. Nasogastric  tube tip and side port are below the diaphragm. No pneumothorax. There is persistent left lower lobe airspace consolidation with small left pleural effusion. The right lung is clear. There is cardiomegaly with pulmonary vascularity normal. No adenopathy. There is aortic atherosclerosis. There is degenerative change in each shoulder. IMPRESSION: Tube and catheter positions as described without pneumothorax. Persistent left lower lobe consolidation, likely pneumonia, with small left pleural effusion. Right lung clear. Stable cardiac prominence. Aortic Atherosclerosis (ICD10-I70.0). Electronically Signed   By: Lowella Grip III M.D.   On: 04/28/2018 07:08   Dg Chest Port 1 View  Result Date: 04/27/2018 CLINICAL DATA:  Respiratory failure EXAM: PORTABLE CHEST 1 VIEW COMPARISON:  Film from earlier in the same day. FINDINGS: Cardiac shadow remains enlarged. Endotracheal tube is now seen with the tip approximately 1.4 cm deep within the right mainstem bronchus. This should be withdrawn several cm. Gastric catheter is noted within the stomach. Left jugular central line is seen with the tip in the distal superior vena cava. No pneumothorax is noted. Increasing left basilar atelectasis is noted. IMPRESSION: Tubes and lines as described above. The endotracheal tube should be withdrawn approximately 3 cm. Increasing left basilar consolidation. These results will be called to the ordering clinician or representative by the Radiologist Assistant, and communication documented in the PACS or zVision Dashboard. Electronically Signed   By: Inez Catalina M.D.   On: 04/27/2018 17:04   Dg Chest Port 1 View  Result Date: 04/27/2018 CLINICAL DATA:  85 year old male with shortness of breath, tachypnea. EXAM: PORTABLE CHEST 1 VIEW COMPARISON:  Chest radiographs yesterday, and November 2019. CT Abdomen and Pelvis yesterday. FINDINGS: Portable AP semi upright view at 1335 hours. Continued low lung volumes. Stable cardiac size and  mediastinal contours. Visualized tracheal air column is within normal limits. Continued bilateral increased pulmonary interstitial markings and confluent retrocardiac opacity. No pneumothorax. Continued small pleural effusions suspected. Negative visible bowel gas pattern. IMPRESSION: Continued low lung volumes with left lower lobe consolidation  suspicious for Pneumonia and small pleural effusions. Electronically Signed   By: Genevie Ann M.D.   On: 04/27/2018 13:55   Dg Abd Portable 1v  Result Date: 04/27/2018 CLINICAL DATA:  Abdominal pain EXAM: PORTABLE ABDOMEN - 1 VIEW COMPARISON:  04/26/2018 FINDINGS: Scattered large and small bowel gas is noted. No obstructive changes are seen. Gastric catheter is noted within the stomach. No acute bony abnormality is seen. IMPRESSION: No acute abnormality is noted. Electronically Signed   By: Inez Catalina M.D.   On: 04/27/2018 17:02   Ct Image Guided Fluid Drain By Catheter  Result Date: 04/28/2018 CLINICAL DATA:  Subhepatic collection post cholecystectomy, hemodynamic instability, drainage requested EXAM: CT GUIDED DRAINAGE OF PERITONEAL ABSCESS ANESTHESIA/SEDATION: lidocaine 1% subcutaneous PROCEDURE: The procedure, risks, benefits, and alternatives were explained to the patient. Questions regarding the procedure were encouraged and answered. The patient understands and consents to the procedure. Select axial scans through the upper abdomen obtained. The collection was localized and an appropriate skin entry site was determined and marked. The operative field was prepped with chlorhexidinein a sterile fashion, and a sterile drape was applied covering the operative field. A sterile gown and sterile gloves were used for the procedure. Local anesthesia was provided with 1% Lidocaine. Under CT fluoroscopic guidance, 18 gauge percutaneous entry needle advanced into the collection. Bilious fluid returned. Amplatz wire advanced easily, position confirmed on CT. Tract dilated to  facilitate placement of a 12 French pigtail drain catheter, placed within the central dependent aspect of the collection. Catheter secured externally with 0 Prolene suture and StatLock and placed to gravity drain bag. 30 mL aspirate was sent for Gram stain and culture. The patient tolerated the procedure well. COMPLICATIONS: None immediate FINDINGS: Subhepatic fluid collection was localized. 12 French drain catheter placed as above. Sample of the bilious aspirate sent for Gram stain and culture. IMPRESSION: 1. Technically successful CT-guided right upper quadrant abscess drain catheter placement. Electronically Signed   By: Lucrezia Europe M.D.   On: 04/28/2018 09:13    Impression/Plan:  large 7.6 x 10 x 17 cm fluid collection around gallbladder fossa with bubbles of air within the collection concerning for abscess or Biloma.  Status post cholecystectomy on April 16, 2018.  Status post EUS and ERCP with sphincterotomy and CBD stone removal on April 22, 2018. -Respiratory failure.  Intubated.  Possible left lower lobe pneumonia. -Sepsis. -History of atrial fibrillation.  Elevated INR.  Last dose of Eliquis on March 16.  Recommendations ------------------------ -Drain output has decreased this morning.  LFTs remains stable with total bilirubin of 1.6 and mild elevation of alkaline phosphatase- 139.   Patient had ERCP and sphincterotomy on April 22, 2018 which would have created pathway of least resistance for bile flow  - Follow HIDA scan   -No plan for ERCP today.  Follow clinical course.  Continue antibiotics.  Continue to hold Eliquis for now.   - I will discuss with Dr. Therisa Doyne regarding need for ERCP with possible stent placement  -GI will follow.   LOS: 2 days   Otis Brace  MD, FACP 04/28/2018, 9:58 AM  Contact #  [phone]

## 2018-04-28 NOTE — Progress Notes (Addendum)
Subjective/Chief Complaint: Underwent percutaneous yesterday evening, output bilious   Objective: Vital signs in last 24 hours: Temp:  [98.5 F (36.9 C)-100.4 F (38 C)] 98.5 F (36.9 C) (03/18 0401) Pulse Rate:  [56-123] 108 (03/18 0700) Resp:  [20-49] 22 (03/18 0700) BP: (83-141)/(49-74) 104/56 (03/18 0700) SpO2:  [79 %-100 %] 98 % (03/18 0700) FiO2 (%):  [50 %-100 %] 50 % (03/18 0326) Weight:  [80.2 kg] 80.2 kg (03/18 0415) Last BM Date: 04/26/18  Intake/Output from previous day: 03/17 0701 - 03/18 0700 In: 4943.5 [I.V.:2674; Blood:1881.8; IV Piggyback:377.8] Out: 575 [Drains:575] Intake/Output this shift: No intake/output data recorded.  General appearance: ill-appearing Resp: on vent, peak pressure 28 Cardio: mildly tachycardic, regular GI: soft, remains distended, very tender over the bladder and still tender in the right abdomen around the drain but otherwise seems nontender. He has a condom cath with minimal output and rising creatinine without rising BUN Extremities: no deformity Skin: Skin color, texture, turgor normal. No rashes or lesions Neurologic: Mental status: Alert, oriented, thought content appropriate, sedated, on 325 an hour of fentanyl Incision/Wound: incisions are all healing without sequelae. Right upper quadrant drain with bilious output  Lab Results:  Recent Labs    04/27/18 1319 04/27/18 1657 04/28/18 0500  WBC 19.2*  --  16.0*  HGB 10.6* 9.5* 7.3*  HCT 31.6* 28.0* 22.8*  PLT 365  --  303   BMET Recent Labs    04/27/18 1319 04/27/18 1657 04/28/18 0500  NA 139 140 142  K 4.3 3.9 3.7  CL 108  --  110  CO2 19*  --  22  GLUCOSE 120*  --  105*  BUN 30*  --  39*  CREATININE 2.60*  --  2.82*  CALCIUM 7.2*  --  7.4*   PT/INR Recent Labs    04/27/18 1725 04/28/18 0500  LABPROT 20.1* 23.5*  INR 1.7* 2.1*   ABG Recent Labs    04/27/18 1315 04/27/18 1657  PHART 7.422 7.403  HCO3 19.1* 20.7    Studies/Results: Ct  Abdomen Pelvis Wo Contrast  Result Date: 04/26/2018 CLINICAL DATA:  Gallbladder removed Friday. Nonproductive cough. Elevated temperature. Abdominal pain. EXAM: CT ABDOMEN AND PELVIS WITHOUT CONTRAST TECHNIQUE: Multidetector CT imaging of the abdomen and pelvis was performed following the standard protocol without IV contrast. COMPARISON:  CT abdomen 04/15/2018 FINDINGS: Lower chest: Bilateral small pleural effusions. Left lower lobe airspace disease likely reflecting atelectasis. Hepatobiliary: Normal liver. No intrahepatic biliary ductal dilatation. No hepatic mass. Interval cholecystectomy. 7.6 x 10 x 17 cm fluid collection emanating from the gallbladder fossa and tracking posteriorly with a few bubbles of air within the collection which may reflect a biloma or abscess. Mild inflammatory changes along the inferior margin of the liver. Small amount of perihepatic fluid. Pancreas: Unremarkable. No pancreatic ductal dilatation or surrounding inflammatory changes. Spleen: Normal in size without focal abnormality. Adrenals/Urinary Tract: Adrenal glands are unremarkable. Kidneys are normal, without renal calculi, focal lesion, or hydronephrosis. Bladder is unremarkable. Stomach/Bowel: Stomach is within normal limits. Appendix appears normal. No evidence of bowel wall thickening, distention, or inflammatory changes. Diverticulosis without evidence of diverticulitis. Vascular/Lymphatic: Normal caliber abdominal aorta with atherosclerosis. No lymphadenopathy. Reproductive: Prostate is unremarkable. Other: No fluid collection or hematoma. Small amount of pelvic free fluid. Musculoskeletal: No acute osseous abnormality. No aggressive osseous lesion. Chronic compression fracture of L1 vertebral body. Diffuse thoracolumbar spondylosis. IMPRESSION: 1. Interval cholecystectomy. 7.6 x 10 x 17 cm fluid collection emanating from the gallbladder fossa and tracking  posteriorly with a few bubbles of air within the collection which  may reflect a biloma versus abscess. Electronically Signed   By: Elige Ko   On: 04/26/2018 17:29   Dg Chest 2 View  Result Date: 04/26/2018 CLINICAL DATA:  85 year old male with cough.  Initial encounter. EXAM: CHEST - 2 VIEW COMPARISON:  12/30/2017 CT.  12/23/2017 chest x-ray. FINDINGS: Cardiomegaly.  Pulmonary vascular congestion. Left lower lobe consolidation with pleural effusion. Left lower lobe mass as noted on prior CT of indeterminate etiology. Right lower lobe nodule and scattered granulomas better delineated on prior CT. Calcified aorta. No obvious acute osseous abnormality although evaluation limited. Prior compression fracture L1. IMPRESSION: 1. Left lower lobe consolidation with pleural effusion. 2. Left lower lobe mass as noted on prior CT of indeterminate etiology. 3. Cardiomegaly.  Pulmonary vascular congestion. 4. Right lower lobe nodule and scattered granulomas better delineated on prior CT. 5.  Aortic Atherosclerosis (ICD10-I70.0). Electronically Signed   By: Lacy Duverney M.D.   On: 04/26/2018 18:11   Dg Chest Port 1 View  Result Date: 04/28/2018 CLINICAL DATA:  Hypoxia EXAM: PORTABLE CHEST 1 VIEW COMPARISON:  April 27, 2018 FINDINGS: Endotracheal tube tip is 4.4 cm above the carina. Central catheter tip is in the superior vena cava near the cavoatrial junction. Nasogastric tube tip and side port are below the diaphragm. No pneumothorax. There is persistent left lower lobe airspace consolidation with small left pleural effusion. The right lung is clear. There is cardiomegaly with pulmonary vascularity normal. No adenopathy. There is aortic atherosclerosis. There is degenerative change in each shoulder. IMPRESSION: Tube and catheter positions as described without pneumothorax. Persistent left lower lobe consolidation, likely pneumonia, with small left pleural effusion. Right lung clear. Stable cardiac prominence. Aortic Atherosclerosis (ICD10-I70.0). Electronically Signed   By: Bretta Bang III M.D.   On: 04/28/2018 07:08   Dg Chest Port 1 View  Result Date: 04/27/2018 CLINICAL DATA:  Respiratory failure EXAM: PORTABLE CHEST 1 VIEW COMPARISON:  Film from earlier in the same day. FINDINGS: Cardiac shadow remains enlarged. Endotracheal tube is now seen with the tip approximately 1.4 cm deep within the right mainstem bronchus. This should be withdrawn several cm. Gastric catheter is noted within the stomach. Left jugular central line is seen with the tip in the distal superior vena cava. No pneumothorax is noted. Increasing left basilar atelectasis is noted. IMPRESSION: Tubes and lines as described above. The endotracheal tube should be withdrawn approximately 3 cm. Increasing left basilar consolidation. These results will be called to the ordering clinician or representative by the Radiologist Assistant, and communication documented in the PACS or zVision Dashboard. Electronically Signed   By: Alcide Clever M.D.   On: 04/27/2018 17:04   Dg Chest Port 1 View  Result Date: 04/27/2018 CLINICAL DATA:  85 year old male with shortness of breath, tachypnea. EXAM: PORTABLE CHEST 1 VIEW COMPARISON:  Chest radiographs yesterday, and November 2019. CT Abdomen and Pelvis yesterday. FINDINGS: Portable AP semi upright view at 1335 hours. Continued low lung volumes. Stable cardiac size and mediastinal contours. Visualized tracheal air column is within normal limits. Continued bilateral increased pulmonary interstitial markings and confluent retrocardiac opacity. No pneumothorax. Continued small pleural effusions suspected. Negative visible bowel gas pattern. IMPRESSION: Continued low lung volumes with left lower lobe consolidation suspicious for Pneumonia and small pleural effusions. Electronically Signed   By: Odessa Fleming M.D.   On: 04/27/2018 13:55   Dg Abd Portable 1v  Result Date: 04/27/2018 CLINICAL DATA:  Abdominal  pain EXAM: PORTABLE ABDOMEN - 1 VIEW COMPARISON:  04/26/2018 FINDINGS: Scattered  large and small bowel gas is noted. No obstructive changes are seen. Gastric catheter is noted within the stomach. No acute bony abnormality is seen. IMPRESSION: No acute abnormality is noted. Electronically Signed   By: Alcide Clever M.D.   On: 04/27/2018 17:02    Anti-infectives: Anti-infectives (From admission, onward)   Start     Dose/Rate Route Frequency Ordered Stop   04/27/18 1800  vancomycin (VANCOCIN) IVPB 750 mg/150 ml premix  Status:  Discontinued     750 mg 150 mL/hr over 60 Minutes Intravenous Every 24 hours 04/26/18 1634 04/27/18 0730   04/27/18 1600  ceFEPIme (MAXIPIME) 2 g in sodium chloride 0.9 % 100 mL IVPB  Status:  Discontinued     2 g 200 mL/hr over 30 Minutes Intravenous Every 24 hours 04/26/18 1629 04/27/18 1412   04/27/18 1600  ceFEPIme (MAXIPIME) 1 g in sodium chloride 0.9 % 100 mL IVPB     1 g 200 mL/hr over 30 Minutes Intravenous Every 24 hours 04/27/18 1412     04/26/18 2359  metroNIDAZOLE (FLAGYL) IVPB 500 mg     500 mg 100 mL/hr over 60 Minutes Intravenous Every 8 hours 04/26/18 2325     04/26/18 1700  vancomycin (VANCOCIN) 1,500 mg in sodium chloride 0.9 % 500 mL IVPB     1,500 mg 250 mL/hr over 120 Minutes Intravenous  Once 04/26/18 1557 04/26/18 2059   04/26/18 1600  ceFEPIme (MAXIPIME) 2 g in sodium chloride 0.9 % 100 mL IVPB     2 g 200 mL/hr over 30 Minutes Intravenous  Once 04/26/18 1553 04/26/18 1650   04/26/18 1600  metroNIDAZOLE (FLAGYL) IVPB 500 mg     500 mg 100 mL/hr over 60 Minutes Intravenous  Once 04/26/18 1553 04/26/18 1830   04/26/18 1600  vancomycin (VANCOCIN) IVPB 1000 mg/200 mL premix  Status:  Discontinued     1,000 mg 200 mL/hr over 60 Minutes Intravenous  Once 04/26/18 1553 04/26/18 1557      Assessment/Plan: Status post laparoscopic cholecystectomy 04/16/2018 by Dr. Corliss Skains Gallbladder fossa abscess- IV ABX,drain inserted with bilious output. He may need GI consult for repeat ERCP   PNA, Acute kidney injury, elevated troponin-  per primary. Respiratory failure requiring intubation likely secondary to the pneumonia. Troponins are downtrending. Pressor requirement is down to 4 Levothroid. Acute kidney injury is worsening- BUN and creatinine ratio suggestive of intrarenal or post renal obstruction. Coagulopathy: likely more reflective of liver dysfunction then medication effect as he was on Eliquis  I have put an order in for Foley catheter as I suspect he is in urinary retention given tenderness over the bladder and I think this is indicated regardless for urine output monitoring given his rising creatinine.   LOS: 1 day   LOS: 2 days    Berna Bue 04/28/2018

## 2018-04-29 ENCOUNTER — Inpatient Hospital Stay (HOSPITAL_COMMUNITY): Payer: Medicare Other

## 2018-04-29 LAB — HEPATIC FUNCTION PANEL
ALT: 45 U/L — ABNORMAL HIGH (ref 0–44)
AST: 89 U/L — ABNORMAL HIGH (ref 15–41)
Albumin: 1.8 g/dL — ABNORMAL LOW (ref 3.5–5.0)
Alkaline Phosphatase: 84 U/L (ref 38–126)
Bilirubin, Direct: 0.4 mg/dL — ABNORMAL HIGH (ref 0.0–0.2)
Indirect Bilirubin: 1 mg/dL — ABNORMAL HIGH (ref 0.3–0.9)
Total Bilirubin: 1.4 mg/dL — ABNORMAL HIGH (ref 0.3–1.2)
Total Protein: 5.1 g/dL — ABNORMAL LOW (ref 6.5–8.1)

## 2018-04-29 LAB — BASIC METABOLIC PANEL
Anion gap: 10 (ref 5–15)
BUN: 46 mg/dL — ABNORMAL HIGH (ref 8–23)
CALCIUM: 7 mg/dL — AB (ref 8.9–10.3)
CO2: 16 mmol/L — ABNORMAL LOW (ref 22–32)
Chloride: 115 mmol/L — ABNORMAL HIGH (ref 98–111)
Creatinine, Ser: 2.86 mg/dL — ABNORMAL HIGH (ref 0.61–1.24)
GFR calc Af Amer: 21 mL/min — ABNORMAL LOW (ref >60)
GFR calc non Af Amer: 19 mL/min — ABNORMAL LOW (ref >60)
Glucose, Bld: 93 mg/dL (ref 70–99)
Potassium: 4.7 mmol/L (ref 3.5–5.1)
Sodium: 141 mmol/L (ref 135–145)

## 2018-04-29 LAB — CBC WITH DIFFERENTIAL/PLATELET
Abs Immature Granulocytes: 0.13 10*3/uL — ABNORMAL HIGH (ref 0.00–0.07)
Basophils Absolute: 0 10*3/uL (ref 0.0–0.1)
Basophils Relative: 0 %
EOS PCT: 0 %
Eosinophils Absolute: 0.1 10*3/uL (ref 0.0–0.5)
HCT: 24.8 % — ABNORMAL LOW (ref 39.0–52.0)
Hemoglobin: 7.5 g/dL — ABNORMAL LOW (ref 13.0–17.0)
Immature Granulocytes: 1 %
LYMPHS PCT: 8 %
Lymphs Abs: 1.2 10*3/uL (ref 0.7–4.0)
MCH: 29.3 pg (ref 26.0–34.0)
MCHC: 30.2 g/dL (ref 30.0–36.0)
MCV: 96.9 fL (ref 80.0–100.0)
Monocytes Absolute: 0.8 10*3/uL (ref 0.1–1.0)
Monocytes Relative: 5 %
Neutro Abs: 13.3 10*3/uL — ABNORMAL HIGH (ref 1.7–7.7)
Neutrophils Relative %: 86 %
Platelets: 311 10*3/uL (ref 150–400)
RBC: 2.56 MIL/uL — ABNORMAL LOW (ref 4.22–5.81)
RDW: 13.5 % (ref 11.5–15.5)
WBC: 15.4 10*3/uL — ABNORMAL HIGH (ref 4.0–10.5)
nRBC: 0 % (ref 0.0–0.2)

## 2018-04-29 LAB — GLUCOSE, CAPILLARY
GLUCOSE-CAPILLARY: 101 mg/dL — AB (ref 70–99)
Glucose-Capillary: 85 mg/dL (ref 70–99)
Glucose-Capillary: 85 mg/dL (ref 70–99)
Glucose-Capillary: 90 mg/dL (ref 70–99)
Glucose-Capillary: 112 mg/dL — ABNORMAL HIGH (ref 70–99)

## 2018-04-29 LAB — PROTIME-INR
INR: 1.7 — ABNORMAL HIGH (ref 0.8–1.2)
Prothrombin Time: 19.8 seconds — ABNORMAL HIGH (ref 11.4–15.2)

## 2018-04-29 LAB — PHOSPHORUS: Phosphorus: 2.5 mg/dL (ref 2.5–4.6)

## 2018-04-29 LAB — LACTIC ACID, PLASMA: Lactic Acid, Venous: 1.2 mmol/L (ref 0.5–1.9)

## 2018-04-29 LAB — LEGIONELLA PNEUMOPHILA SEROGP 1 UR AG: L. pneumophila Serogp 1 Ur Ag: NEGATIVE

## 2018-04-29 LAB — MAGNESIUM: Magnesium: 2.2 mg/dL (ref 1.7–2.4)

## 2018-04-29 MED ORDER — VITAMIN K1 10 MG/ML IJ SOLN
5.0000 mg | Freq: Once | INTRAVENOUS | Status: AC
  Administered 2018-04-29: 5 mg via INTRAVENOUS
  Filled 2018-04-29: qty 0.5

## 2018-04-29 MED ORDER — LACTATED RINGERS IV BOLUS
1000.0000 mL | Freq: Once | INTRAVENOUS | Status: AC
  Administered 2018-04-29: 1000 mL via INTRAVENOUS

## 2018-04-29 MED ORDER — DEXTROSE IN LACTATED RINGERS 5 % IV SOLN
INTRAVENOUS | Status: DC
  Administered 2018-04-29 – 2018-05-01 (×4): via INTRAVENOUS

## 2018-04-29 NOTE — Progress Notes (Signed)
Pharmacy Antibiotic Note  Vincent Barrett is a 85 y.o. male admitted on 04/26/2018 with an intra-abdominal abscess. Biloma drain placed 3/17. Pharmacy has been consulted for cefepime dosing.   HIDA scan unclear about source of bile leak, but drain output down to 260 mL overnight. Tmax 100.7 and WBC down to 15.4. Procalcitonin from yesterday was 46.32, lactate down to 1.2. Renal function essentially unchanged from yesterday. Abscess culture growing moderate serratia marcescens. Will continue cefepime/flagyl until sensitivities result.  Plan: Cefepime 1g IV every 24 hours. Flagyl 500 mg IV every 8 hours per MD Monitor labs, c/s, and de-escalation of antibiotics  Height: 5\' 6"  (167.6 cm) Weight: 186 lb 1.1 oz (84.4 kg) IBW/kg (Calculated) : 63.8  Temp (24hrs), Avg:99.4 F (37.4 C), Min:98.9 F (37.2 C), Max:100.7 F (38.2 C)  Recent Labs  Lab 04/26/18 1508 04/26/18 1727 04/27/18 0336 04/27/18 1319 04/27/18 1725 04/28/18 0143 04/28/18 0500 04/28/18 2345 04/29/18 0501  WBC 19.9*  --  13.4* 19.2*  --   --  16.0*  --  15.4*  CREATININE 1.42*  --  1.84* 2.60*  --   --  2.82* 2.86*  --   LATICACIDVEN 1.5 1.0  --  4.2* 3.6* 1.6  --  1.2  --     Estimated Creatinine Clearance: 17.5 mL/min (A) (by C-G formula based on SCr of 2.86 mg/dL (H)).    Allergies  Allergen Reactions  . Quinine Nausea And Vomiting    Antimicrobials this admission: Vanco 3/16 >>3/17  Cefepime 3/16 >>  Flagyl 3/16 >>  Dose adjustments this admission: Cefepime 2 g IV q24h >> 1 g IV q24h  Microbiology results: 3/17 abscess cx: mod serratia marcescens 3/17 MRSA PCR: neg 3/16 Strep pneumo urine: neg 3/16 Legionella: in process 3/16BCx: ngtd 3/16UCx: insignificant growth 3/16 Influenza negative  Thank you for allowing pharmacy to be a part of this patient's care.  Arvilla Market, PharmD PGY1 Pharmacy Resident Phone [phone] 04/29/2018     8:23 AM

## 2018-04-29 NOTE — H&P (View-Only) (Signed)
[facility]astroenterology Progress Note  Jeffrie Dicola 85 y.o. 1925/05/31  CC: Bile leak   Subjective: No acute events overnight.  Drain out decreased but still around 260 cc output over 24 hours.  ROS : Not able to obtain   Objective: Vital signs in last 24 hours: Vitals:   04/29/18 0930 04/29/18 1000  BP: (!) 103/55 108/62  Pulse: (!) 106 (!) 104  Resp: 15 15  Temp:    SpO2: 96% 96%    Physical Exam: General.  Currently intubated. HEENT : ETT tube in place. OG tube in place  Lungs.    Coarse breath sounds Currently on ventilator.  Anterior exam only. Heart.  Rate rhythm regular.  No murmur ABD :   Abdomen distended with generalized tenderness to palpation.  Right upper quadrant drain noted.   Bowel sounds present. LE : trace edema  Neuro : sedated   Lab Results: Recent Labs    04/28/18 0500 04/28/18 2345 04/29/18 0501  NA 142 141  --   K 3.7 4.7  --   CL 110 115*  --   CO2 22 16*  --   GLUCOSE 105* 93  --   BUN 39* 46*  --   CREATININE 2.82* 2.86*  --   CALCIUM 7.4* 7.0*  --   MG 1.6*  --  2.2  PHOS  --   --  2.5   Recent Labs    04/28/18 0500 04/29/18 0501  AST 44* 89*  ALT 36 45*  ALKPHOS 139* 84  BILITOT 1.6* 1.4*  PROT 5.4* 5.1*  ALBUMIN 2.0* 1.8*   Recent Labs    04/28/18 0500 04/29/18 0501  WBC 16.0* 15.4*  NEUTROABS 14.4* 13.3*  HGB 7.3* 7.5*  HCT 22.8* 24.8*  MCV 93.8 96.9  PLT 303 311   Recent Labs    04/28/18 0500 04/29/18 0501  LABPROT 23.5* 19.8*  INR 2.1* 1.7*      Assessment/Plan: Bile leak. 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   large 7.6 x 10 x 17 cm fluid collection around gallbladder fossa with bubbles of air within the collection concerning for abscess or Biloma.  Status post cholecystectomy on April 16, 2018.  Status post EUS and ERCP with sphincterotomy and CBD stone removal on April 22, 2018.  -Respiratory failure.  Intubated.  Possible left  lower lobe pneumonia.  -Sepsis.  -History of atrial fibrillation.  Elevated INR.  Last dose of Eliquis on March 16.  Recommendations ------------------------ - D/W Dr. Marca Ancona and Dr. Ewing Schlein.  Plan for ERCP tomorrow with stent placement. - Check INR in the morning.  Give 1 dose of vitamin K. -Continue supportive care. D/W Dr. Marchelle Gearing  -Procedure discussed in detail with wife  over the phone.  Consent obtained over the phone.  Risk benefits and alternative also discussed.    Kathi Der MD, FACP 04/29/2018, 10:30 AM  Contact #  [phone]

## 2018-04-29 NOTE — Progress Notes (Signed)
Subjective/Chief Complaint: No acute change. Weaning vent. Off pressors. HIDA not specific about source of bile leak; drain output remains bilious about 260cc   Objective: Vital signs in last 24 hours: Temp:  [98.7 F (37.1 C)-100.7 F (38.2 C)] 98.7 F (37.1 C) (03/19 0800) Pulse Rate:  [67-112] 80 (03/19 0748) Resp:  [20-23] 22 (03/19 0748) BP: (87-118)/(48-78) 96/59 (03/19 0730) SpO2:  [92 %-100 %] 98 % (03/19 0748) FiO2 (%):  [30 %-40 %] 30 % (03/19 0400) Weight:  [84.4 kg] 84.4 kg (03/19 0245) Last BM Date: 04/27/18  Intake/Output from previous day: 03/18 0701 - 03/19 0700 In: 5842.2 [I.V.:3411.5; IV Piggyback:2420.7] Out: 905 [Urine:595; Emesis/NG output:50; Drains:260] Intake/Output this shift: No intake/output data recorded.  General appearance: ill-appearing Resp: on vent, PSV Cardio: mildly tachycardic, regular GI: soft, remains distended, tender over central and right abdomen Extremities: no deformity Skin: Skin color, texture, turgor normal. No rashes or lesions Neurologic: fentanyl gtt Incision/Wound: incisions are all healing without sequelae. Right upper quadrant drain with bilious output  Lab Results:  Recent Labs    04/28/18 0500 04/29/18 0501  WBC 16.0* 15.4*  HGB 7.3* 7.5*  HCT 22.8* 24.8*  PLT 303 311   BMET Recent Labs    04/28/18 0500 04/28/18 2345  NA 142 141  K 3.7 4.7  CL 110 115*  CO2 22 16*  GLUCOSE 105* 93  BUN 39* 46*  CREATININE 2.82* 2.86*  CALCIUM 7.4* 7.0*   PT/INR Recent Labs    04/28/18 0500 04/29/18 0501  LABPROT 23.5* 19.8*  INR 2.1* 1.7*   ABG Recent Labs    04/27/18 1315 04/27/18 1657  PHART 7.422 7.403  HCO3 19.1* 20.7    Studies/Results: Dg Chest Port 1 View  Result Date: 04/29/2018 CLINICAL DATA:  Intubation. EXAM: PORTABLE CHEST 1 VIEW COMPARISON:  04/28/2018. FINDINGS: Endotracheal tube, left IJ line, NG tube in stable position. Cardiomegaly. Left base infiltrate with small left pleural  effusion. Interim progression from prior exam. No pneumothorax. IMPRESSION: 1.  Lines and tubes in stable position. 2. Left base infiltrate consistent with pneumonia. Left-sided pleural effusion. Interim progression of findings from prior exam. Electronically Signed   By: Maisie Fus  Register   On: 04/29/2018 06:09   Dg Chest Port 1 View  Result Date: 04/28/2018 CLINICAL DATA:  Hypoxia EXAM: PORTABLE CHEST 1 VIEW COMPARISON:  April 27, 2018 FINDINGS: Endotracheal tube tip is 4.4 cm above the carina. Central catheter tip is in the superior vena cava near the cavoatrial junction. Nasogastric tube tip and side port are below the diaphragm. No pneumothorax. There is persistent left lower lobe airspace consolidation with small left pleural effusion. The right lung is clear. There is cardiomegaly with pulmonary vascularity normal. No adenopathy. There is aortic atherosclerosis. There is degenerative change in each shoulder. IMPRESSION: Tube and catheter positions as described without pneumothorax. Persistent left lower lobe consolidation, likely pneumonia, with small left pleural effusion. Right lung clear. Stable cardiac prominence. Aortic Atherosclerosis (ICD10-I70.0). Electronically Signed   By: Bretta Bang III M.D.   On: 04/28/2018 07:08   Dg Chest Port 1 View  Result Date: 04/27/2018 CLINICAL DATA:  Respiratory failure EXAM: PORTABLE CHEST 1 VIEW COMPARISON:  Film from earlier in the same day. FINDINGS: Cardiac shadow remains enlarged. Endotracheal tube is now seen with the tip approximately 1.4 cm deep within the right mainstem bronchus. This should be withdrawn several cm. Gastric catheter is noted within the stomach. Left jugular central line is seen with the tip in  the distal superior vena cava. No pneumothorax is noted. Increasing left basilar atelectasis is noted. IMPRESSION: Tubes and lines as described above. The endotracheal tube should be withdrawn approximately 3 cm. Increasing left basilar  consolidation. These results will be called to the ordering clinician or representative by the Radiologist Assistant, and communication documented in the PACS or zVision Dashboard. Electronically Signed   By: Alcide Clever M.D.   On: 04/27/2018 17:04   Dg Chest Port 1 View  Result Date: 04/27/2018 CLINICAL DATA:  85 year old male with shortness of breath, tachypnea. EXAM: PORTABLE CHEST 1 VIEW COMPARISON:  Chest radiographs yesterday, and November 2019. CT Abdomen and Pelvis yesterday. FINDINGS: Portable AP semi upright view at 1335 hours. Continued low lung volumes. Stable cardiac size and mediastinal contours. Visualized tracheal air column is within normal limits. Continued bilateral increased pulmonary interstitial markings and confluent retrocardiac opacity. No pneumothorax. Continued small pleural effusions suspected. Negative visible bowel gas pattern. IMPRESSION: Continued low lung volumes with left lower lobe consolidation suspicious for Pneumonia and small pleural effusions. Electronically Signed   By: Odessa Fleming M.D.   On: 04/27/2018 13:55   Dg Abd Portable 1v  Result Date: 04/27/2018 CLINICAL DATA:  Abdominal pain EXAM: PORTABLE ABDOMEN - 1 VIEW COMPARISON:  04/26/2018 FINDINGS: Scattered large and small bowel gas is noted. No obstructive changes are seen. Gastric catheter is noted within the stomach. No acute bony abnormality is seen. IMPRESSION: No acute abnormality is noted. Electronically Signed   By: Alcide Clever M.D.   On: 04/27/2018 17:02   Ct Image Guided Fluid Drain By Catheter  Result Date: 04/28/2018 CLINICAL DATA:  Subhepatic collection post cholecystectomy, hemodynamic instability, drainage requested EXAM: CT GUIDED DRAINAGE OF PERITONEAL ABSCESS ANESTHESIA/SEDATION: lidocaine 1% subcutaneous PROCEDURE: The procedure, risks, benefits, and alternatives were explained to the patient. Questions regarding the procedure were encouraged and answered. The patient understands and consents to  the procedure. Select axial scans through the upper abdomen obtained. The collection was localized and an appropriate skin entry site was determined and marked. The operative field was prepped with chlorhexidinein a sterile fashion, and a sterile drape was applied covering the operative field. A sterile gown and sterile gloves were used for the procedure. Local anesthesia was provided with 1% Lidocaine. Under CT fluoroscopic guidance, 18 gauge percutaneous entry needle advanced into the collection. Bilious fluid returned. Amplatz wire advanced easily, position confirmed on CT. Tract dilated to facilitate placement of a 12 French pigtail drain catheter, placed within the central dependent aspect of the collection. Catheter secured externally with 0 Prolene suture and StatLock and placed to gravity drain bag. 30 mL aspirate was sent for Gram stain and culture. The patient tolerated the procedure well. COMPLICATIONS: None immediate FINDINGS: Subhepatic fluid collection was localized. 12 French drain catheter placed as above. Sample of the bilious aspirate sent for Gram stain and culture. IMPRESSION: 1. Technically successful CT-guided right upper quadrant abscess drain catheter placement. Electronically Signed   By: Corlis Leak M.D.   On: 04/28/2018 09:13   Nm Hepatobiliary Including Gb  Result Date: 04/28/2018 CLINICAL DATA:  85 year old post laparoscopic cholecystectomy 04/16/2018 followed by ERCP with biliary sphincterotomy. Abdominal pain, fever and subhepatic fluid collection on CT. Evaluate for biliary leak. EXAM: NUCLEAR MEDICINE HEPATOBILIARY IMAGING TECHNIQUE: Sequential images of the abdomen were obtained out to 60 minutes following intravenous administration of radiopharmaceutical. RADIOPHARMACEUTICALS:  5.3 mCi Tc-51m  Choletec IV COMPARISON:  Abdominopelvic CT 04/26/2018. FINDINGS: There is prompt uptake in biliary excretion of activity. There  is prompt drainage into the biliary system and small bowel.  There is progressive accumulation of activity in the subhepatic space, corresponding with the fluid collection on recent CT and consistent with a contained bile leak. No free peritoneal activity identified. IMPRESSION: Progressive accumulation of activity in the subhepatic space consistent with a contained bile leak. No evidence of biliary obstruction. Electronically Signed   By: Carey Bullocks M.D.   On: 04/28/2018 16:04    Anti-infectives: Anti-infectives (From admission, onward)   Start     Dose/Rate Route Frequency Ordered Stop   04/27/18 1800  vancomycin (VANCOCIN) IVPB 750 mg/150 ml premix  Status:  Discontinued     750 mg 150 mL/hr over 60 Minutes Intravenous Every 24 hours 04/26/18 1634 04/27/18 0730   04/27/18 1600  ceFEPIme (MAXIPIME) 2 g in sodium chloride 0.9 % 100 mL IVPB  Status:  Discontinued     2 g 200 mL/hr over 30 Minutes Intravenous Every 24 hours 04/26/18 1629 04/27/18 1412   04/27/18 1600  ceFEPIme (MAXIPIME) 1 g in sodium chloride 0.9 % 100 mL IVPB     1 g 200 mL/hr over 30 Minutes Intravenous Every 24 hours 04/27/18 1412     04/26/18 2359  metroNIDAZOLE (FLAGYL) IVPB 500 mg     500 mg 100 mL/hr over 60 Minutes Intravenous Every 8 hours 04/26/18 2325     04/26/18 1700  vancomycin (VANCOCIN) 1,500 mg in sodium chloride 0.9 % 500 mL IVPB     1,500 mg 250 mL/hr over 120 Minutes Intravenous  Once 04/26/18 1557 04/26/18 2059   04/26/18 1600  ceFEPIme (MAXIPIME) 2 g in sodium chloride 0.9 % 100 mL IVPB     2 g 200 mL/hr over 30 Minutes Intravenous  Once 04/26/18 1553 04/26/18 1650   04/26/18 1600  metroNIDAZOLE (FLAGYL) IVPB 500 mg     500 mg 100 mL/hr over 60 Minutes Intravenous  Once 04/26/18 1553 04/26/18 1830   04/26/18 1600  vancomycin (VANCOCIN) IVPB 1000 mg/200 mL premix  Status:  Discontinued     1,000 mg 200 mL/hr over 60 Minutes Intravenous  Once 04/26/18 1553 04/26/18 1557      Assessment/Plan: Status post laparoscopic cholecystectomy 04/16/2018 by Dr.  Corliss Skains Gallbladder fossa abscess- IV ABX,drain inserted with bilious output. GI following   PNA, Acute kidney injury, elevated troponin- per primary. Respiratory failure likely secondary to pneumonia.  Coagulopathy: likely more reflective of liver dysfunction then medication effect as he was on Eliquis  Continue supportive care, antibiotics. 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    LOS: 1 day   LOS: 3 days    Vincent Barrett 04/29/2018

## 2018-04-29 NOTE — Progress Notes (Signed)
Pt placed back on full vent support to rest. No plan to extubate at this time. RN notified.

## 2018-04-29 NOTE — Progress Notes (Signed)
As ordered from Dr Marchelle Gearing, daughter brought 2 hearing aids, charging case and charging cord. Case labeled with pt stickers. Daughter understands that the hospital is not liable if lost, but agreed to leave them to help her father. Vincent Barrett Vincent Barrett

## 2018-04-29 NOTE — Progress Notes (Signed)
Nutrition Follow-up  DOCUMENTATION CODES:   Not applicable  INTERVENTION:   If unable to initiate enteral nutrition post ERCP within 24-48 hours, recommend considering initiation of TPN  TF recommendations when medically appropriate Vital AF 1.2 at 60 ml/hr Provides 1728 kcals, 108 g of protein and 1166 mL of free water Meets 100% estimated calorie and protien needs  NUTRITION DIAGNOSIS:   Inadequate oral intake related to acute illness as evidenced by NPO status.  GOAL:   Patient will meet greater than or equal to 90% of their needs  MONITOR:   Vent status, Labs, Weight trends, Skin  REASON FOR ASSESSMENT:   Malnutrition Screening Tool, Ventilator    ASSESSMENT:    85 yo male admitted with abdominal abscess/biloma, pneumonia, LLL lung mass. Pt with recent admission from 04/14/18 to 04/23/18 for acute calculus cholecystitis and choledocholithiasis and underwent ERCP with sphinerotomy/CBD stone removal. PMH includes COPD, HTN  3/06 Lap cholecystectomy 3/12 ERCP with sphinerotomy and CBD stone removal 3/13 Discharge 3/16 Admit 3/17 RUQ biliary drain, Intubation 3/18 HIDA scan revealed bile leak at subhepatic space  NPO/CL day 3. No family at bedside so unable to obtain diet and weight history. Given recent prolonged admission and re-admission pt likely with poor po intake recently  Drain output bilious 260 mL Plan ERCP tomorrow with stent placement  Patient is currently intubated on ventilator support MV: 6.7 L/min Temp (24hrs), Avg:99.2 F (37.3 C), Min:97.8 F (36.6 C), Max:100.7 F (38.2 C)  Abdomen very taut/firm, NG tube with minimal output, dark  Current wt 84.4 kg; admit weight 79.5 kg. Net + 11 L per I/O flow sheet  Unable to diagnose malnutrition based on current information available. Pt is however at HIGH nutritional risk. Pt very edematous on exam and NFPE limited. Reassess as able on follow-up   Labs: Creatinine 2.86, BUN 46 Meds: D5 LR at 75  ml/hr  NUTRITION - FOCUSED PHYSICAL EXAM:    Most Recent Value  Orbital Region  Mild depletion  Upper Arm Region  Unable to assess  Thoracic and Lumbar Region  Unable to assess  Buccal Region  Unable to assess  Temple Region  Moderate depletion  Clavicle Bone Region  Mild depletion  Clavicle and Acromion Bone Region  Mild depletion  Scapular Bone Region  No depletion  Dorsal Hand  Unable to assess  Patellar Region  Unable to assess  Anterior Thigh Region  Unable to assess  Posterior Calf Region  Unable to assess  Edema (RD Assessment)  Moderate       Diet Order:   Diet Order            Diet NPO time specified  Diet effective midnight        Diet NPO time specified Except for: Sips with Meds  Diet effective midnight              EDUCATION NEEDS:   Not appropriate for education at this time  Skin:  Skin Assessment: Reviewed RN Assessment  Last BM:  3/17  Height:   Ht Readings from Last 1 Encounters:  04/27/18 5\' 6"  (1.676 m)    Weight:   Wt Readings from Last 1 Encounters:  04/29/18 84.4 kg    Ideal Body Weight:     BMI:  Body mass index is 30.03 kg/m.  Estimated Nutritional Needs:   Kcal:  1723 kcals   Protein:  100-125 g  Fluid:  >/= 1.7 L   Romelle Starcher MS, RD, LDN, CNSC [phone])  [phone] Pager  [phone] Weekend/On-Call Pager

## 2018-04-29 NOTE — Progress Notes (Addendum)
[facility] Progress Note  Vincent Barrett 85 y.o. 03/22/1925  CC: Bile leak   Subjective: No acute events overnight.  Drain out decreased but still around 260 cc output over 24 hours.  ROS : Not able to obtain   Objective: Vital signs in last 24 hours: Vitals:   04/29/18 0930 04/29/18 1000  BP: (!) 103/55 108/62  Pulse: (!) 106 (!) 104  Resp: 15 15  Temp:    SpO2: 96% 96%    Physical Exam: General.  Currently intubated. HEENT : ETT tube in place. OG tube in place  Lungs.    Coarse breath sounds Currently on ventilator.  Anterior exam only. Heart.  Rate rhythm regular.  No murmur ABD :   Abdomen distended with generalized tenderness to palpation.  Right upper quadrant drain noted.   Bowel sounds present. LE : trace edema  Neuro : sedated   Lab Results: Recent Labs    04/28/18 0500 04/28/18 2345 04/29/18 0501  NA 142 141  --   K 3.7 4.7  --   CL 110 115*  --   CO2 22 16*  --   GLUCOSE 105* 93  --   BUN 39* 46*  --   CREATININE 2.82* 2.86*  --   CALCIUM 7.4* 7.0*  --   MG 1.6*  --  2.2  PHOS  --   --  2.5   Recent Labs    04/28/18 0500 04/29/18 0501  AST 44* 89*  ALT 36 45*  ALKPHOS 139* 84  BILITOT 1.6* 1.4*  PROT 5.4* 5.1*  ALBUMIN 2.0* 1.8*   Recent Labs    04/28/18 0500 04/29/18 0501  WBC 16.0* 15.4*  NEUTROABS 14.4* 13.3*  HGB 7.3* 7.5*  HCT 22.8* 24.8*  MCV 93.8 96.9  PLT 303 311   Recent Labs    04/28/18 0500 04/29/18 0501  LABPROT 23.5* 19.8*  INR 2.1* 1.7*      Assessment/Plan: Bile leak. 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   large 7.6 x 10 x 17 cm fluid collection around gallbladder fossa with bubbles of air within the collection concerning for abscess or Biloma.  Status post cholecystectomy on April 16, 2018.  Status post EUS and ERCP with sphincterotomy and CBD stone removal on April 22, 2018.  -Respiratory failure.  Intubated.  Possible left  lower lobe pneumonia.  -Sepsis.  -History of atrial fibrillation.  Elevated INR.  Last dose of Eliquis on March 16.  Recommendations ------------------------ - D/W Dr. Karki and Dr. Magod.  Plan for ERCP tomorrow with stent placement. - Check INR in the morning.  Give 1 dose of vitamin K. -Continue supportive care. D/W Dr. Ramaswamy  -Procedure discussed in detail with wife  over the phone.  Consent obtained over the phone.  Risk benefits and alternative also discussed.    Maecy Podgurski MD, FACP 04/29/2018, 10:30 AM  Contact #  [phone] 

## 2018-04-29 NOTE — Consult Note (Signed)
NAME:  Vincent Barrett, MRN:  [id number], DOB:  Sep 06, 1925, LOS: 3 ADMISSION DATE:  04/26/2018, CONSULTATION DATE:  04/27/2018 REFERRING MD: Hanley Ben, CHIEF COMPLAINT:  Respiratory Distress, Suspected sepsis   Brief History    85 year old male with history of paroxysmal atrial fibrillation on Eliquis, hypertension, COPD, CKD stage III, BPH, recent acute calculus cholecystitis status post laparoscopic cholecystectomy on 04/16/2018 followed by ERCP with biliary sphincterotomy and balloon extraction of the CBD stone on 04/22/2018 and subsequent discharge afterwards presented with abdominal pain and shortness of breath on 04/26/2018.  He was found to be febrile and tachycardic with significant leukocytosis.  CT abdomen and pelvis showed possible gallbladder fossa abscess versus biloma.  He was started on broad-spectrum antibiotics.  General surgery was consulted who recommended IR consult. IR planned to give Vitamin K and 4 units FFP, and evaluate INR and patient in the morning. Dr. Vassie Loll has called surgery to re-evaluate for source control at an earlier date. Pt has subsequently decompensated. He has developed respiratory distress, with RR of 48 . CXR shows low lung volumes with LLL consolidation suspicious for pneumonia, small pleural effusions. He sounds wet. Lactate is 4.2 up from 1.0 on 3/16. Max Lactate was 1.5 on 3/16 am. He will receive 4 units FFP. He is hypotensive at present, WBC has increased from 13.4 to 19.2 in the last 6 hours.Suspect this has evolved into peritonitis.Patient has been transferred to the ICU and PCCM have been consulted for management of care.Per nursing , the family does want everything done.   Past Medical History   Past Medical History:  Diagnosis Date   BPH (benign prostatic hyperplasia)    COPD (chronic obstructive pulmonary disease) (HCC)    Hypertension    OA (osteoarthritis)    PAF (paroxysmal atrial fibrillation) (HCC)     Significant Hospital Events   3/12 -  endoscopy, ERCP 3/16 Admission - 3/16>>CT Abdomen/ Pelvis Interval cholecystectomy. 7.6 x 10 x 17 cm fluid collection emanating from the gallbladder fossa and tracking posteriorly with a few bubbles of air within the collection which may reflect a biloma versus abscess 3/17 Transfer to ICU 3/17 Intubation and cvl - Intubated and Central Line placed for FFP and Levophed infusions.  ABG pending. CXR for CVC placement pending 3/18 - pall care holding off due to wife wantnig full Rx.40% fio2, fent gtt, levophed . Gets agitated easily preventing SBT     Consults:  3/17>> IR 3/17>> Surgery  Procedures:  3/17>> Intubation 3/17 L IJ CVC 3/12 ERCP 3/12 Upper Endoscopy  Significant Diagnostic Tests:  3/16>>CT Abdomen/ Pelvis Interval cholecystectomy. 7.6 x 10 x 17 cm fluid collection emanating from the gallbladder fossa and tracking posteriorly with a few bubbles of air within the collection which may reflect a biloma versus abscess.  Micro Data:  3/16 Blood >> 3/16 Urine >> 3/17 Urine Strep > 3/17 Urine Legionella  Antimicrobials:  Cefepime 3/17>> Flagyl 3/17>> Vanc 3/16 >>   Interim history/subjective:   04/22/17 - GI planning ERCP. n vent. RN says HOH + and his hearing aids not here.  Objective   Blood pressure 108/62, pulse (!) 104, temperature 98.7 F (37.1 C), temperature source Oral, resp. rate 15, height 5\' 6"  (1.676 m), weight 84.4 kg, SpO2 96 %.    Vent Mode: PSV;CPAP FiO2 (%):  [30 %-40 %] 30 % Set Rate:  [22 bmp] 22 bmp Vt Set:  [510 mL-610 mL] 510 mL PEEP:  [5 cmH20] 5 cmH20 Pressure Support:  [14 cmH20] 14 cmH20  Plateau Pressure:  [20 cmH20-22 cmH20] 21 cmH20   Intake/Output Summary (Last 24 hours) at 04/29/2018 1020 Last data filed at 04/29/2018 1009 Gross per 24 hour  Intake 5982.56 ml  Output 925 ml  Net 5057.56 ml   Filed Weights   04/26/18 2356 04/28/18 0415 04/29/18 0245  Weight: 79.5 kg 80.2 kg 84.4 kg    General Appearance:  Looks  criticall ill + Head:  Normocephalic, without obvious abnormality, atraumatic Eyes:  PERRL - yes, conjunctiva/corneas - no jaundice     Ears:  Normal external ear canals, both ears Nose:  G tube - no Throat:  ETT TUBE - YES , OG tube - no Neck:  Supple,  No enlargement/tenderness/nodules Lungs: Clear to auscultation bilaterally, Ventilator   Synchrony - YEs Heart:  S1 and S2 normal, no murmur, CVP - no.  Pressors - no Abdomen:  Soft, no masses, no organomegaly Genitalia / Rectal:  Not done Extremities:  Extremities- intact Skin:  ntact in exposed areas . Sacral area - stage 1 on sacrum per RN Neurologic:  Sedation - fent gtt -> RASS - -3 . Moves all 4s - yes. CAM-ICU - not able to test . Orientation - not tewted         LABS    PULMONARY Recent Labs  Lab 04/27/18 1315 04/27/18 1657  PHART 7.422 7.403  PCO2ART 30.2* 33.2  PO2ART 79.2* 252.0*  HCO3 19.1* 20.7  TCO2  --  22  O2SAT 95.2 100.0    CBC Recent Labs  Lab 04/27/18 1319 04/27/18 1657 04/28/18 0500 04/29/18 0501  HGB 10.6* 9.5* 7.3* 7.5*  HCT 31.6* 28.0* 22.8* 24.8*  WBC 19.2*  --  16.0* 15.4*  PLT 365  --  303 311    COAGULATION Recent Labs  Lab 04/26/18 1508 04/27/18 0336 04/27/18 1725 04/28/18 0500 04/29/18 0501  INR 2.2* 2.3* 1.7* 2.1* 1.7*    CARDIAC   Recent Labs  Lab 04/26/18 2103 04/27/18 1319 04/27/18 1725 04/28/18 0037 04/28/18 0429  TROPONINI 0.10* 0.26* 0.19* 0.16* 0.14*   No results for input(s): PROBNP in the last 168 hours.   CHEMISTRY Recent Labs  Lab 04/23/18 0420 04/26/18 1508 04/27/18 0336 04/27/18 1319 04/27/18 1657 04/28/18 0500 04/28/18 2345 04/29/18 0501  NA 137 135 137 139 140 142 141  --   K 3.8 2.9* 3.5 4.3 3.9 3.7 4.7  --   CL 106 99 104 108  --  110 115*  --   CO2 22 24 20* 19*  --  22 16*  --   GLUCOSE 112* 118* 104* 120*  --  105* 93  --   BUN 30*  --  39* 46*  --   CREATININE 1.43* 1.42* 1.84* 2.60*  --  2.82* 2.86*  --   CALCIUM  8.4* 8.2* 7.6* 7.2*  --  7.4* 7.0*  --   MG 1.7 2.0  --   --   --  1.6*  --  2.2  PHOS  --   --   --   --   --   --   --  2.5   Estimated Creatinine Clearance: 17.5 mL/min (A) (by C-G formula based on SCr of 2.86 mg/dL (H)).   LIVER Recent Labs  Lab 04/26/18 1508 04/27/18 0336 04/27/18 1319 04/27/18 1725 04/28/18 0500 04/29/18 0501  AST 53* 50* 47*  --  44* 89*  ALT 58* 45* 45*  --  36 45*  ALKPHOS 159* 120 102  --  139* 84  BILITOT 1.5* 2.1* 1.8*  --  1.6* 1.4*  PROT 6.8 5.4* 5.1*  --  5.4* 5.1*  ALBUMIN 2.8* 1.9* 1.7*  --  2.0* 1.8*  INR 2.2* 2.3*  --  1.7* 2.1* 1.7*     INFECTIOUS Recent Labs  Lab 04/27/18 1725 04/28/18 0143 04/28/18 0500 04/28/18 2345  LATICACIDVEN 3.6* 1.6  --  1.2  PROCALCITON  --   --  46.32  --      ENDOCRINE CBG (last 3)  Recent Labs    04/28/18 2348 04/29/18 0434 04/29/18 0831  GLUCAP 80 85 85         IMAGING x48h  - image(s) personally visualized  -   highlighted in bold Dg Chest Port 1 View  Result Date: 04/29/2018 CLINICAL DATA:  Intubation. EXAM: PORTABLE CHEST 1 VIEW COMPARISON:  04/28/2018. FINDINGS: Endotracheal tube, left IJ line, NG tube in stable position. Cardiomegaly. Left base infiltrate with small left pleural effusion. Interim progression from prior exam. No pneumothorax. IMPRESSION: 1.  Lines and tubes in stable position. 2. Left base infiltrate consistent with pneumonia. Left-sided pleural effusion. Interim progression of findings from prior exam. Electronically Signed   By: Maisie Fus  Register   On: 04/29/2018 06:09   Dg Chest Port 1 View  Result Date: 04/28/2018 CLINICAL DATA:  Hypoxia EXAM: PORTABLE CHEST 1 VIEW COMPARISON:  April 27, 2018 FINDINGS: Endotracheal tube tip is 4.4 cm above the carina. Central catheter tip is in the superior vena cava near the cavoatrial junction. Nasogastric tube tip and side port are below the diaphragm. No pneumothorax. There is persistent left lower lobe airspace consolidation  with small left pleural effusion. The right lung is clear. There is cardiomegaly with pulmonary vascularity normal. No adenopathy. There is aortic atherosclerosis. There is degenerative change in each shoulder. IMPRESSION: Tube and catheter positions as described without pneumothorax. Persistent left lower lobe consolidation, likely pneumonia, with small left pleural effusion. Right lung clear. Stable cardiac prominence. Aortic Atherosclerosis (ICD10-I70.0). Electronically Signed   By: Bretta Bang III M.D.   On: 04/28/2018 07:08   Dg Chest Port 1 View  Result Date: 04/27/2018 CLINICAL DATA:  Respiratory failure EXAM: PORTABLE CHEST 1 VIEW COMPARISON:  Film from earlier in the same day. FINDINGS: Cardiac shadow remains enlarged. Endotracheal tube is now seen with the tip approximately 1.4 cm deep within the right mainstem bronchus. This should be withdrawn several cm. Gastric catheter is noted within the stomach. Left jugular central line is seen with the tip in the distal superior vena cava. No pneumothorax is noted. Increasing left basilar atelectasis is noted. IMPRESSION: Tubes and lines as described above. The endotracheal tube should be withdrawn approximately 3 cm. Increasing left basilar consolidation. These results will be called to the ordering clinician or representative by the Radiologist Assistant, and communication documented in the PACS or zVision Dashboard. Electronically Signed   By: Alcide Clever M.D.   On: 04/27/2018 17:04   Dg Chest Port 1 View  Result Date: 04/27/2018 CLINICAL DATA:  85 year old male with shortness of breath, tachypnea. EXAM: PORTABLE CHEST 1 VIEW COMPARISON:  Chest radiographs yesterday, and November 2019. CT Abdomen and Pelvis yesterday. FINDINGS: Portable AP semi upright view at 1335 hours. Continued low lung volumes. Stable cardiac size and mediastinal contours. Visualized tracheal air column is within normal limits. Continued bilateral increased pulmonary  interstitial markings and confluent retrocardiac opacity. No pneumothorax. Continued small pleural effusions suspected. Negative visible bowel gas pattern. IMPRESSION: Continued low lung volumes with  left lower lobe consolidation suspicious for Pneumonia and small pleural effusions. Electronically Signed   By: Odessa Fleming M.D.   On: 04/27/2018 13:55   Dg Abd Portable 1v  Result Date: 04/27/2018 CLINICAL DATA:  Abdominal pain EXAM: PORTABLE ABDOMEN - 1 VIEW COMPARISON:  04/26/2018 FINDINGS: Scattered large and small bowel gas is noted. No obstructive changes are seen. Gastric catheter is noted within the stomach. No acute bony abnormality is seen. IMPRESSION: No acute abnormality is noted. Electronically Signed   By: Alcide Clever M.D.   On: 04/27/2018 17:02   Ct Image Guided Fluid Drain By Catheter  Result Date: 04/28/2018 CLINICAL DATA:  Subhepatic collection post cholecystectomy, hemodynamic instability, drainage requested EXAM: CT GUIDED DRAINAGE OF PERITONEAL ABSCESS ANESTHESIA/SEDATION: lidocaine 1% subcutaneous PROCEDURE: The procedure, risks, benefits, and alternatives were explained to the patient. Questions regarding the procedure were encouraged and answered. The patient understands and consents to the procedure. Select axial scans through the upper abdomen obtained. The collection was localized and an appropriate skin entry site was determined and marked. The operative field was prepped with chlorhexidinein a sterile fashion, and a sterile drape was applied covering the operative field. A sterile gown and sterile gloves were used for the procedure. Local anesthesia was provided with 1% Lidocaine. Under CT fluoroscopic guidance, 18 gauge percutaneous entry needle advanced into the collection. Bilious fluid returned. Amplatz wire advanced easily, position confirmed on CT. Tract dilated to facilitate placement of a 12 French pigtail drain catheter, placed within the central dependent aspect of the  collection. Catheter secured externally with 0 Prolene suture and StatLock and placed to gravity drain bag. 30 mL aspirate was sent for Gram stain and culture. The patient tolerated the procedure well. COMPLICATIONS: None immediate FINDINGS: Subhepatic fluid collection was localized. 12 French drain catheter placed as above. Sample of the bilious aspirate sent for Gram stain and culture. IMPRESSION: 1. Technically successful CT-guided right upper quadrant abscess drain catheter placement. Electronically Signed   By: Corlis Leak M.D.   On: 04/28/2018 09:13   Nm Hepatobiliary Including Gb  Result Date: 04/28/2018 CLINICAL DATA:  85 year old post laparoscopic cholecystectomy 04/16/2018 followed by ERCP with biliary sphincterotomy. Abdominal pain, fever and subhepatic fluid collection on CT. Evaluate for biliary leak. EXAM: NUCLEAR MEDICINE HEPATOBILIARY IMAGING TECHNIQUE: Sequential images of the abdomen were obtained out to 60 minutes following intravenous administration of radiopharmaceutical. RADIOPHARMACEUTICALS:  5.3 mCi Tc-12m  Choletec IV COMPARISON:  Abdominopelvic CT 04/26/2018. FINDINGS: There is prompt uptake in biliary excretion of activity. There is prompt drainage into the biliary system and small bowel. There is progressive accumulation of activity in the subhepatic space, corresponding with the fluid collection on recent CT and consistent with a contained bile leak. No free peritoneal activity identified. IMPRESSION: Progressive accumulation of activity in the subhepatic space consistent with a contained bile leak. No evidence of biliary obstruction. Electronically Signed   By: Carey Bullocks M.D.   On: 04/28/2018 16:04     Resolved Hospital Problem list     Assessment & Plan:  Acute Respiratory Failure in setting of suspected LLL pneumonia , small pleural effusions, and suspected peritonitis History of COPD Intubated 3/17>> Suspected aspiration   04/29/2018 - > does not meet criteria  for SBT/Extubation in setting of Acute Respiratory Failure due to acute encephalopathy, and ERCP need    Plan: PRVC No extubation  VAP prevention   Circulatory shock  A: off levophed 04/29/2018 P:  - MAP goal > 65 - levophed  -  fluid bolus 2L    Acute Kidney Injury   04/29/2018 - Worse  Plan  - fluid bolus - foley - change fluids with K to D5LR  maintanenace     Acute Abdomen 2/2 peritonitis due to gall bladder fossa abscess  04/29/2018 -> has bililar drain. GI planning ERCR 04/30/18   Plan: Per Surgery>> Source control ( Consulted) ABX as above OG tube to low intermittent suction for decompression NPO TF/ TNA  when appropriate for nutrition GI and CCS following   Elevated INR  - Last Eliquis 3/16 at 7 am./ Vitamin K,  5 mg given  FFP 4 units   3/19 - no overt bleeding   Plan: Reversal of Eliquis for surgical source control - PRBC for hgb </= 6.9gm%    - exceptions are   -  if ACS susepcted/confirmed then transfuse for hgb </= 8.0gm%,  or    -  active bleeding with hemodynamic instability, then transfuse regardless of hemoglobin value   At at all times try to transfuse 1 unit prbc as possible with exception of active hemorrhage    Endocrine Plan: CBG's Q 4 SSI      Best practice:  Diet: NPO Pain/Anxiety/Delirium protocol (if indicated): Fentanyl gtt/ Versed prn VAP protocol (if indicated): Ordered DVT prophylaxis: INR > 2.0 , PAS hose but after ERCP wil need heparin/lovenox GI prophylaxis: Protonix Glucose control: CBG/ SSI Mobility: BR Code Status: Full Family Communication: Family updated by Dr. Vassie Loll 3/17. Wife Alona Bene updated on phone 04/28/2018. Full code. Palliative care holding off depending on course Disposition: ICU       ATTESTATION & SIGNATURE   The patient Alam Bota is critically ill with multiple organ systems failure and requires high complexity decision making for assessment and support, frequent evaluation and  titration of therapies, application of advanced monitoring technologies and extensive interpretation of multiple databases.   Critical Care Time devoted to patient care services described in this note is  30    Minutes. This time reflects time of care of this signee Dr Kalman Shan. This critical care time does not reflect procedure time, or teaching time or supervisory time of PA/NP/Med student/Med Resident etc but could involve care discussion time     Dr. Kalman Shan, M.D., [facility].C.P Pulmonary and Critical Care Medicine Staff Physician [facility] [place] [facility] Pager: [phone], If no answer or between  15:00h - 7:00h: call [phone]  04/29/2018 10:38 AM

## 2018-04-30 ENCOUNTER — Other Ambulatory Visit: Payer: Self-pay

## 2018-04-30 ENCOUNTER — Inpatient Hospital Stay (HOSPITAL_COMMUNITY): Payer: Medicare Other

## 2018-04-30 ENCOUNTER — Encounter (HOSPITAL_COMMUNITY): Payer: Self-pay | Admitting: Anesthesiology

## 2018-04-30 ENCOUNTER — Inpatient Hospital Stay (HOSPITAL_COMMUNITY): Payer: Medicare Other | Admitting: Anesthesiology

## 2018-04-30 ENCOUNTER — Encounter (HOSPITAL_COMMUNITY)
Admission: EM | Disposition: A | Discharge status: Home Health | Payer: Self-pay | Source: Home / Self Care | Attending: Internal Medicine

## 2018-04-30 HISTORY — PX: ERCP: SHX5425

## 2018-04-30 HISTORY — PX: BILIARY STENT PLACEMENT: SHX5538

## 2018-04-30 LAB — MAGNESIUM
Magnesium: 2.1 mg/dL (ref 1.7–2.4)
Magnesium: 2 mg/dL (ref 1.7–2.4)

## 2018-04-30 LAB — CBC WITH DIFFERENTIAL/PLATELET
Abs Immature Granulocytes: 0.06 10*3/uL (ref 0.00–0.07)
Basophils Absolute: 0 10*3/uL (ref 0.0–0.1)
Basophils Relative: 0 %
Eosinophils Absolute: 0.2 10*3/uL (ref 0.0–0.5)
Eosinophils Relative: 1 %
HCT: 23.8 % — ABNORMAL LOW (ref 39.0–52.0)
HEMOGLOBIN: 7.3 g/dL — AB (ref 13.0–17.0)
Immature Granulocytes: 0 %
Lymphocytes Relative: 7 %
Lymphs Abs: 1 10*3/uL (ref 0.7–4.0)
MCH: 30 pg (ref 26.0–34.0)
MCHC: 30.7 g/dL (ref 30.0–36.0)
MCV: 97.9 fL (ref 80.0–100.0)
MONO ABS: 1.1 10*3/uL — AB (ref 0.1–1.0)
Monocytes Relative: 8 %
Neutro Abs: 11.7 10*3/uL — ABNORMAL HIGH (ref 1.7–7.7)
Neutrophils Relative %: 84 %
Platelets: 302 10*3/uL (ref 150–400)
RBC: 2.43 MIL/uL — AB (ref 4.22–5.81)
RDW: 13.5 % (ref 11.5–15.5)
WBC: 14.1 10*3/uL — ABNORMAL HIGH (ref 4.0–10.5)
nRBC: 0 % (ref 0.0–0.2)

## 2018-04-30 LAB — BASIC METABOLIC PANEL
Anion gap: 3 — ABNORMAL LOW (ref 5–15)
Anion gap: 6 (ref 5–15)
BUN: 51 mg/dL — ABNORMAL HIGH (ref 8–23)
BUN: 54 mg/dL — ABNORMAL HIGH (ref 8–23)
CHLORIDE: 119 mmol/L — AB (ref 98–111)
CO2: 18 mmol/L — ABNORMAL LOW (ref 22–32)
CO2: 20 mmol/L — ABNORMAL LOW (ref 22–32)
Calcium: 7.1 mg/dL — ABNORMAL LOW (ref 8.9–10.3)
Calcium: 7.4 mg/dL — ABNORMAL LOW (ref 8.9–10.3)
Chloride: 120 mmol/L — ABNORMAL HIGH (ref 98–111)
Creatinine, Ser: 2.89 mg/dL — ABNORMAL HIGH (ref 0.61–1.24)
Creatinine, Ser: 2.99 mg/dL — ABNORMAL HIGH (ref 0.61–1.24)
GFR calc Af Amer: 20 mL/min — ABNORMAL LOW (ref >60)
GFR calc non Af Amer: 18 mL/min — ABNORMAL LOW (ref >60)
GFR calc non Af Amer: 18 mL/min — ABNORMAL LOW (ref >60)
GFR, EST AFRICAN AMERICAN: 21 mL/min — AB (ref >60)
Glucose, Bld: 132 mg/dL — ABNORMAL HIGH (ref 70–99)
Glucose, Bld: 146 mg/dL — ABNORMAL HIGH (ref 70–99)
Potassium: 4.5 mmol/L (ref 3.5–5.1)
Potassium: 5.1 mmol/L (ref 3.5–5.1)
SODIUM: 142 mmol/L (ref 135–145)
Sodium: 144 mmol/L (ref 135–145)

## 2018-04-30 LAB — SURGICAL PCR SCREEN
MRSA, PCR: NEGATIVE
Staphylococcus aureus: NEGATIVE

## 2018-04-30 LAB — GLUCOSE, CAPILLARY
GLUCOSE-CAPILLARY: 121 mg/dL — AB (ref 70–99)
GLUCOSE-CAPILLARY: 119 mg/dL — AB (ref 70–99)
Glucose-Capillary: 119 mg/dL — ABNORMAL HIGH (ref 70–99)
Glucose-Capillary: 121 mg/dL — ABNORMAL HIGH (ref 70–99)
Glucose-Capillary: 122 mg/dL — ABNORMAL HIGH (ref 70–99)
Glucose-Capillary: 130 mg/dL — ABNORMAL HIGH (ref 70–99)

## 2018-04-30 LAB — PROTIME-INR
INR: 1.7 — AB (ref 0.8–1.2)
Prothrombin Time: 19.4 seconds — ABNORMAL HIGH (ref 11.4–15.2)

## 2018-04-30 LAB — CREATININE, SERUM
Creatinine, Ser: 3.02 mg/dL — ABNORMAL HIGH (ref 0.61–1.24)
GFR calc Af Amer: 20 mL/min — ABNORMAL LOW (ref >60)
GFR calc non Af Amer: 17 mL/min — ABNORMAL LOW (ref >60)

## 2018-04-30 LAB — LACTIC ACID, PLASMA: Lactic Acid, Venous: 1.3 mmol/L (ref 0.5–1.9)

## 2018-04-30 LAB — PHOSPHORUS: Phosphorus: 2.3 mg/dL — ABNORMAL LOW (ref 2.5–4.6)

## 2018-04-30 SURGERY — ERCP, WITH INTERVENTION IF INDICATED
Anesthesia: General

## 2018-04-30 MED ORDER — SODIUM CHLORIDE 0.9% IV SOLUTION: Freq: Once | INTRAVENOUS | Status: DC

## 2018-04-30 MED ORDER — ESMOLOL HCL 100 MG/10ML IV SOLN
INTRAVENOUS | Status: DC | PRN
  Administered 2018-04-30 (×3): 20 mg via INTRAVENOUS

## 2018-04-30 MED ORDER — NOREPINEPHRINE 4 MG/250ML-% IV SOLN
0.0000 ug/min | INTRAVENOUS | Status: DC
  Administered 2018-04-30: 2 ug/min via INTRAVENOUS
  Filled 2018-04-30 (×2): qty 250

## 2018-04-30 MED ORDER — SODIUM CHLORIDE 0.9 % IV SOLN
INTRAVENOUS | Status: DC | PRN
  Administered 2018-04-30: 25 mL

## 2018-04-30 MED ORDER — MIDAZOLAM HCL 2 MG/2ML IJ SOLN
INTRAMUSCULAR | Status: DC | PRN
  Administered 2018-04-30: 2 mg via INTRAVENOUS

## 2018-04-30 MED ORDER — LACTATED RINGERS IV BOLUS
1000.0000 mL | Freq: Once | INTRAVENOUS | Status: AC
  Administered 2018-04-30: 1000 mL via INTRAVENOUS

## 2018-04-30 MED ORDER — ALBUMIN HUMAN 5 % IV SOLN
INTRAVENOUS | Status: DC | PRN
  Administered 2018-04-30: 13:00:00 via INTRAVENOUS

## 2018-04-30 MED ORDER — DOXAZOSIN MESYLATE 4 MG PO TABS: 4.0000 mg | ORAL_TABLET | Freq: Every day | ORAL | Status: DC

## 2018-04-30 MED ORDER — FENTANYL CITRATE (PF) 2500 MCG/50ML IJ SOLN
25.0000 ug/h | Status: DC
  Administered 2018-04-30: 50 ug/h via INTRAVENOUS
  Administered 2018-05-01: 175 ug/h via INTRAVENOUS
  Filled 2018-04-30: qty 100
  Filled 2018-04-30: qty 50

## 2018-04-30 MED ORDER — GLUCAGON HCL RDNA (DIAGNOSTIC) 1 MG IJ SOLR
INTRAMUSCULAR | Status: AC
  Filled 2018-04-30: qty 1

## 2018-04-30 MED ORDER — ROCURONIUM BROMIDE 50 MG/5ML IV SOSY
PREFILLED_SYRINGE | INTRAVENOUS | Status: DC | PRN
  Administered 2018-04-30: 50 mg via INTRAVENOUS

## 2018-04-30 MED ORDER — METOPROLOL SUCCINATE ER 25 MG PO TB24: 25.0000 mg | ORAL_TABLET | Freq: Every day | ORAL | Status: DC

## 2018-04-30 MED ORDER — VITAMIN K1 10 MG/ML IJ SOLN
10.0000 mg | Freq: Once | INTRAVENOUS | Status: AC
  Administered 2018-04-30: 10 mg via INTRAVENOUS
  Filled 2018-04-30: qty 1

## 2018-04-30 MED ORDER — SODIUM CHLORIDE 0.9 % IV SOLN: INTRAVENOUS | Status: DC

## 2018-04-30 MED ORDER — INDOMETHACIN 50 MG RE SUPP
RECTAL | Status: AC
  Filled 2018-04-30: qty 2

## 2018-04-30 MED ORDER — SODIUM CHLORIDE 0.9 % IV SOLN
INTRAVENOUS | Status: DC | PRN
  Administered 2018-04-30: 500 mL via INTRAVENOUS

## 2018-04-30 MED ORDER — SODIUM PHOSPHATES 45 MMOLE/15ML IV SOLN
10.0000 mmol | Freq: Once | INTRAVENOUS | Status: AC
  Administered 2018-04-30: 10 mmol via INTRAVENOUS
  Filled 2018-04-30: qty 3.33

## 2018-04-30 MED ORDER — LACTATED RINGERS IV SOLN
INTRAVENOUS | Status: DC | PRN
  Administered 2018-04-30: 12:00:00 via INTRAVENOUS

## 2018-04-30 MED ORDER — INDOMETHACIN 50 MG RE SUPP
RECTAL | Status: DC | PRN
  Administered 2018-04-30: 100 mg via RECTAL

## 2018-04-30 MED ORDER — SODIUM CHLORIDE 0.9 % IV SOLN
INTRAVENOUS | Status: DC | PRN
  Administered 2018-04-30: 25 ug/min via INTRAVENOUS

## 2018-04-30 NOTE — Consult Note (Addendum)
NAME:  Vincent Barrett, MRN:  [id number], DOB:  26-Oct-1925, LOS: 4 ADMISSION DATE:  04/26/2018, CONSULTATION DATE:  04/27/2018 REFERRING MD: Hanley Ben, CHIEF COMPLAINT:  Respiratory Distress, Suspected sepsis   Brief History    85 year old male with history of paroxysmal atrial fibrillation on Eliquis, hypertension, COPD, CKD stage III, BPH, recent acute calculus cholecystitis status post laparoscopic cholecystectomy on 04/16/2018 followed by ERCP with biliary sphincterotomy and balloon extraction of the CBD stone on 04/22/2018 and subsequent discharge afterwards presented with abdominal pain and shortness of breath on 04/26/2018.  He was found to be febrile and tachycardic with significant leukocytosis.  CT abdomen and pelvis showed possible gallbladder fossa abscess versus biloma.  He was started on broad-spectrum antibiotics.  General surgery was consulted who recommended IR consult. IR planned to give Vitamin K and 4 units FFP, and evaluate INR and patient in the morning. Dr. Vassie Loll has called surgery to re-evaluate for source control at an earlier date. Pt has subsequently decompensated. He has developed respiratory distress, with RR of 48 . CXR shows low lung volumes with LLL consolidation suspicious for pneumonia, small pleural effusions. He sounds wet. Lactate is 4.2 up from 1.0 on 3/16. Max Lactate was 1.5 on 3/16 am. He will receive 4 units FFP. He is hypotensive at present, WBC has increased from 13.4 to 19.2 in the last 6 hours.Suspect this has evolved into peritonitis.Patient has been transferred to the ICU and PCCM have been consulted for management of care.Per nursing , the family does want everything done.   Past Medical History   Past Medical History:  Diagnosis Date  . BPH (benign prostatic hyperplasia)   . COPD (chronic obstructive pulmonary disease) (HCC)   . Hypertension   . OA (osteoarthritis)   . PAF (paroxysmal atrial fibrillation) (HCC)     Significant Hospital Events   3/12 -  endoscopy, ERCP 3/16 Admission - 3/16>>CT Abdomen/ Pelvis Interval cholecystectomy. 7.6 x 10 x 17 cm fluid collection emanating from the gallbladder fossa and tracking posteriorly with a few bubbles of air within the collection which may reflect a biloma versus abscess 3/17 Transfer to ICU 3/17 Intubation and cvl - Intubated and Central Line placed for FFP and Levophed infusions.  ABG pending. CXR for CVC placement pending 3/18 - pall care holding off due to wife wantnig full Rx.40% fio2, fent gtt, levophed . Gets agitated easily preventing SBT 04/28/17 - GI planning ERCP. n vent. RN says HOH + and his hearing aids not here.  3/20 POST-OPERATIVE DIAGNOSIS:  BILE LEAK S/P STENT PLACEMENT  PROCEDURE:  Procedure(s): ENDOSCOPIC RETROGRADE CHOLANGIOPANCREATOGRAPHY (ERCP) (N/A) BILIARY STENT PLACEMENT    Consults:  3/17>> IR 3/17>> Surgery  Procedures:  3/17>> Intubation 3/17 L IJ CVC 3/12 ERCP 3/12 Upper Endoscopy  Significant Diagnostic Tests:  3/16>>CT Abdomen/ Pelvis Interval cholecystectomy. 7.6 x 10 x 17 cm fluid collection emanating from the gallbladder fossa and tracking posteriorly with a few bubbles of air within the collection which may reflect a biloma versus abscess.  Micro Data:  3/16 Blood >> 3/16 Urine >> 3/17 Urine Strep > 3/17 Urine Legionella  Antimicrobials:  Cefepime 3/17>> Flagyl 3/17>> Vanc 3/16 >> stopped   Interim history/subjective:   3/20 - ercp with cbd stent for bile leak but there was oozing of blood and FFP/Vit K given by GI. Dr Marca Ancona concerned that stent might bget blocked,. Renal failure worse - creat 3  Objective   Blood pressure 106/68, pulse 86, temperature 98.5 F (36.9 C), temperature source Oral,  resp. rate (!) 22, height 5\' 6"  (1.676 m), weight 84.4 kg, SpO2 97 %.    Vent Mode: PRVC FiO2 (%):  [30 %] 30 % Set Rate:  [22 bmp] 22 bmp Vt Set:  [510 mL-610 mL] 610 mL PEEP:  [5 cmH20] 5 cmH20 Plateau Pressure:  [18  cmH20-25 cmH20] 25 cmH20   Intake/Output Summary (Last 24 hours) at 04/30/2018 1454 Last data filed at 04/30/2018 1400 Gross per 24 hour  Intake 2938.32 ml  Output 710 ml  Net 2228.32 ml   Filed Weights   04/26/18 2356 04/28/18 0415 04/29/18 0245  Weight: 79.5 kg 80.2 kg 84.4 kg    General Appearance:  Looks criticall il Head:  Normocephalic, without obvious abnormality, atraumatic Eyes:  PERRL - yes, conjunctiva/corneas - muddy     Ears:  Normal external ear canals, both ears Nose:  G tube - no Throat:  ETT TUBE - yes , OG tube - yes Neck:  Supple,  No enlargement/tenderness/nodules Lungs: Clear to auscultation bilaterally, Ventilator   Synchrony - yes Heart:  S1 and S2 normal, no murmur, CVP - no.  Pressors - leovphed 59mc Abdomen:  Soft, no masses, no organomegaly Genitalia / Rectal:  Not done Extremities:  Extremities- intac Skin:  ntact in exposed areas . Sacral area - not examined Neurologic:  Sedation - fent gtt -> RASS - -3    LABS    PULMONARY Recent Labs  Lab 04/27/18 1315 04/27/18 1657  PHART 7.422 7.403  PCO2ART 30.2* 33.2  PO2ART 79.2* 252.0*  HCO3 19.1* 20.7  TCO2  --  22  O2SAT 95.2 100.0    CBC Recent Labs  Lab 04/28/18 0500 04/29/18 0501 04/30/18 0417  HGB 7.3* 7.5* 7.3*  HCT 22.8* 24.8* 23.8*  WBC 16.0* 15.4* 14.1*  PLT 303 311 302    COAGULATION Recent Labs  Lab 04/27/18 0336 04/27/18 1725 04/28/18 0500 04/29/18 0501 04/30/18 0417  INR 2.3* 1.7* 2.1* 1.7* 1.7*    CARDIAC   Recent Labs  Lab 04/26/18 2103 04/27/18 1319 04/27/18 1725 04/28/18 0037 04/28/18 0429  TROPONINI 0.10* 0.26* 0.19* 0.16* 0.14*   No results for input(s): PROBNP in the last 168 hours.   CHEMISTRY Recent Labs  Lab 04/26/18 1508 04/27/18 0336 04/27/18 1319 04/27/18 1657 04/28/18 0500 04/28/18 2345 04/29/18 0501 04/30/18 0000 04/30/18 0417  NA 135 137 139 140 142 141  --  142  --   K 2.9* 3.5 4.3 3.9 3.7 4.7  --  5.1  --   CL 99 104 108   --  110 115*  --  119*  --   CO2 24 20* 19*  --  22 16*  --  20*  --   GLUCOSE 118* 104* 120*  --  105* 93  --  132*  --   BUN 21 21 30*  --  39* 46*  --  54*  --   CREATININE 1.42* 1.84* 2.60*  --  2.82* 2.86*  --  2.99* 3.02*  CALCIUM 8.2* 7.6* 7.2*  --  7.4* 7.0*  --  7.1*  --   MG 2.0  --   --   --  1.6*  --  2.2  --  2.1  PHOS  --   --   --   --   --   --  2.5  --  2.3*   Estimated Creatinine Clearance: 16.6 mL/min (A) (by C-G formula based on SCr of 3.02 mg/dL (H)).   LIVER Recent Labs  Lab 04/26/18 1508 04/27/18 0336 04/27/18 1319 04/27/18 1725 04/28/18 0500 04/29/18 0501 04/30/18 0417  AST 53* 50* 47*  --  44* 89*  --   ALT 58* 45* 45*  --  36 45*  --   ALKPHOS 159* 120 102  --  139* 84  --   BILITOT 1.5* 2.1* 1.8*  --  1.6* 1.4*  --   PROT 6.8 5.4* 5.1*  --  5.4* 5.1*  --   ALBUMIN 2.8* 1.9* 1.7*  --  2.0* 1.8*  --   INR 2.2* 2.3*  --  1.7* 2.1* 1.7* 1.7*     INFECTIOUS Recent Labs  Lab 04/28/18 0143 04/28/18 0500 04/28/18 2345 04/30/18 0000  LATICACIDVEN 1.6  --  1.2 1.3  PROCALCITON  --  46.32  --   --      ENDOCRINE CBG (last 3)  Recent Labs    04/30/18 0422 04/30/18 0752 04/30/18 1132  GLUCAP 119* 130* 121*         IMAGING x48h  - image(s) personally visualized  -   highlighted in bold Dg Chest Port 1 View  Result Date: 04/29/2018 CLINICAL DATA:  Intubation. EXAM: PORTABLE CHEST 1 VIEW COMPARISON:  04/28/2018. FINDINGS: Endotracheal tube, left IJ line, NG tube in stable position. Cardiomegaly. Left base infiltrate with small left pleural effusion. Interim progression from prior exam. No pneumothorax. IMPRESSION: 1.  Lines and tubes in stable position. 2. Left base infiltrate consistent with pneumonia. Left-sided pleural effusion. Interim progression of findings from prior exam. Electronically Signed   By: Maisie Fus  Register   On: 04/29/2018 06:09   Dg Ercp  Result Date: 04/30/2018 CLINICAL DATA:  Biliary stent EXAM: ERCP TECHNIQUE: Multiple  spot images obtained with the fluoroscopic device and submitted for interpretation post-procedure. FLUOROSCOPY TIME:  Fluoroscopy Time:  34 seconds Radiation Exposure Index (if provided by the fluoroscopic device): Number of Acquired Spot Images: 0 COMPARISON:  None. FINDINGS: Two spot images demonstrate a biliary stent in the common bile duct. IMPRESSION: See above. These images were submitted for radiologic interpretation only. Please see the procedural report for the amount of contrast and the fluoroscopy time utilized. Electronically Signed   By: Jolaine Click M.D.   On: 04/30/2018 12:56   Nm Hepatobiliary Including Gb  Result Date: 04/28/2018 CLINICAL DATA:  85 year old post laparoscopic cholecystectomy 04/16/2018 followed by ERCP with biliary sphincterotomy. Abdominal pain, fever and subhepatic fluid collection on CT. Evaluate for biliary leak. EXAM: NUCLEAR MEDICINE HEPATOBILIARY IMAGING TECHNIQUE: Sequential images of the abdomen were obtained out to 60 minutes following intravenous administration of radiopharmaceutical. RADIOPHARMACEUTICALS:  5.3 mCi Tc-68m  Choletec IV COMPARISON:  Abdominopelvic CT 04/26/2018. FINDINGS: There is prompt uptake in biliary excretion of activity. There is prompt drainage into the biliary system and small bowel. There is progressive accumulation of activity in the subhepatic space, corresponding with the fluid collection on recent CT and consistent with a contained bile leak. No free peritoneal activity identified. IMPRESSION: Progressive accumulation of activity in the subhepatic space consistent with a contained bile leak. No evidence of biliary obstruction. Electronically Signed   By: Carey Bullocks M.D.   On: 04/28/2018 16:04     Resolved Hospital Problem list     Assessment & Plan:  Acute Respiratory Failure in setting of suspected LLL pneumonia , small pleural effusions, and suspected peritonitis History of COPD Intubated 3/17>> Suspected aspiration    04/30/2018 - > does not meet criteria for SBT/Extubation in setting of Acute Respiratory Failure due to  shock, aki, sedation, seopsis   Plan: PRVC VAP prevention   Circulatory shock  A: back on levophed 3/20 low dose  P:  - MAP goal > 65 - levophed  - fluid bolus 1KL   Acute Kidney Injury   04/30/2018 - Worse now in ATN  Plan  - fluid bolus reep-at - foley - D5LR  maintanenace     Acute Abdomen 2/2 peritonitis due to gall bladder fossa abscess  04/30/2018 -> s/p ERCP with stent  Plan: Per Surgery>> Source control ( Consulted) ABX as above OG tube to low intermittent suction for decompression NPO TF/ TNA  when appropriate for nutrition GI and CCS following   Elevated INR  - Last Eliquis 3/16 at 7 am./ Vitamin K,  5 mg given , FFP 4 units   3/19 - no overt bleeding   Plan: Reversal of Eliquis for surgical source control - PRBC for hgb </= 6.9gm%    - exceptions are   -  if ACS susepcted/confirmed then transfuse for hgb </= 8.0gm%,  or    -  active bleeding with hemodynamic instability, then transfuse regardless of hemoglobin value   At at all times try to transfuse 1 unit prbc as possible with exception of active hemorrhage    Endocrine Plan: CBG's Q 4 SSI      Best practice:  Diet: NPO Pain/Anxiety/Delirium protocol (if indicated): Fentanyl gtt/ Versed prn VAP protocol (if indicated): Ordered DVT prophylaxis: INR > 2.0 , PAS hose but after ERCP wil need heparin/lovenox. Given FFP again 04/30/2018 GI prophylaxis: Protonix Glucose control: CBG/ SSI Mobility: BR Code Status: Full Family Communication: Family updated by Dr. Vassie Loll 3/17. Wife Alona Bene updated on phone 04/28/2018. D/w Wife agani 04/30/2018 -. > explained developing MODS -> agreed No CPR, NO LTAC, NO TRACH, NO SNF,. NO CRRT -> full medical care otherwise. If worsens next few days wife agreeable to comfort  Disposition: ICU      ATTESTATION & SIGNATURE   The patient Iman Orourke is  critically ill with multiple organ systems failure and requires high complexity decision making for assessment and support, frequent evaluation and titration of therapies, application of advanced monitoring technologies and extensive interpretation of multiple databases.   Critical Care Time devoted to patient care services described in this note is  30  Minutes. This time reflects time of care of this signee Dr Kalman Shan. This critical care time does not reflect procedure time, or teaching time or supervisory time of PA/NP/Med student/Med Resident etc but could involve care discussion time     Dr. Kalman Shan, M.D., [facility].C.P Pulmonary and Critical Care Medicine Staff Physician [facility] Pager: [phone], If no answer or between  15:00h - 7:00h: call [phone]  04/30/2018 2:54 PM

## 2018-04-30 NOTE — Transfer of Care (Signed)
Immediate Anesthesia Transfer of Care Note  Patient: Vincent Barrett  Procedure(s) Performed: ENDOSCOPIC RETROGRADE CHOLANGIOPANCREATOGRAPHY (ERCP) (N/A ) BILIARY STENT PLACEMENT  Patient Location: ICU  Anesthesia Type:General  Level of Consciousness: sedated, unresponsive and Patient remains intubated per anesthesia plan  Airway & Oxygen Therapy: Patient remains intubated per anesthesia plan and Patient placed on Ventilator (see vital sign flow sheet for setting)  Post-op Assessment: Report given to RN and Post -op Vital signs reviewed and stable  Post vital signs: Reviewed and stable  Last Vitals:  Vitals Value Taken Time  BP 103/70 04/30/2018  1:06 PM  Temp    Pulse 104 04/30/2018  1:07 PM  Resp 22 04/30/2018  1:07 PM  SpO2 94 % 04/30/2018  1:07 PM  Vitals shown include unvalidated device data.  Last Pain:  Vitals:   04/30/18 0745  TempSrc: Oral  PainSc:       Patients Stated Pain Goal: 0 (04/27/18 0000)  Complications: No apparent anesthesia complications

## 2018-04-30 NOTE — Interval H&P Note (Signed)
History and Physical Interval Note: 89/male with bile leak. He has had cholecystectomy and ERCP for CBD stone removal.  04/30/2018 12:06 PM  Vincent Barrett  has presented today for ERCP, with the diagnosis of Bile leak.  The various methods of treatment have been discussed with the patient and family. After consideration of risks, benefits and other options for treatment, the patient has consented to  Procedure(s): ENDOSCOPIC RETROGRADE CHOLANGIOPANCREATOGRAPHY (ERCP) (N/A) as a surgical intervention.  The patient's history has been reviewed, patient examined, no change in status, stable for surgery.  I have reviewed the patient's chart and labs.  Questions were answered to the patient's satisfaction.     Kerin Salen

## 2018-04-30 NOTE — Progress Notes (Cosign Needed)
Referring Physician(s): Dr Marchelle Gearingamaswamy  Supervising Physician: Gilmer MorWagner, Jaime  Patient Status:  [facility]  Chief Complaint:  3/17: Procedure:CT RUQ subhepatic biloma drain   Subjective:  Intubated No movement OP of drain  Is bilious  Allergies: Quinine  Medications: Prior to Admission medications   Medication Sig Start Date End Date Taking? Authorizing Provider  amLODipine (NORVASC) 2.5 MG tablet Take 1 tablet (2.5 mg total) by mouth daily. 02/21/16  Yes Hilty, Lisette AbuKenneth C, MD  Ascorbic Acid (VITAMIN C) 1000 MG tablet Take 1,000 mg by mouth daily.   Yes [provider]  doxazosin (CARDURA) 4 MG tablet Take 1 tablet (4 mg total) by mouth daily. KEEP OV. 01/16/17  Yes Hilty, Lisette AbuKenneth C, MD  ELIQUIS 5 MG TABS tablet TAKE 1 TABLET BY MOUTH TWICE A DAY 04/26/18  Yes Hilty, Lisette AbuKenneth C, MD  losartan-hydrochlorothiazide (HYZAAR) 100-25 MG tablet Take 1 tablet by mouth daily. 01/18/18  Yes Hilty, Lisette AbuKenneth C, MD  metoprolol succinate (TOPROL-XL) 25 MG 24 hr tablet TAKE 1 TABLET (25 MG TOTAL) BY MOUTH DAILY. 04/13/17  Yes Hilty, Lisette AbuKenneth C, MD  Multiple Vitamins-Minerals (PRESERVISION AREDS 2) CAPS Take 1 capsule by mouth daily.   Yes [provider]  ondansetron (ZOFRAN) 4 MG tablet Take 1 tablet (4 mg total) by mouth every 6 (six) hours as needed for nausea. 04/23/18  Yes Glade LloydAlekh, Kshitiz, MD  traMADol (ULTRAM) 50 MG tablet Take 1 tablet (50 mg total) by mouth every 6 (six) hours as needed for severe pain. 04/23/18 04/23/19 Yes Glade LloydAlekh, Kshitiz, MD     Vital Signs: BP 95/67    Pulse 79    Temp 98.5 F (36.9 C) (Oral)    Resp (!) 22    Ht 5\' 6"  (1.676 m)    Wt 186 lb 1.1 oz (84.4 kg)    SpO2 99%    BMI 30.03 kg/m   Physical Exam Vitals signs reviewed.  Skin:    General: Skin is warm and dry.     Comments: Site is clean and dry No bleeding OP 260-160- 50 cc in bag Bilious appearing MODERATE SERRATIA MARCESCENS  SUSCEPTIBILITIES TO FOLLOW      Imaging: Ct Abdomen Pelvis  Wo Contrast  Result Date: 04/26/2018 CLINICAL DATA:  Gallbladder removed Friday. Nonproductive cough. Elevated temperature. Abdominal pain. EXAM: CT ABDOMEN AND PELVIS WITHOUT CONTRAST TECHNIQUE: Multidetector CT imaging of the abdomen and pelvis was performed following the standard protocol without IV contrast. COMPARISON:  CT abdomen 04/15/2018 FINDINGS: Lower chest: Bilateral small pleural effusions. Left lower lobe airspace disease likely reflecting atelectasis. Hepatobiliary: Normal liver. No intrahepatic biliary ductal dilatation. No hepatic mass. Interval cholecystectomy. 7.6 x 10 x 17 cm fluid collection emanating from the gallbladder fossa and tracking posteriorly with a few bubbles of air within the collection which may reflect a biloma or abscess. Mild inflammatory changes along the inferior margin of the liver. Small amount of perihepatic fluid. Pancreas: Unremarkable. No pancreatic ductal dilatation or surrounding inflammatory changes. Spleen: Normal in size without focal abnormality. Adrenals/Urinary Tract: Adrenal glands are unremarkable. Kidneys are normal, without renal calculi, focal lesion, or hydronephrosis. Bladder is unremarkable. Stomach/Bowel: Stomach is within normal limits. Appendix appears normal. No evidence of bowel wall thickening, distention, or inflammatory changes. Diverticulosis without evidence of diverticulitis. Vascular/Lymphatic: Normal caliber abdominal aorta with atherosclerosis. No lymphadenopathy. Reproductive: Prostate is unremarkable. Other: No fluid collection or hematoma. Small amount of pelvic free fluid. Musculoskeletal: No acute osseous abnormality. No aggressive osseous lesion. Chronic compression fracture  of L1 vertebral body. Diffuse thoracolumbar spondylosis. IMPRESSION: 1. Interval cholecystectomy. 7.6 x 10 x 17 cm fluid collection emanating from the gallbladder fossa and tracking posteriorly with a few bubbles of air within the collection which may reflect a  biloma versus abscess. Electronically Signed   By: Elige Ko   On: 04/26/2018 17:29   Dg Chest 2 View  Result Date: 04/26/2018 CLINICAL DATA:  85 year old male with cough.  Initial encounter. EXAM: CHEST - 2 VIEW COMPARISON:  12/30/2017 CT.  12/23/2017 chest x-ray. FINDINGS: Cardiomegaly.  Pulmonary vascular congestion. Left lower lobe consolidation with pleural effusion. Left lower lobe mass as noted on prior CT of indeterminate etiology. Right lower lobe nodule and scattered granulomas better delineated on prior CT. Calcified aorta. No obvious acute osseous abnormality although evaluation limited. Prior compression fracture L1. IMPRESSION: 1. Left lower lobe consolidation with pleural effusion. 2. Left lower lobe mass as noted on prior CT of indeterminate etiology. 3. Cardiomegaly.  Pulmonary vascular congestion. 4. Right lower lobe nodule and scattered granulomas better delineated on prior CT. 5.  Aortic Atherosclerosis (ICD10-I70.0). Electronically Signed   By: Lacy Duverney M.D.   On: 04/26/2018 18:11   Dg Chest Port 1 View  Result Date: 04/29/2018 CLINICAL DATA:  Intubation. EXAM: PORTABLE CHEST 1 VIEW COMPARISON:  04/28/2018. FINDINGS: Endotracheal tube, left IJ line, NG tube in stable position. Cardiomegaly. Left base infiltrate with small left pleural effusion. Interim progression from prior exam. No pneumothorax. IMPRESSION: 1.  Lines and tubes in stable position. 2. Left base infiltrate consistent with pneumonia. Left-sided pleural effusion. Interim progression of findings from prior exam. Electronically Signed   By: Maisie Fus  Register   On: 04/29/2018 06:09   Dg Chest Port 1 View  Result Date: 04/28/2018 CLINICAL DATA:  Hypoxia EXAM: PORTABLE CHEST 1 VIEW COMPARISON:  April 27, 2018 FINDINGS: Endotracheal tube tip is 4.4 cm above the carina. Central catheter tip is in the superior vena cava near the cavoatrial junction. Nasogastric tube tip and side port are below the diaphragm. No  pneumothorax. There is persistent left lower lobe airspace consolidation with small left pleural effusion. The right lung is clear. There is cardiomegaly with pulmonary vascularity normal. No adenopathy. There is aortic atherosclerosis. There is degenerative change in each shoulder. IMPRESSION: Tube and catheter positions as described without pneumothorax. Persistent left lower lobe consolidation, likely pneumonia, with small left pleural effusion. Right lung clear. Stable cardiac prominence. Aortic Atherosclerosis (ICD10-I70.0). Electronically Signed   By: Bretta Bang III M.D.   On: 04/28/2018 07:08   Dg Chest Port 1 View  Result Date: 04/27/2018 CLINICAL DATA:  Respiratory failure EXAM: PORTABLE CHEST 1 VIEW COMPARISON:  Film from earlier in the same day. FINDINGS: Cardiac shadow remains enlarged. Endotracheal tube is now seen with the tip approximately 1.4 cm deep within the right mainstem bronchus. This should be withdrawn several cm. Gastric catheter is noted within the stomach. Left jugular central line is seen with the tip in the distal superior vena cava. No pneumothorax is noted. Increasing left basilar atelectasis is noted. IMPRESSION: Tubes and lines as described above. The endotracheal tube should be withdrawn approximately 3 cm. Increasing left basilar consolidation. These results will be called to the ordering clinician or representative by the Radiologist Assistant, and communication documented in the PACS or zVision Dashboard. Electronically Signed   By: Alcide Clever M.D.   On: 04/27/2018 17:04   Dg Chest Port 1 View  Result Date: 04/27/2018 CLINICAL DATA:  85 year old male with shortness  of breath, tachypnea. EXAM: PORTABLE CHEST 1 VIEW COMPARISON:  Chest radiographs yesterday, and November 2019. CT Abdomen and Pelvis yesterday. FINDINGS: Portable AP semi upright view at 1335 hours. Continued low lung volumes. Stable cardiac size and mediastinal contours. Visualized tracheal air column  is within normal limits. Continued bilateral increased pulmonary interstitial markings and confluent retrocardiac opacity. No pneumothorax. Continued small pleural effusions suspected. Negative visible bowel gas pattern. IMPRESSION: Continued low lung volumes with left lower lobe consolidation suspicious for Pneumonia and small pleural effusions. Electronically Signed   By: Odessa Fleming M.D.   On: 04/27/2018 13:55   Dg Abd Portable 1v  Result Date: 04/27/2018 CLINICAL DATA:  Abdominal pain EXAM: PORTABLE ABDOMEN - 1 VIEW COMPARISON:  04/26/2018 FINDINGS: Scattered large and small bowel gas is noted. No obstructive changes are seen. Gastric catheter is noted within the stomach. No acute bony abnormality is seen. IMPRESSION: No acute abnormality is noted. Electronically Signed   By: Alcide Clever M.D.   On: 04/27/2018 17:02   Ct Image Guided Fluid Drain By Catheter  Result Date: 04/28/2018 CLINICAL DATA:  Subhepatic collection post cholecystectomy, hemodynamic instability, drainage requested EXAM: CT GUIDED DRAINAGE OF PERITONEAL ABSCESS ANESTHESIA/SEDATION: lidocaine 1% subcutaneous PROCEDURE: The procedure, risks, benefits, and alternatives were explained to the patient. Questions regarding the procedure were encouraged and answered. The patient understands and consents to the procedure. Select axial scans through the upper abdomen obtained. The collection was localized and an appropriate skin entry site was determined and marked. The operative field was prepped with chlorhexidinein a sterile fashion, and a sterile drape was applied covering the operative field. A sterile gown and sterile gloves were used for the procedure. Local anesthesia was provided with 1% Lidocaine. Under CT fluoroscopic guidance, 18 gauge percutaneous entry needle advanced into the collection. Bilious fluid returned. Amplatz wire advanced easily, position confirmed on CT. Tract dilated to facilitate placement of a 12 French pigtail drain  catheter, placed within the central dependent aspect of the collection. Catheter secured externally with 0 Prolene suture and StatLock and placed to gravity drain bag. 30 mL aspirate was sent for Gram stain and culture. The patient tolerated the procedure well. COMPLICATIONS: None immediate FINDINGS: Subhepatic fluid collection was localized. 12 French drain catheter placed as above. Sample of the bilious aspirate sent for Gram stain and culture. IMPRESSION: 1. Technically successful CT-guided right upper quadrant abscess drain catheter placement. Electronically Signed   By: Corlis Leak M.D.   On: 04/28/2018 09:13   Nm Hepatobiliary Including Gb  Result Date: 04/28/2018 CLINICAL DATA:  85 year old post laparoscopic cholecystectomy 04/16/2018 followed by ERCP with biliary sphincterotomy. Abdominal pain, fever and subhepatic fluid collection on CT. Evaluate for biliary leak. EXAM: NUCLEAR MEDICINE HEPATOBILIARY IMAGING TECHNIQUE: Sequential images of the abdomen were obtained out to 60 minutes following intravenous administration of radiopharmaceutical. RADIOPHARMACEUTICALS:  5.3 mCi Tc-23m  Choletec IV COMPARISON:  Abdominopelvic CT 04/26/2018. FINDINGS: There is prompt uptake in biliary excretion of activity. There is prompt drainage into the biliary system and small bowel. There is progressive accumulation of activity in the subhepatic space, corresponding with the fluid collection on recent CT and consistent with a contained bile leak. No free peritoneal activity identified. IMPRESSION: Progressive accumulation of activity in the subhepatic space consistent with a contained bile leak. No evidence of biliary obstruction. Electronically Signed   By: Carey Bullocks M.D.   On: 04/28/2018 16:04    Labs:  CBC: Recent Labs    04/27/18 1319 04/27/18 1657 04/28/18 0500  04/29/18 0501 04/30/18 0417  WBC 19.2*  --  16.0* 15.4* 14.1*  HGB 10.6* 9.5* 7.3* 7.5* 7.3*  HCT 31.6* 28.0* 22.8* 24.8* 23.8*  PLT 365   --  303 311 302    COAGS: Recent Labs    04/27/18 1725 04/28/18 0500 04/29/18 0501 04/30/18 0417  INR 1.7* 2.1* 1.7* 1.7*    BMP: Recent Labs    04/27/18 1319 04/27/18 1657 04/28/18 0500 04/28/18 2345 04/30/18 0000 04/30/18 0417  NA 139 140 142 141 142  --   K 4.3 3.9 3.7 4.7 5.1  --   CL 108  --  110 115* 119*  --   CO2 19*  --  22 16* 20*  --   GLUCOSE 120*  --  105* 93 132*  --   BUN 30*  --  39* 46* 54*  --   CALCIUM 7.2*  --  7.4* 7.0* 7.1*  --   CREATININE 2.60*  --  2.82* 2.86* 2.99* 3.02*  GFRNONAA 21*  --  19* 19* 18* 17*  GFRAA 24*  --  22* 21* 20* 20*    LIVER FUNCTION TESTS: Recent Labs    04/27/18 0336 04/27/18 1319 04/28/18 0500 04/29/18 0501  BILITOT 2.1* 1.8* 1.6* 1.4*  AST 50* 47* 44* 89*  ALT 45* 45* 36 45*  ALKPHOS 120 102 139* 84  PROT 5.4* 5.1* 5.4* 5.1*  ALBUMIN 1.9* 1.7* 2.0* 1.8*    Assessment and Plan:  Subhepatic drain placed 3/17 OP bile Will follow  Electronically Signed: Robet Leu, PA-C 04/30/2018, 9:48 AM   I spent a total of 15 Minutes at the the patient's bedside AND on the patient's hospital floor or unit, greater than 50% of which was counseling/coordinating care for biloma drain

## 2018-04-30 NOTE — Progress Notes (Signed)
Pt returned from procedure and placed back on full vent support.

## 2018-04-30 NOTE — Brief Op Note (Signed)
04/26/2018 - 04/30/2018  12:56 PM  PATIENT:  Crisoforo Oxford  85 y.o. male  PRE-OPERATIVE DIAGNOSIS:  Bile leak  POST-OPERATIVE DIAGNOSIS:  BILE LEAK S/P STENT PLACEMENT  PROCEDURE:  Procedure(s): ENDOSCOPIC RETROGRADE CHOLANGIOPANCREATOGRAPHY (ERCP) (N/A) BILIARY STENT PLACEMENT  SURGEON:  Surgeon(s) and Role:    Ronnette Juniper, MD - Primary  PHYSICIAN ASSISTANT:   ASSISTANTS: Burtis Junes, RN, Janie Billups, Tech   ANESTHESIA:   MAC  EBL:  Moderate  BLOOD ADMINISTERED:none  DRAINS: none   LOCAL MEDICATIONS USED:  NONE  SPECIMEN:  No Specimen  DISPOSITION OF SPECIMEN:  N/A  COUNTS:  YES  TOURNIQUET:  * No tourniquets in log *  DICTATION: .Dragon Dictation  PLAN OF CARE: Admit to inpatient   PATIENT DISPOSITION:  PACU - hemodynamically stable.   Delay start of Pharmacological VTE agent (>24hrs) due to surgical blood loss or risk of bleeding: yes

## 2018-04-30 NOTE — Progress Notes (Signed)
Pt had 10 beat run of vtach. VSS. MD ramaswamy updated. No further orders at this time

## 2018-04-30 NOTE — Progress Notes (Signed)
Wasted fentanyl 150 mL witnessed by Victorino Dike, Charity fundraiser.

## 2018-04-30 NOTE — Op Note (Signed)
[facility] Patient Name: Vincent Barrett Procedure Date : 04/30/2018 MRN: [id number] Attending MD: Kerin Salen , MD Date of Birth: [birth year] CSN: [id number] Age: 85 Admit Type: Inpatient Procedure:                ERCP Indications:              Bile leak Providers:                Kerin Salen, MD, Norman Clay, RN, Beryle Beams,                            Technician Referring MD:              Medicines:                Monitored Anesthesia Care Complications:            Moderate bleeding, no further bleeding noted post                            stent placement. Estimated Blood Loss:     Estimated blood loss:moderate Procedure:                Pre-Anesthesia Assessment:                           - Prior to the procedure, a History and Physical                            was performed, and patient medications and                            allergies were reviewed. The patient's tolerance of                            previous anesthesia was also reviewed. The risks                            and benefits of the procedure and the sedation                            options and risks were discussed with the patient.                            All questions were answered, and informed consent                            was obtained. Prior Anticoagulants: The patient has                            taken Eliquis (apixaban), last dose was 4 days                            prior to procedure. ASA Grade Assessment: IV - A                            patient with  severe systemic disease that is a                            constant threat to life. After reviewing the risks                            and benefits, the patient was deemed in                            satisfactory condition to undergo the procedure.                           After obtaining informed consent, the scope was                            passed under direct vision. Throughout the   procedure, the patient's blood pressure, pulse, and                            oxygen saturations were monitored continuously. The                            [id number]-[id number] ([id number]) Olympus Duodensocope was                            introduced through the mouth, and used to inject                            contrast into and used to inject contrast into the                            bile duct. The ERCP was accomplished without                            difficulty. The patient tolerated the procedure                            well. Scope In: Scope Out: Findings:      The scout film was normal. The esophagus was successfully intubated       under direct vision. The scope was advanced to a normal major papilla in       the descending duodenum without detailed examination of the pharynx,       larynx and associated structures, and upper GI tract. The upper GI tract       was grossly normal.      There was evidence of prior sphincterotomy.      The wire initially was advanced into the pancreatic duct and after       withdrawl, oozing was noted from the ampulla. A clot was retrieved on       withdrawl of the sphinctertome.      The bile duct was deeply cannulated with the sphincterotome. Contrast       was injected. I personally interpreted the bile duct images. There was       brisk flow of contrast through the ducts. Image quality was excellent.  Contrast extended to the entire biliary tree. The main bile duct was       normal. A straight Roadrunner wire was passed into the biliary tree.      There was continued ooze and bleeding noted through the ampulla. At this       point, instead of a performing an occlusive cholangiogram, a stent ws       deployed to control bleeding.      One 10 Fr by 9 cm plastic stent with a single external flap and a single       internal flap was placed 8.5 cm into the common bile duct. The stent was       in good position. The proximal tip of the stent was  at the level of       bifurcation. Impression:               -Bleeding noted through the ampulla, clot was                            retrived on withdrawl of the sphinctertome.                           - One plastic stent was placed into the common bile                            duct. Recommendation:           - NPO.                           - Vitamin K 10 mg IV 1 dose and FFP 1 unit.                           - Monitor H and H and transfuse as needed.                           - Follow LFTs in am.                           - Follow output from JP drain. Procedure Code(s):        --- Professional ---                           [phone], Endoscopic retrograde                            cholangiopancreatography (ERCP); with placement of                            endoscopic stent into biliary or pancreatic duct,                            including pre- and post-dilation and guide wire                            passage, when performed, including sphincterotomy,  when performed, each stent Diagnosis Code(s):        --- Professional ---                           K83.8, Other specified diseases of biliary tract CPT copyright 2018 American [employer] Association. All rights reserved. The codes documented in this report are preliminary and upon coder review may  be revised to meet current compliance requirements. Kerin Salen, MD 04/30/2018 12:56:36 PM This report has been signed electronically. Number of Addenda: 0

## 2018-04-30 NOTE — Progress Notes (Signed)
1 unit of FFP started by endoscopy. Upon arrival, patient finished 1 unit of FFP. MD Ramaswamy updated. Verbalized that 1 unit of ffp is fine. No further orders at this time.

## 2018-04-30 NOTE — Anesthesia Preprocedure Evaluation (Addendum)
Anesthesia Evaluation  Patient identified by MRN, date of birth, ID band Patient unresponsive    Reviewed: Allergy & Precautions, NPO status , Patient's Chart, lab work & pertinent test results, Unable to perform ROS - Chart review only  Airway Mallampati: Intubated       Dental   Unable to assess:   Pulmonary COPD, former smoker,    + rhonchi  + decreased breath sounds      Cardiovascular hypertension, Pt. on medications and Pt. on home beta blockers + dysrhythmias (on eliquis) Atrial Fibrillation  Rhythm:Irregular Rate:Normal     Neuro/Psych negative neurological ROS  negative psych ROS   GI/Hepatic negative GI ROS, Neg liver ROS,   Endo/Other  negative endocrine ROS  Renal/GU negative Renal ROS  negative genitourinary   Musculoskeletal negative musculoskeletal ROS (+)   Abdominal   Peds  Hematology  (+) Blood dyscrasia (Hgb 7.3), anemia ,   Anesthesia Other Findings S/p cholecystectomy 04/16/18. Course c/b bile leak now in ICU intubated for pneumonia.   Reproductive/Obstetrics                           Anesthesia Physical Anesthesia Plan  ASA: III  Anesthesia Plan: General   Post-op Pain Management:    Induction: Inhalational  PONV Risk Score and Plan: 2 and Treatment may vary due to age or medical condition and Ondansetron  Airway Management Planned: Oral ETT  Additional Equipment:   Intra-op Plan:   Post-operative Plan: Post-operative intubation/ventilation  Informed Consent: I have reviewed the patients History and Physical, chart, labs and discussed the procedure including the risks, benefits and alternatives for the proposed anesthesia with the patient or authorized representative who has indicated his/her understanding and acceptance.     Dental advisory given  Plan Discussed with: CRNA  Anesthesia Plan Comments:         Anesthesia Quick Evaluation

## 2018-04-30 NOTE — Progress Notes (Signed)
Day of Surgery   Subjective/Chief Complaint: No acute change. Has not been able to wean much on vent due to encephalopathy    Objective: Vital signs in last 24 hours: Temp:  [97.8 F (36.6 C)-99.1 F (37.3 C)] 98.5 F (36.9 C) (03/20 0745) Pulse Rate:  [71-109] 78 (03/20 0745) Resp:  [11-26] 22 (03/20 0745) BP: (89-123)/(30-94) 107/67 (03/20 0745) SpO2:  [95 %-100 %] 100 % (03/20 0745) FiO2 (%):  [30 %] 30 % (03/20 0745) Last BM Date: 04/27/18  Intake/Output from previous day: 03/19 0701 - 03/20 0700 In: 2837.1 [I.V.:2374.9; IV Piggyback:462.2] Out: 620 [Urine:410; Emesis/NG output:50; Drains:160] Intake/Output this shift: Total I/O In: 99.8 [I.V.:99.8] Out: -   General appearance: ill-appearing Resp: on vent, PRVC, peak pressures 28-30 Cardio: regular, 70s. 2+ pitting edema BLE GI: soft, more distended today but slightly less tender. Extremities: no deformity, diffuse edema Skin: Skin color, texture, turgor normal. No rashes or lesions Neurologic:  fent gt @ 225 (decreased) Incision/Wound: incisions are all healing without sequelae. Right upper quadrant drain with bilious output, amt down 160cc GU: severe penoscrotal edema, foley in place  Lab Results:  Recent Labs    04/29/18 0501 04/30/18 0417  WBC 15.4* 14.1*  HGB 7.5* 7.3*  HCT 24.8* 23.8*  PLT 311 302   BMET Recent Labs    04/28/18 2345 04/30/18 0000 04/30/18 0417  NA 141 142  --   K 4.7 5.1  --   CL 115* 119*  --   CO2 16* 20*  --   GLUCOSE 93 132*  --   BUN 46* 54*  --   CREATININE 2.86* 2.99* 3.02*  CALCIUM 7.0* 7.1*  --    PT/INR Recent Labs    04/29/18 0501 04/30/18 0417  LABPROT 19.8* 19.4*  INR 1.7* 1.7*   ABG Recent Labs    04/27/18 1315 04/27/18 1657  PHART 7.422 7.403  HCO3 19.1* 20.7    Studies/Results: Dg Chest Port 1 View  Result Date: 04/29/2018 CLINICAL DATA:  Intubation. EXAM: PORTABLE CHEST 1 VIEW COMPARISON:  04/28/2018. FINDINGS: Endotracheal tube, left IJ  line, NG tube in stable position. Cardiomegaly. Left base infiltrate with small left pleural effusion. Interim progression from prior exam. No pneumothorax. IMPRESSION: 1.  Lines and tubes in stable position. 2. Left base infiltrate consistent with pneumonia. Left-sided pleural effusion. Interim progression of findings from prior exam. Electronically Signed   By: Maisie Fus  Register   On: 04/29/2018 06:09   Nm Hepatobiliary Including Gb  Result Date: 04/28/2018 CLINICAL DATA:  85 year old post laparoscopic cholecystectomy 04/16/2018 followed by ERCP with biliary sphincterotomy. Abdominal pain, fever and subhepatic fluid collection on CT. Evaluate for biliary leak. EXAM: NUCLEAR MEDICINE HEPATOBILIARY IMAGING TECHNIQUE: Sequential images of the abdomen were obtained out to 60 minutes following intravenous administration of radiopharmaceutical. RADIOPHARMACEUTICALS:  5.3 mCi Tc-63m  Choletec IV COMPARISON:  Abdominopelvic CT 04/26/2018. FINDINGS: There is prompt uptake in biliary excretion of activity. There is prompt drainage into the biliary system and small bowel. There is progressive accumulation of activity in the subhepatic space, corresponding with the fluid collection on recent CT and consistent with a contained bile leak. No free peritoneal activity identified. IMPRESSION: Progressive accumulation of activity in the subhepatic space consistent with a contained bile leak. No evidence of biliary obstruction. Electronically Signed   By: Carey Bullocks M.D.   On: 04/28/2018 16:04    Anti-infectives: Anti-infectives (From admission, onward)   Start     Dose/Rate Route Frequency Ordered Stop  04/27/18 1800  vancomycin (VANCOCIN) IVPB 750 mg/150 ml premix  Status:  Discontinued     750 mg 150 mL/hr over 60 Minutes Intravenous Every 24 hours 04/26/18 1634 04/27/18 0730   04/27/18 1600  ceFEPIme (MAXIPIME) 2 g in sodium chloride 0.9 % 100 mL IVPB  Status:  Discontinued     2 g 200 mL/hr over 30 Minutes  Intravenous Every 24 hours 04/26/18 1629 04/27/18 1412   04/27/18 1600  ceFEPIme (MAXIPIME) 1 g in sodium chloride 0.9 % 100 mL IVPB     1 g 200 mL/hr over 30 Minutes Intravenous Every 24 hours 04/27/18 1412     04/26/18 2359  metroNIDAZOLE (FLAGYL) IVPB 500 mg     500 mg 100 mL/hr over 60 Minutes Intravenous Every 8 hours 04/26/18 2325     04/26/18 1700  vancomycin (VANCOCIN) 1,500 mg in sodium chloride 0.9 % 500 mL IVPB     1,500 mg 250 mL/hr over 120 Minutes Intravenous  Once 04/26/18 1557 04/26/18 2059   04/26/18 1600  ceFEPIme (MAXIPIME) 2 g in sodium chloride 0.9 % 100 mL IVPB     2 g 200 mL/hr over 30 Minutes Intravenous  Once 04/26/18 1553 04/26/18 1650   04/26/18 1600  metroNIDAZOLE (FLAGYL) IVPB 500 mg     500 mg 100 mL/hr over 60 Minutes Intravenous  Once 04/26/18 1553 04/26/18 1830   04/26/18 1600  vancomycin (VANCOCIN) IVPB 1000 mg/200 mL premix  Status:  Discontinued     1,000 mg 200 mL/hr over 60 Minutes Intravenous  Once 04/26/18 1553 04/26/18 1557      Assessment/Plan: Status post laparoscopic cholecystectomy 04/16/2018 by Dr. Corliss Skains Gallbladder fossa abscess- IV ABX,drain inserted with bilious output. GI following. Plans for ERCP and stent today   PNA, Acute kidney injury- per primary. Respiratory failure likely secondary to pneumonia. Worsening on last CXR Coagulopathy: likely more reflective of liver dysfunction then medication effect as he was on Eliquis, inr stable 1/.7  Continue supportive care, antibiotics. 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 Plans for ERCP today. Will continue to follow.         LOS: 4 days    Berna Bue 04/30/2018

## 2018-04-30 NOTE — Progress Notes (Signed)
Pt taken off vent to transport to procedure area using an ambu bag by CRNA.

## 2018-05-01 ENCOUNTER — Inpatient Hospital Stay (HOSPITAL_COMMUNITY): Payer: Medicare Other

## 2018-05-01 ENCOUNTER — Encounter (HOSPITAL_COMMUNITY): Payer: Self-pay | Admitting: Gastroenterology

## 2018-05-01 DIAGNOSIS — N179 Acute kidney failure, unspecified: Secondary | ICD-10-CM

## 2018-05-01 LAB — CBC WITH DIFFERENTIAL/PLATELET
ABS IMMATURE GRANULOCYTES: 0.1 10*3/uL — AB (ref 0.00–0.07)
Basophils Absolute: 0 10*3/uL (ref 0.0–0.1)
Basophils Relative: 0 %
Eosinophils Absolute: 0.3 10*3/uL (ref 0.0–0.5)
Eosinophils Relative: 3 %
HCT: 22.1 % — ABNORMAL LOW (ref 39.0–52.0)
Hemoglobin: 6.8 g/dL — CL (ref 13.0–17.0)
Immature Granulocytes: 1 %
Lymphocytes Relative: 10 %
Lymphs Abs: 1.1 10*3/uL (ref 0.7–4.0)
MCH: 30.4 pg (ref 26.0–34.0)
MCHC: 30.8 g/dL (ref 30.0–36.0)
MCV: 98.7 fL (ref 80.0–100.0)
Monocytes Absolute: 1.1 10*3/uL — ABNORMAL HIGH (ref 0.1–1.0)
Monocytes Relative: 9 %
NEUTROS ABS: 9.2 10*3/uL — AB (ref 1.7–7.7)
NEUTROS PCT: 77 %
Platelets: 284 10*3/uL (ref 150–400)
RBC: 2.24 MIL/uL — ABNORMAL LOW (ref 4.22–5.81)
RDW: 13.6 % (ref 11.5–15.5)
WBC: 11.9 10*3/uL — ABNORMAL HIGH (ref 4.0–10.5)
nRBC: 0 % (ref 0.0–0.2)

## 2018-05-01 LAB — PREPARE FRESH FROZEN PLASMA: Unit division: 0

## 2018-05-01 LAB — HEPATIC FUNCTION PANEL
ALT: 28 U/L (ref 0–44)
AST: 36 U/L (ref 15–41)
Albumin: 1.8 g/dL — ABNORMAL LOW (ref 3.5–5.0)
Alkaline Phosphatase: 72 U/L (ref 38–126)
Bilirubin, Direct: 0.3 mg/dL — ABNORMAL HIGH (ref 0.0–0.2)
Indirect Bilirubin: 0.5 mg/dL (ref 0.3–0.9)
TOTAL PROTEIN: 5 g/dL — AB (ref 6.5–8.1)
Total Bilirubin: 0.8 mg/dL (ref 0.3–1.2)

## 2018-05-01 LAB — GLUCOSE, CAPILLARY
GLUCOSE-CAPILLARY: 115 mg/dL — AB (ref 70–99)
GLUCOSE-CAPILLARY: 108 mg/dL — AB (ref 70–99)
GLUCOSE-CAPILLARY: 108 mg/dL — AB (ref 70–99)
Glucose-Capillary: 126 mg/dL — ABNORMAL HIGH (ref 70–99)
Glucose-Capillary: 136 mg/dL — ABNORMAL HIGH (ref 70–99)
Glucose-Capillary: 89 mg/dL (ref 70–99)
Glucose-Capillary: 95 mg/dL (ref 70–99)

## 2018-05-01 LAB — BPAM FFP
Blood Product Expiration Date: 202003222359
ISSUE DATE / TIME: 202003201242
Unit Type and Rh: 8400

## 2018-05-01 LAB — CULTURE, BLOOD (ROUTINE X 2)
Culture: NO GROWTH
Culture: NO GROWTH
Special Requests: ADEQUATE
Special Requests: ADEQUATE

## 2018-05-01 LAB — MAGNESIUM: Magnesium: 2.1 mg/dL (ref 1.7–2.4)

## 2018-05-01 LAB — HEMOGLOBIN AND HEMATOCRIT, BLOOD
HCT: 26.9 % — ABNORMAL LOW (ref 39.0–52.0)
Hemoglobin: 8.6 g/dL — ABNORMAL LOW (ref 13.0–17.0)

## 2018-05-01 LAB — PHOSPHORUS: Phosphorus: 2.6 mg/dL (ref 2.5–4.6)

## 2018-05-01 MED ORDER — SODIUM CHLORIDE 0.9% IV SOLUTION: Freq: Once | INTRAVENOUS | Status: DC

## 2018-05-01 MED ORDER — VITAL HIGH PROTEIN PO LIQD: 1000.0000 mL | Freq: Every day | ORAL | Status: DC

## 2018-05-01 MED ORDER — VITAL AF 1.2 CAL PO LIQD
1000.0000 mL | ORAL | Status: DC
  Administered 2018-05-01: 1000 mL

## 2018-05-01 MED ORDER — FUROSEMIDE 10 MG/ML IJ SOLN
80.0000 mg | Freq: Once | INTRAMUSCULAR | Status: AC
  Administered 2018-05-01: 80 mg via INTRAVENOUS
  Filled 2018-05-01: qty 8

## 2018-05-01 NOTE — Progress Notes (Addendum)
CRITICAL VALUE ALERT  Critical Value:  hgb-6.8  Date & Time Notied:  05/01/2018 @ 0500  Provider Notified: Dr. Katrinka Blazing   Orders Received/Actions taken: Transfuse 1 unit PRBCs

## 2018-05-01 NOTE — Progress Notes (Signed)
Nutrition Follow-up  INTERVENTION:   Trickle TF via OG tube: - Vital AF 1.2 @ 20 ml/hr (480 ml/day)  Trickle tube feeding regimen provides 576 kcal, 36 grams of protein, and 389 ml of H2O (34% of kcal needs, 36% of protein needs).  Goal TF regimen: - Vital AF 1.2 @ 60 ml/hr (1440 ml/day)  Goal tube feeding regimen provides 1728 kcal, 108 grams of protein, and 1166 ml of H2O (103% of kcal needs, 100% of protein needs).  Monitor magnesium, potassium, and phosphorus daily for at least 3 days, MD to replete as needed, as pt is at risk for refeeding syndrome given NPO/CLD since admission.  If pt unable to tolerate TF, recommend TPN.  NUTRITION DIAGNOSIS:   Inadequate oral intake related to acute illness as evidenced by NPO status.  Ongoing  GOAL:   Patient will meet greater than or equal to 90% of their needs  Unmet, trickle TF only at this time  MONITOR:   Vent status, Labs, Weight trends, Skin  REASON FOR ASSESSMENT:   Consult Enteral/tube feeding initiation and management  ASSESSMENT:   85 yo male admitted with abdominal abscess/biloma, pneumonia, LLL lung mass. Pt with recent admission from 04/14/18 to 04/23/18 for acute calculus cholecystitis and choledocholithiasis and underwent ERCP with sphinerotomy/CBD stone removal. PMH includes COPD, HTN.  3/06 - lap cholecystectomy 3/12 - ERCP with sphinerotomy and CBD stone removal 3/13 - discharge 3/16 - admit 3/17 - RUQ biliary drain, intubation 3/18 - HIDA scan revealed bile leak at subhepatic space 3/20 - ERCP with biliary stent placement  Per surgery note this AM, okay to start trophic feeds if abdominal x-ray does not show ileus. Per x-ray reading, "no acute findings, nonobstructive bowel gas pattern." RD consulted to start trophic feeds.  Levophed off this AM.  Weight up 27 lbs since admit. Suspect this is related to positive fluid balance and is not true weight gain. Will use admission weight of 79.5 kg to estimate  pt's needs.  Noted OG tube has been retracted to the level of the midthoracic esophagus. Recommend advancing to the stomach. Discussed this with RN who reports attempting to advance it. Repeat abdominal x-ray ordered. Per repeat x-ray, OG tube tip is in the third portion of the duodenum.  If pt unable to tolerate tube feeds, recommend considering TPN.  Current TF order: Vital High Protein @ 20 ml/hr  Patient is currently intubated on ventilator support MVe: 11.1 L/min Temp (24hrs), Avg:98 F (36.7 C), Min:97.3 F (36.3 C), Max:98.8 F (37.1 C) BP: 105/69 MAP: 81  Propofol: none Fentanyl: 2 ml/hr NS: 10 ml/hr  Medications reviewed and include: SSI q 4 hours, Protonix, IV antibiotics  Labs reviewed: BUN 51 (H), creatinine 2.89 (H), hemoglobin 6.8 (L) CBG's: 115, 126, 136, 119, 121 x 24 hours K+, mag, phos all WNL  UOP: 483 ml x 24 hours OGT output: 30 ml x 24 hours RUQ drain: 115 ml x 24 hours I/O's: +16.9 L since admit  Diet Order:   Diet Order            Diet NPO time specified  Diet effective midnight              EDUCATION NEEDS:   Not appropriate for education at this time  Skin:  Skin Assessment: Skin Integrity Issues: Stage I: coccyx Incisions: abdomen  Last BM:  3/17  Height:   Ht Readings from Last 1 Encounters:  04/27/18 5\' 6"  (1.676 m)    Weight:  Wt Readings from Last 1 Encounters:  05/01/18 91.8 kg    Ideal Body Weight:     BMI:  Body mass index is 32.67 kg/m.  Estimated Nutritional Needs:   Kcal:  1676  Protein:  100-125 g  Fluid:  >/= 1.7 L    Earma Reading, MS, RD, LDN Inpatient Clinical Dietitian Pager: [phone] Weekend/After Hours: [phone]

## 2018-05-01 NOTE — Progress Notes (Signed)
Referring Physician(s): Ramaswamy,M  Supervising Physician: Oley Balm  Patient Status:  [facility] - In-pt  Chief Complaint:  biloma  Subjective: Intubated; eyes open but fixed stare to ceiling; off pressors   Allergies: Quinine  Medications: Prior to Admission medications   Medication Sig Start Date End Date Taking? Authorizing Provider  amLODipine (NORVASC) 2.5 MG tablet Take 1 tablet (2.5 mg total) by mouth daily. 02/21/16  Yes Hilty, Lisette Abu, MD  Ascorbic Acid (VITAMIN C) 1000 MG tablet Take 1,000 mg by mouth daily.   Yes [provider]  doxazosin (CARDURA) 4 MG tablet Take 1 tablet (4 mg total) by mouth daily. KEEP OV. 01/16/17  Yes Hilty, Lisette Abu, MD  ELIQUIS 5 MG TABS tablet TAKE 1 TABLET BY MOUTH TWICE A DAY 04/26/18  Yes Hilty, Lisette Abu, MD  losartan-hydrochlorothiazide (HYZAAR) 100-25 MG tablet Take 1 tablet by mouth daily. 01/18/18  Yes Hilty, Lisette Abu, MD  metoprolol succinate (TOPROL-XL) 25 MG 24 hr tablet TAKE 1 TABLET (25 MG TOTAL) BY MOUTH DAILY. 04/13/17  Yes Hilty, Lisette Abu, MD  Multiple Vitamins-Minerals (PRESERVISION AREDS 2) CAPS Take 1 capsule by mouth daily.   Yes [provider]  ondansetron (ZOFRAN) 4 MG tablet Take 1 tablet (4 mg total) by mouth every 6 (six) hours as needed for nausea. 04/23/18  Yes Glade Lloyd, MD  traMADol (ULTRAM) 50 MG tablet Take 1 tablet (50 mg total) by mouth every 6 (six) hours as needed for severe pain. 04/23/18 04/23/19 Yes Glade Lloyd, MD     Vital Signs: BP 110/68    Pulse 78    Temp 98.5 F (36.9 C) (Oral)    Resp (!) 22    Ht  (1.676 m)    Wt 202 lb 6.1 oz (91.8 kg)    SpO2 98%    BMI 32.67 kg/m   Physical Exam RUQ drain intact, insertion site ok, output 115 cc gold colored bile; flushes ok  Imaging: Dg Chest Port 1 View  Result Date: 04/29/2018 CLINICAL DATA:  Intubation. EXAM: PORTABLE CHEST 1 VIEW COMPARISON:  04/28/2018. FINDINGS: Endotracheal tube, left IJ line, NG tube in stable  position. Cardiomegaly. Left base infiltrate with small left pleural effusion. Interim progression from prior exam. No pneumothorax. IMPRESSION: 1.  Lines and tubes in stable position. 2. Left base infiltrate consistent with pneumonia. Left-sided pleural effusion. Interim progression of findings from prior exam. Electronically Signed   By: Maisie Fus  Register   On: 04/29/2018 06:09   Dg Chest Port 1 View  Result Date: 04/28/2018 CLINICAL DATA:  Hypoxia EXAM: PORTABLE CHEST 1 VIEW COMPARISON:  April 27, 2018 FINDINGS: Endotracheal tube tip is 4.4 cm above the carina. Central catheter tip is in the superior vena cava near the cavoatrial junction. Nasogastric tube tip and side port are below the diaphragm. No pneumothorax. There is persistent left lower lobe airspace consolidation with small left pleural effusion. The right lung is clear. There is cardiomegaly with pulmonary vascularity normal. No adenopathy. There is aortic atherosclerosis. There is degenerative change in each shoulder. IMPRESSION: Tube and catheter positions as described without pneumothorax. Persistent left lower lobe consolidation, likely pneumonia, with small left pleural effusion. Right lung clear. Stable cardiac prominence. Aortic Atherosclerosis (ICD10-I70.0). Electronically Signed   By: Bretta Bang III M.D.   On: 04/28/2018 07:08   Dg Chest Port 1 View  Result Date: 04/27/2018 CLINICAL DATA:  Respiratory failure EXAM: PORTABLE CHEST 1 VIEW COMPARISON:  Film from earlier in the same day.  FINDINGS: Cardiac shadow remains enlarged. Endotracheal tube is now seen with the tip approximately 1.4 cm deep within the right mainstem bronchus. This should be withdrawn several cm. Gastric catheter is noted within the stomach. Left jugular central line is seen with the tip in the distal superior vena cava. No pneumothorax is noted. Increasing left basilar atelectasis is noted. IMPRESSION: Tubes and lines as described above. The endotracheal tube  should be withdrawn approximately 3 cm. Increasing left basilar consolidation. These results will be called to the ordering clinician or representative by the Radiologist Assistant, and communication documented in the PACS or zVision Dashboard. Electronically Signed   By: Alcide Clever M.D.   On: 04/27/2018 17:04   Dg Chest Port 1 View  Result Date: 04/27/2018 CLINICAL DATA:  85 year old male with shortness of breath, tachypnea. EXAM: PORTABLE CHEST 1 VIEW COMPARISON:  Chest radiographs yesterday, and November 2019. CT Abdomen and Pelvis yesterday. FINDINGS: Portable AP semi upright view at 1335 hours. Continued low lung volumes. Stable cardiac size and mediastinal contours. Visualized tracheal air column is within normal limits. Continued bilateral increased pulmonary interstitial markings and confluent retrocardiac opacity. No pneumothorax. Continued small pleural effusions suspected. Negative visible bowel gas pattern. IMPRESSION: Continued low lung volumes with left lower lobe consolidation suspicious for Pneumonia and small pleural effusions. Electronically Signed   By: Odessa Fleming M.D.   On: 04/27/2018 13:55   Dg Ercp  Result Date: 04/30/2018 CLINICAL DATA:  Biliary stent EXAM: ERCP TECHNIQUE: Multiple spot images obtained with the fluoroscopic device and submitted for interpretation post-procedure. FLUOROSCOPY TIME:  Fluoroscopy Time:  34 seconds Radiation Exposure Index (if provided by the fluoroscopic device): Number of Acquired Spot Images: 0 COMPARISON:  None. FINDINGS: Two spot images demonstrate a biliary stent in the common bile duct. IMPRESSION: See above. These images were submitted for radiologic interpretation only. Please see the procedural report for the amount of contrast and the fluoroscopy time utilized. Electronically Signed   By: Jolaine Click M.D.   On: 04/30/2018 12:56   Dg Abd Portable 1v  Result Date: 04/27/2018 CLINICAL DATA:  Abdominal pain EXAM: PORTABLE ABDOMEN - 1 VIEW  COMPARISON:  04/26/2018 FINDINGS: Scattered large and small bowel gas is noted. No obstructive changes are seen. Gastric catheter is noted within the stomach. No acute bony abnormality is seen. IMPRESSION: No acute abnormality is noted. Electronically Signed   By: Alcide Clever M.D.   On: 04/27/2018 17:02   Ct Image Guided Fluid Drain By Catheter  Result Date: 04/28/2018 CLINICAL DATA:  Subhepatic collection post cholecystectomy, hemodynamic instability, drainage requested EXAM: CT GUIDED DRAINAGE OF PERITONEAL ABSCESS ANESTHESIA/SEDATION: lidocaine 1% subcutaneous PROCEDURE: The procedure, risks, benefits, and alternatives were explained to the patient. Questions regarding the procedure were encouraged and answered. The patient understands and consents to the procedure. Select axial scans through the upper abdomen obtained. The collection was localized and an appropriate skin entry site was determined and marked. The operative field was prepped with chlorhexidinein a sterile fashion, and a sterile drape was applied covering the operative field. A sterile gown and sterile gloves were used for the procedure. Local anesthesia was provided with 1% Lidocaine. Under CT fluoroscopic guidance, 18 gauge percutaneous entry needle advanced into the collection. Bilious fluid returned. Amplatz wire advanced easily, position confirmed on CT. Tract dilated to facilitate placement of a 12 French pigtail drain catheter, placed within the central dependent aspect of the collection. Catheter secured externally with 0 Prolene suture and StatLock and placed to gravity drain  bag. 30 mL aspirate was sent for Gram stain and culture. The patient tolerated the procedure well. COMPLICATIONS: None immediate FINDINGS: Subhepatic fluid collection was localized. 12 French drain catheter placed as above. Sample of the bilious aspirate sent for Gram stain and culture. IMPRESSION: 1. Technically successful CT-guided right upper quadrant abscess  drain catheter placement. Electronically Signed   By: Corlis Leak M.D.   On: 04/28/2018 09:13   Nm Hepatobiliary Including Gb  Result Date: 04/28/2018 CLINICAL DATA:  85 year old post laparoscopic cholecystectomy 04/16/2018 followed by ERCP with biliary sphincterotomy. Abdominal pain, fever and subhepatic fluid collection on CT. Evaluate for biliary leak. EXAM: NUCLEAR MEDICINE HEPATOBILIARY IMAGING TECHNIQUE: Sequential images of the abdomen were obtained out to 60 minutes following intravenous administration of radiopharmaceutical. RADIOPHARMACEUTICALS:  5.3 mCi Tc-72m  Choletec IV COMPARISON:  Abdominopelvic CT 04/26/2018. FINDINGS: There is prompt uptake in biliary excretion of activity. There is prompt drainage into the biliary system and small bowel. There is progressive accumulation of activity in the subhepatic space, corresponding with the fluid collection on recent CT and consistent with a contained bile leak. No free peritoneal activity identified. IMPRESSION: Progressive accumulation of activity in the subhepatic space consistent with a contained bile leak. No evidence of biliary obstruction. Electronically Signed   By: Carey Bullocks M.D.   On: 04/28/2018 16:04    Labs:  CBC: Recent Labs    04/28/18 0500 04/29/18 0501 04/30/18 0417 05/01/18 0350  WBC 16.0* 15.4* 14.1* 11.9*  HGB 7.3* 7.5* 7.3* 6.8*  HCT 22.8* 24.8* 23.8* 22.1*  PLT 303 311 302 284    COAGS: Recent Labs    04/27/18 1725 04/28/18 0500 04/29/18 0501 04/30/18 0417  INR 1.7* 2.1* 1.7* 1.7*    BMP: Recent Labs    04/28/18 0500 04/28/18 2345 04/30/18 0000 04/30/18 0417 04/30/18 1932  NA 142 141 142  --  144  K 3.7 4.7 5.1  --  4.5  CL 110 115* 119*  --  120*  CO2 22 16* 20*  --  18*  GLUCOSE 105* 93 132*  --  146*  BUN 39* 46* 54*  --  51*  CALCIUM 7.4* 7.0* 7.1*  --  7.4*  CREATININE 2.82* 2.86* 2.99* 3.02* 2.89*  GFRNONAA 19* 19* 18* 17* 18*  GFRAA 22* 21* 20* 20* 21*    LIVER FUNCTION  TESTS: Recent Labs    04/27/18 1319 04/28/18 0500 04/29/18 0501 05/01/18 0350  BILITOT 1.8* 1.6* 1.4* 0.8  AST 47* 44* 89* 36  ALT 45* 36 45* 28  ALKPHOS 102 139* 84 72  PROT 5.1* 5.4* 5.1* 5.0*  ALBUMIN 1.7* 2.0* 1.8* 1.8*    Assessment and Plan: Pt s/p lap cholecystectomy 3/6 with subsequent bile leak; s/p GB fossa drain 3/17, ERCP/CBD stent 3/20; afebrile; t bili nl; WBC 11.9(14.1), hgb 6.8(7.3)- receiving transfusion; fluid cx- serratia; cont drain until output minimal; additional plans as per CCS/GI/CCM.   Electronically Signed: D. Jeananne Rama, PA-C 05/01/2018, 10:39 AM   I spent a total of 15 minutes at the the patient's bedside AND on the patient's hospital floor or unit, greater than 50% of which was counseling/coordinating care for abdominal abscess/biloma drain    Patient ID: Vincent Barrett, male   DOB: 07/12/1925, 85 y.o.   MRN: [id number]

## 2018-05-01 NOTE — Progress Notes (Signed)
1 Day Post-Op   Subjective/Chief Complaint: ERCP yesterday. Had some oozing during procedure - got FFP and vit k; stent placed, not able to do cholangiogram; off Levo   Objective: Vital signs in last 24 hours: Temp:  [97.7 F (36.5 C)-98.8 F (37.1 C)] 98 F (36.7 C) (03/21 0837) Pulse Rate:  [55-100] 77 (03/21 0945) Resp:  [19-23] 22 (03/21 0945) BP: (85-121)/(48-79) 102/66 (03/21 0945) SpO2:  [92 %-100 %] 99 % (03/21 0945) FiO2 (%):  [30 %] 30 % (03/21 0837) Weight:  [91.8 kg] 91.8 kg (03/21 0200) Last BM Date: 04/27/18  Intake/Output from previous day: 03/20 0701 - 03/21 0700 In: 4401.5 [I.V.:2102.1; Blood:258; NG/GT:60; IV Piggyback:1976.4] Out: 628 [Urine:483; Emesis/NG output:30; Drains:115] Intake/Output this shift: Total I/O In: 197.9 [I.V.:197.9] Out: 23 [Urine:23]  General appearance: ill-appearing Resp: on vent, PRVC, peak pressures 28-30 Cardio: regular, 70s. 1+ pitting edema BLE GI: soft, distended but slightly less tender. Extremities: no deformity, some edema Skin: Skin color, texture, turgor normal. No rashes or lesions Neurologic:  intubated/sedated Incision/Wound: incisions are all healing without sequelae. Right upper quadrant drain with bilious output, amt down 115cc GU: severe penoscrotal edema, foley in place  Lab Results:  Recent Labs    04/30/18 0417 05/01/18 0350  WBC 14.1* 11.9*  HGB 7.3* 6.8*  HCT 23.8* 22.1*  PLT 302 284   BMET Recent Labs    04/30/18 0000 04/30/18 0417 04/30/18 1932  NA 142  --  144  K 5.1  --  4.5  CL 119*  --  120*  CO2 20*  --  18*  GLUCOSE 132*  --  146*  BUN 54*  --  51*  CREATININE 2.99* 3.02* 2.89*  CALCIUM 7.1*  --  7.4*   PT/INR Recent Labs    04/29/18 0501 04/30/18 0417  LABPROT 19.8* 19.4*  INR 1.7* 1.7*   ABG No results for input(s): PHART, HCO3 in the last 72 hours.  Invalid input(s): PCO2, PO2  Studies/Results: Dg Ercp  Result Date: 04/30/2018 CLINICAL DATA:  Biliary stent EXAM:  ERCP TECHNIQUE: Multiple spot images obtained with the fluoroscopic device and submitted for interpretation post-procedure. FLUOROSCOPY TIME:  Fluoroscopy Time:  34 seconds Radiation Exposure Index (if provided by the fluoroscopic device): Number of Acquired Spot Images: 0 COMPARISON:  None. FINDINGS: Two spot images demonstrate a biliary stent in the common bile duct. IMPRESSION: See above. These images were submitted for radiologic interpretation only. Please see the procedural report for the amount of contrast and the fluoroscopy time utilized. Electronically Signed   By: Jolaine Click M.D.   On: 04/30/2018 12:56    Anti-infectives: Anti-infectives (From admission, onward)   Start     Dose/Rate Route Frequency Ordered Stop   04/27/18 1800  vancomycin (VANCOCIN) IVPB 750 mg/150 ml premix  Status:  Discontinued     750 mg 150 mL/hr over 60 Minutes Intravenous Every 24 hours 04/26/18 1634 04/27/18 0730   04/27/18 1600  ceFEPIme (MAXIPIME) 2 g in sodium chloride 0.9 % 100 mL IVPB  Status:  Discontinued     2 g 200 mL/hr over 30 Minutes Intravenous Every 24 hours 04/26/18 1629 04/27/18 1412   04/27/18 1600  ceFEPIme (MAXIPIME) 1 g in sodium chloride 0.9 % 100 mL IVPB     1 g 200 mL/hr over 30 Minutes Intravenous Every 24 hours 04/27/18 1412     04/26/18 2359  metroNIDAZOLE (FLAGYL) IVPB 500 mg     500 mg 100 mL/hr over 60 Minutes Intravenous  Every 8 hours 04/26/18 2325     04/26/18 1700  vancomycin (VANCOCIN) 1,500 mg in sodium chloride 0.9 % 500 mL IVPB     1,500 mg 250 mL/hr over 120 Minutes Intravenous  Once 04/26/18 1557 04/26/18 2059   04/26/18 1600  ceFEPIme (MAXIPIME) 2 g in sodium chloride 0.9 % 100 mL IVPB     2 g 200 mL/hr over 30 Minutes Intravenous  Once 04/26/18 1553 04/26/18 1650   04/26/18 1600  metroNIDAZOLE (FLAGYL) IVPB 500 mg     500 mg 100 mL/hr over 60 Minutes Intravenous  Once 04/26/18 1553 04/26/18 1830   04/26/18 1600  vancomycin (VANCOCIN) IVPB 1000 mg/200 mL premix   Status:  Discontinued     1,000 mg 200 mL/hr over 60 Minutes Intravenous  Once 04/26/18 1553 04/26/18 1557      Assessment/Plan: s/p Procedure(s): ENDOSCOPIC RETROGRADE CHOLANGIOPANCREATOGRAPHY (ERCP) (N/A) BILIARY STENT PLACEMENT Status post laparoscopic cholecystectomy 04/16/2018 by Dr. Corliss Skains Gallbladder fossa abscess- IV ABX,drain inserted with bilious output.  ERCP/stent 3/20; LFTs so appears stent not occluded. Cont drain 115cc/day; growing serratia  PNA, Acute kidney injury- per primary. Respiratory failure likely secondary to pneumonia. Worsening on last CXR Coagulopathy: likely more reflective of liver dysfunction then medication effect as he was on Eliquis, inr stable 1/.7 Anemia - getting prbc now FEN- cont mIVF; if abd xray ok without significant bowel distension, can start trophic TF  Mary Sella. Andrey Campanile, MD, FACS General, Bariatric, & Minimally Invasive Surgery [facility], [place]   LOS: 5 days    Gaynelle Adu 05/01/2018

## 2018-05-01 NOTE — Progress Notes (Signed)
NAME:  Vincent Barrett, MRN:  [id number], DOB:  [birth year], LOS: 5 ADMISSION DATE:  04/26/2018, CONSULTATION DATE:  04/27/2018 REFERRING MD: Hanley Ben, CHIEF COMPLAINT:  Respiratory Distress, Suspected sepsis   Brief History    85 year old male with history of paroxysmal atrial fibrillation on Eliquis, hypertension, COPD, CKD stage III, BPH, recent acute calculus cholecystitis status post laparoscopic cholecystectomy on 04/16/2018 followed by ERCP with biliary sphincterotomy and balloon extraction of the CBD stone on 04/22/2018 adm with abdominal pain and shortness of breath on 04/26/2018.  He was found to be febrile and tachycardic with significant leukocytosis.  CT abdomen and pelvis showed possible gallbladder fossa abscess versus biloma.  Decompensated due to biliary peritonitis and subsequently underwent IR drain followed by ERCP and stenting on 3/20  Past Medical History   Past Medical History:  Diagnosis Date  . BPH (benign prostatic hyperplasia)   . COPD (chronic obstructive pulmonary disease) (HCC)   . Hypertension   . OA (osteoarthritis)   . PAF (paroxysmal atrial fibrillation) (HCC)     Significant Hospital Events   3/12 - endoscopy, ERCP 3/16 Admission -  3/17 Transfer to ICU 3/17 Intubation and cvl - Intubated and Central Line placed for FFP and Levophed infusions.  ABG pending. CXR for CVC placement pending 3/18 - pall care holding off due to wife wantnig full Rx.40% fio2, fent gtt, levophed . Gets agitated easily preventing SBT 04/28/17 - GI planning ERCP. n vent. RN says HOH + and his hearing aids not here.  3/20  - ercp with cbd stent for bile leak but there was oozing of blood and FFP/Vit K given by GI. Dr Marca Ancona concerned that stent might bget blocked,. Renal failure worse - creat 3    Consults:  3/17>> IR 3/17>> Surgery  GI  Procedures:  3/17>> Intubation 3/17 L IJ CVC 3/12 ERCP 3/12 Upper Endoscopy  Significant Diagnostic Tests:  3/16>>CT Abdomen/ Pelvis   Interval cholecystectomy. 7.6 x 10 x 17 cm fluid collection emanating from the gallbladder fossa and tracking posteriorly with a  few bubbles of air within the collection which may reflect a biloma  versus abscess.  Micro Data:  3/16 Blood >>ng 3/16 Urine >>ng 3/17 peritoneal fluid >> serratia 3/17 Urine Strep >neg 3/17 Urine Legionella neg  Antimicrobials:  Cefepime 3/17>> Flagyl 3/17>> Vanc 3/16 >> stopped   Interim history/subjective:  Remains critically ill, intubated Levophed being tapered to off. Urine output remains poor Severe anasarca   Objective   Blood pressure 111/62, pulse 79, temperature 98 F (36.7 C), temperature source Oral, resp. rate (!) 22, height 5\' 6"  (1.676 m), weight 91.8 kg, SpO2 99 %.    Vent Mode: PRVC FiO2 (%):  [30 %] 30 % Set Rate:  [2 bmp-22 bmp] 2 bmp Vt Set:  [510 mL-610 mL] 510 mL PEEP:  [5 cmH20] 5 cmH20 Plateau Pressure:  [23 cmH20-28 cmH20] 26 cmH20   Intake/Output Summary (Last 24 hours) at 05/01/2018 1031 Last data filed at 05/01/2018 1000 Gross per 24 hour  Intake 4391.37 ml  Output 558 ml  Net 3833.37 ml   Filed Weights   04/28/18 0415 04/29/18 0245 05/01/18 0200  Weight: 80.2 kg 84.4 kg 91.8 kg   Elderly man, orally intubated, no distress, acutely ill Sedated on fentanyl drip, RA SS 0, eyes open and follows commands Mild pallor, no icterus, no JVD Decreased breath sounds bilateral, Soft mildly distended abdomen, hypoactive bowel sounds, decreased bilious drain in pouch S1-S2 irregular Anasarca, massive edema of scrotum  Chest x-ray personally reviewed which shows left-sided infiltrate and effusion   Resolved Hospital Problem list     Assessment & Plan:  Acute Respiratory Failure in setting of suspected LLL pneumonia , small pleural effusions, and suspected peritonitis History of COPD Intubated 3/17>> Suspected aspiration  Plan: Start spontaneous breathing trials but would need extensive diuresis before  attempting extubation VAP prevention   Circulatory shock  A: resolving  P: Off Levophed   Acute Kidney Injury -Baseline creatinine 1.5  Plan  -Continue IV fluids Start with Lasix 801 and titrate depending on response needs extensive diuresis   Biliary peritonitis due to gall bladder fossa abscess -> s/p ERCP with stent decrease in biliary drainage Serratia Plan: Continue cefepime NPO X-ray abdomen and if no ileus then can start trickle tube feeds TF/ TNA if unable to start tube feeds GI and CCS following   Elevated INR  - was on  Eliquis ./ Vitamin K,  5 mg given , FFP 4 units   Plan:  Last INR 1.7 and vitamin K given Goal hemoglobin 7   Endocrine Plan: CBG's Q 4 SSI   Goals of care 3/20 - No CPR, NO LTAC, NO TRACH, NO SNF,. NO CRRT -> full medical care otherwise.   Best practice:  Diet: NPO Pain/Anxiety/Delirium protocol (if indicated): Fentanyl gtt/ Versed prn VAP protocol (if indicated): Ordered DVT prophylaxis: SCDs, resume AC at some point GI prophylaxis: Protonix Glucose control: CBG/ SSI Mobility: BR Code Status: Full Family Communication: . Wife Alona Bene   Disposition: ICU      ATTESTATION & SIGNATURE   The patient is critically ill with multiple organ systems failure and requires high complexity decision making for assessment and support, frequent evaluation and titration of therapies, application of advanced monitoring technologies and extensive interpretation of multiple databases. Critical Care Time devoted to patient care services described in this note independent of APP/resident  time is 35 minutes.   Cyril Mourning MD. Tonny Bollman. [facility] Pager [phone] If no response call [phone]     05/01/2018 10:31 AM

## 2018-05-01 NOTE — Progress Notes (Signed)
Subjective: The patient was seen and examined at bedside. Remains intubated. Has not had a bowel movement since this procedure. Drainage from JP significantly decreased. Off IV pressors, slight drop in hemoglobin from 7.3-6.8, receiving 1 unit PRBC.  Objective: Vital signs in last 24 hours: Temp:  [97.7 F (36.5 C)-98.8 F (37.1 C)] 98 F (36.7 C) (03/21 0837) Pulse Rate:  [55-100] 77 (03/21 0945) Resp:  [19-23] 22 (03/21 0945) BP: (85-121)/(48-79) 102/66 (03/21 0945) SpO2:  [92 %-100 %] 99 % (03/21 0945) FiO2 (%):  [30 %] 30 % (03/21 0837) Weight:  [91.8 kg] 91.8 kg (03/21 0200) Weight change:  Last BM Date: 04/27/18  PE: Intubated, not sedated GENERAL: Generalized edema, edema noted in abdominal wall, upper and lower extremities, scrotal swelling ABDOMEN: Distended, nontender, JP drain with minimal bilious output EXTREMITIES: Bilateral pitting pedal edema  Lab Results: Results for orders placed or performed during the hospital encounter of 04/26/18 (from the past 48 hour(s))  Glucose, capillary     Status: None   Collection Time: 04/29/18 11:43 AM  Result Value Ref [place]   Glucose-Capillary 90 70 - 99 mg/dL  Glucose, capillary     Status: Abnormal   Collection Time: 04/29/18  4:32 PM  Result Value Ref [place]   Glucose-Capillary 101 (H) 70 - 99 mg/dL  Glucose, capillary     Status: Abnormal   Collection Time: 04/29/18  8:14 PM  Result Value Ref [place]   Glucose-Capillary 112 (H) 70 - 99 mg/dL  Surgical pcr screen     Status: None   Collection Time: 04/29/18  9:11 PM  Result Value Ref [place]   MRSA, PCR NEGATIVE NEGATIVE   Staphylococcus aureus NEGATIVE NEGATIVE    Comment: (NOTE) The Xpert SA Assay (FDA approved for NASAL specimens in patients 15 years of age and older), is one component of a comprehensive surveillance program. It is not intended to diagnose infection nor to guide or monitor treatment. Performed at [facility], [address] [place],  [place] [address]   Basic metabolic panel     Status: Abnormal   Collection Time: 04/30/18 12:00 AM  Result Value Ref [place]   Sodium 142 135 - 145 mmol/L   Potassium 5.1 3.5 - 5.1 mmol/L   Chloride 119 (H) 98 - 111 mmol/L   CO2 20 (L) 22 - 32 mmol/L   Glucose, Bld 132 (H) 70 - 99 mg/dL   BUN 54 (H) 8 - 23 mg/dL   Creatinine, Ser 9.81 (H) 0.61 - 1.24 mg/dL   Calcium 7.1 (L) 8.9 - 10.3 mg/dL   GFR calc non Af Amer 18 (L) >60 mL/min   GFR calc Af Amer 20 (L) >60 mL/min   Anion gap 3 (L) 5 - 15    Comment: Performed at [facility], [address] [place], [place] [address]  Lactic acid, plasma     Status: None   Collection Time: 04/30/18 12:00 AM  Result Value Ref [place]   Lactic Acid, Venous 1.3 0.5 - 1.9 mmol/L    Comment: Performed at [facility], [address] [place], [place] [address]  Glucose, capillary     Status: Abnormal   Collection Time: 04/30/18 12:09 AM  Result Value Ref [place]   Glucose-Capillary 122 (H) 70 - 99 mg/dL  Creatinine, serum     Status: Abnormal   Collection Time: 04/30/18  4:17 AM  Result Value Ref [place]   Creatinine, Ser 3.02 (H) 0.61 - 1.24  mg/dL   GFR calc non Af Amer 17 (L) >60 mL/min   GFR calc Af Amer 20 (L) >60 mL/min    Comment: Performed at [facility], [address] [place], [place] [address]  CBC with Differential/Platelet     Status: Abnormal   Collection Time: 04/30/18  4:17 AM  Result Value Ref [place]   WBC 14.1 (H) 4.0 - 10.5 K/uL   RBC 2.43 (L) 4.22 - 5.81 MIL/uL   Hemoglobin 7.3 (L) 13.0 - 17.0 g/dL   HCT 84.1 (L) 32.4 - 40.1 %   MCV 97.9 80.0 - 100.0 fL   MCH 30.0 26.0 - 34.0 pg   MCHC 30.7 30.0 - 36.0 g/dL   RDW 02.7 25.3 - 66.4 %   Platelets 302 150 - 400 K/uL   nRBC 0.0 0.0 - 0.2 %   Neutrophils Relative % 84 %   Neutro Abs 11.7 (H) 1.7 - 7.7 K/uL   Lymphocytes Relative 7 %   Lymphs Abs 1.0 0.7 - 4.0 K/uL   Monocytes Relative 8 %   Monocytes Absolute 1.1 (H) 0.1 - 1.0 K/uL   Eosinophils Relative 1 %    Eosinophils Absolute 0.2 0.0 - 0.5 K/uL   Basophils Relative 0 %   Basophils Absolute 0.0 0.0 - 0.1 K/uL   Immature Granulocytes 0 %   Abs Immature Granulocytes 0.06 0.00 - 0.07 K/uL    Comment: Performed at [facility], [address], [place], [place] [address]  Magnesium     Status: None   Collection Time: 04/30/18  4:17 AM  Result Value Ref [place]   Magnesium 2.1 1.7 - 2.4 mg/dL    Comment: Performed at [facility], [address], [place], [place] [address]  Phosphorus     Status: Abnormal   Collection Time: 04/30/18  4:17 AM  Result Value Ref [place]   Phosphorus 2.3 (L) 2.5 - 4.6 mg/dL    Comment: Performed at [facility], [address], [place], [place] [address]  Protime-INR     Status: Abnormal   Collection Time: 04/30/18  4:17 AM  Result Value Ref [place]   Prothrombin Time 19.4 (H) 11.4 - 15.2 seconds   INR 1.7 (H) 0.8 - 1.2    Comment: (NOTE) INR goal varies based on device and disease states. Performed at [facility], [address] [place], [place] [address]   Glucose, capillary     Status: Abnormal   Collection Time: 04/30/18  4:22 AM  Result Value Ref [place]   Glucose-Capillary 119 (H) 70 - 99 mg/dL  Glucose, capillary     Status: Abnormal   Collection Time: 04/30/18  7:52 AM  Result Value Ref [place]   Glucose-Capillary 130 (H) 70 - 99 mg/dL  Glucose, capillary     Status: Abnormal   Collection Time: 04/30/18 11:32 AM  Result Value Ref [place]   Glucose-Capillary 121 (H) 70 - 99 mg/dL  Prepare fresh frozen plasma     Status: None (Preliminary result)   Collection Time: 04/30/18 12:37 PM  Result Value Ref [place]   Unit Number [id number]    Blood Component Type THAWED PLASMA    Unit division 00    Status of Unit ISSUED    Transfusion Status      OK TO TRANSFUSE Performed at [facility], [address], [place], [place] [address]   Prepare fresh frozen plasma     Status: None (Preliminary  result)   Collection Time: 04/30/18   1:21 PM  Result Value Ref [place]   Unit Number [id number]    Blood Component Type THAWED PLASMA    Unit division 00    Status of Unit ALLOCATED    Transfusion Status OK TO TRANSFUSE   Glucose, capillary     Status: Abnormal   Collection Time: 04/30/18  3:30 PM  Result Value Ref [place]   Glucose-Capillary 121 (H) 70 - 99 mg/dL  Basic metabolic panel     Status: Abnormal   Collection Time: 04/30/18  7:32 PM  Result Value Ref [place]   Sodium 144 135 - 145 mmol/L   Potassium 4.5 3.5 - 5.1 mmol/L   Chloride 120 (H) 98 - 111 mmol/L   CO2 18 (L) 22 - 32 mmol/L   Glucose, Bld 146 (H) 70 - 99 mg/dL   BUN 51 (H) 8 - 23 mg/dL   Creatinine, Ser 1.19 (H) 0.61 - 1.24 mg/dL   Calcium 7.4 (L) 8.9 - 10.3 mg/dL   GFR calc non Af Amer 18 (L) >60 mL/min   GFR calc Af Amer 21 (L) >60 mL/min   Anion gap 6 5 - 15    Comment: Performed at [facility], [address] [place], [place] [address]  Magnesium     Status: None   Collection Time: 04/30/18  7:32 PM  Result Value Ref [place]   Magnesium 2.0 1.7 - 2.4 mg/dL    Comment: Performed at [facility], [address], [place], [place] [address]  Glucose, capillary     Status: Abnormal   Collection Time: 04/30/18  7:46 PM  Result Value Ref [place]   Glucose-Capillary 119 (H) 70 - 99 mg/dL  Glucose, capillary     Status: Abnormal   Collection Time: 05/01/18 12:42 AM  Result Value Ref [place]   Glucose-Capillary 136 (H) 70 - 99 mg/dL  CBC with Differential/Platelet     Status: Abnormal   Collection Time: 05/01/18  3:50 AM  Result Value Ref [place]   WBC 11.9 (H) 4.0 - 10.5 K/uL   RBC 2.24 (L) 4.22 - 5.81 MIL/uL   Hemoglobin 6.8 (LL) 13.0 - 17.0 g/dL    Comment: REPEATED TO VERIFY THIS CRITICAL RESULT HAS VERIFIED AND BEEN CALLED TO M HOPPER RN BY TIFFANY SHORT ON 03 21 2020 AT 0454, AND HAS BEEN READ BACK.     HCT 22.1 (L) 39.0 - 52.0 %   MCV 98.7 80.0 - 100.0 fL   MCH 30.4 26.0 - 34.0 pg   MCHC 30.8 30.0 - 36.0 g/dL   RDW 30.8 65.7 -  84.6 %   Platelets 284 150 - 400 K/uL   nRBC 0.0 0.0 - 0.2 %   Neutrophils Relative % 77 %   Neutro Abs 9.2 (H) 1.7 - 7.7 K/uL   Lymphocytes Relative 10 %   Lymphs Abs 1.1 0.7 - 4.0 K/uL   Monocytes Relative 9 %   Monocytes Absolute 1.1 (H) 0.1 - 1.0 K/uL   Eosinophils Relative 3 %   Eosinophils Absolute 0.3 0.0 - 0.5 K/uL   Basophils Relative 0 %   Basophils Absolute 0.0 0.0 - 0.1 K/uL   Immature Granulocytes 1 %   Abs Immature Granulocytes 0.10 (H) 0.00 - 0.07 K/uL    Comment: Performed at [facility], [address] [place], [place] [address]  Magnesium     Status: None   Collection Time: 05/01/18  3:50 AM  Result Value Ref [place]   Magnesium 2.1 1.7 - 2.4 mg/dL    Comment: Performed at [facility], [address] [place], [place] [address]  Phosphorus     Status: None   Collection Time: 05/01/18  3:50 AM  Result Value Ref [place]   Phosphorus 2.6 2.5 - 4.6 mg/dL    Comment: Performed at [facility], [address], [place], [place] [address]  Hepatic function panel     Status: Abnormal   Collection Time: 05/01/18  3:50 AM  Result Value Ref [place]   Total Protein 5.0 (L) 6.5 - 8.1 g/dL   Albumin 1.8 (L) 3.5 - 5.0 g/dL   AST 36 15 - 41 U/L   ALT 28 0 - 44 U/L   Alkaline Phosphatase 72 38 - 126 U/L   Total Bilirubin 0.8 0.3 - 1.2 mg/dL   Bilirubin, Direct 0.3 (H) 0.0 - 0.2 mg/dL   Indirect Bilirubin 0.5 0.3 - 0.9 mg/dL    Comment: Performed at [facility], [address] [place], [place] [address]  Glucose, capillary     Status: Abnormal   Collection Time: 05/01/18  3:55 AM  Result Value Ref [place]   Glucose-Capillary 126 (H) 70 - 99 mg/dL  Type and screen [facility]     Status: None (Preliminary result)   Collection Time: 05/01/18  5:42 AM  Result Value Ref [place]   ABO/RH(D) A POS    Antibody Screen NEG    Sample Expiration 05/04/2018    Unit Number [id number]    Blood Component Type RED CELLS,LR    Unit division 00     Status of Unit ISSUED    Transfusion Status OK TO TRANSFUSE    Crossmatch Result      Compatible Performed at [facility], [address], [place], [place] [address]   Glucose, capillary     Status: Abnormal   Collection Time: 05/01/18  7:49 AM  Result Value Ref [place]   Glucose-Capillary 115 (H) 70 - 99 mg/dL    Studies/Results: Dg Ercp  Result Date: 04/30/2018 CLINICAL DATA:  Biliary stent EXAM: ERCP TECHNIQUE: Multiple spot images obtained with the fluoroscopic device and submitted for interpretation post-procedure. FLUOROSCOPY TIME:  Fluoroscopy Time:  34 seconds Radiation Exposure Index (if provided by the fluoroscopic device): Number of Acquired Spot Images: 0 COMPARISON:  None. FINDINGS: Two spot images demonstrate a biliary stent in the common bile duct. IMPRESSION: See above. These images were submitted for radiologic interpretation only. Please see the procedural report for the amount of contrast and the fluoroscopy time utilized. Electronically Signed   By: Jolaine Click M.D.   On: 04/30/2018 12:56    Medications: I have reviewed the patient's current medications.  Assessment: Bile leak, status post ERCP and 10 French x9 cm plastic CBD stent placed, normal liver enzymes Improvement in leukocytosis, off IV pressors Drop in hemoglobin, oozing noted during ERCP with blood clots noted through ampulla, and received vitamin K 10 mg IV and 1 unit FFP, no evidence of melena or hematochezia  Respiratory failure, remains intubated Renal impairment, mild acidosis  A. Fib, COPD, BPH  Plan: Abdominal x-ray, if there is no evidence of ileus, okay to start OG tube feeding. Significant decrease in JP drainage( 575 ml to 260 ml to 160 ml and 115 ml in the last 24 hours) with less than 50 ml in the last shift    Kerin Salen 05/01/2018, 10:07  AM   Pager [phone] If no answer or after 5 PM call [phone]

## 2018-05-02 ENCOUNTER — Inpatient Hospital Stay (HOSPITAL_COMMUNITY): Payer: Medicare Other

## 2018-05-02 LAB — TYPE AND SCREEN
ABO/RH(D): A POS
Antibody Screen: NEGATIVE
Unit division: 0

## 2018-05-02 LAB — CBC WITH DIFFERENTIAL/PLATELET
ABS IMMATURE GRANULOCYTES: 0.17 10*3/uL — AB (ref 0.00–0.07)
BASOS PCT: 0 %
Basophils Absolute: 0 10*3/uL (ref 0.0–0.1)
Eosinophils Absolute: 0.3 10*3/uL (ref 0.0–0.5)
Eosinophils Relative: 3 %
HCT: 27.1 % — ABNORMAL LOW (ref 39.0–52.0)
Hemoglobin: 8.9 g/dL — ABNORMAL LOW (ref 13.0–17.0)
Immature Granulocytes: 2 %
Lymphocytes Relative: 12 %
Lymphs Abs: 1.2 10*3/uL (ref 0.7–4.0)
MCH: 31.2 pg (ref 26.0–34.0)
MCHC: 32.8 g/dL (ref 30.0–36.0)
MCV: 95.1 fL (ref 80.0–100.0)
Monocytes Absolute: 1.2 10*3/uL — ABNORMAL HIGH (ref 0.1–1.0)
Monocytes Relative: 12 %
NRBC: 0 % (ref 0.0–0.2)
Neutro Abs: 7.5 10*3/uL (ref 1.7–7.7)
Neutrophils Relative %: 71 %
Platelets: 304 10*3/uL (ref 150–400)
RBC: 2.85 MIL/uL — ABNORMAL LOW (ref 4.22–5.81)
RDW: 14.5 % (ref 11.5–15.5)
WBC: 10.4 10*3/uL (ref 4.0–10.5)

## 2018-05-02 LAB — BASIC METABOLIC PANEL
Anion gap: 6 (ref 5–15)
Anion gap: 4 — ABNORMAL LOW (ref 5–15)
Anion gap: 6 (ref 5–15)
BUN: 49 mg/dL — ABNORMAL HIGH (ref 8–23)
BUN: 48 mg/dL — ABNORMAL HIGH (ref 8–23)
BUN: 48 mg/dL — ABNORMAL HIGH (ref 8–23)
CALCIUM: 7 mg/dL — AB (ref 8.9–10.3)
CO2: 21 mmol/L — ABNORMAL LOW (ref 22–32)
CO2: 21 mmol/L — ABNORMAL LOW (ref 22–32)
CO2: 20 mmol/L — ABNORMAL LOW (ref 22–32)
Calcium: 7.5 mg/dL — ABNORMAL LOW (ref 8.9–10.3)
Calcium: 7.3 mg/dL — ABNORMAL LOW (ref 8.9–10.3)
Chloride: 116 mmol/L — ABNORMAL HIGH (ref 98–111)
Chloride: 113 mmol/L — ABNORMAL HIGH (ref 98–111)
Chloride: 108 mmol/L (ref 98–111)
Creatinine, Ser: 2.89 mg/dL — ABNORMAL HIGH (ref 0.61–1.24)
Creatinine, Ser: 3.17 mg/dL — ABNORMAL HIGH (ref 0.61–1.24)
Creatinine, Ser: 3.28 mg/dL — ABNORMAL HIGH (ref 0.61–1.24)
GFR calc Af Amer: 21 mL/min — ABNORMAL LOW (ref >60)
GFR calc Af Amer: 19 mL/min — ABNORMAL LOW (ref >60)
GFR calc non Af Amer: 16 mL/min — ABNORMAL LOW (ref >60)
GFR, EST AFRICAN AMERICAN: 18 mL/min — AB (ref >60)
GFR, EST NON AFRICAN AMERICAN: 18 mL/min — AB (ref >60)
GFR, EST NON AFRICAN AMERICAN: 16 mL/min — AB (ref >60)
GLUCOSE: 115 mg/dL — AB (ref 70–99)
Glucose, Bld: 116 mg/dL — ABNORMAL HIGH (ref 70–99)
Glucose, Bld: 116 mg/dL — ABNORMAL HIGH (ref 70–99)
Potassium: 3.8 mmol/L (ref 3.5–5.1)
Potassium: >7.5 mmol/L (ref 3.5–5.1)
Potassium: >7.5 mmol/L (ref 3.5–5.1)
Sodium: 143 mmol/L (ref 135–145)
Sodium: 138 mmol/L (ref 135–145)
Sodium: 134 mmol/L — ABNORMAL LOW (ref 135–145)

## 2018-05-02 LAB — BPAM RBC
Blood Product Expiration Date: 202004112359
ISSUE DATE / TIME: 202003210817
Unit Type and Rh: 6200

## 2018-05-02 LAB — AEROBIC/ANAEROBIC CULTURE W GRAM STAIN (SURGICAL/DEEP WOUND)

## 2018-05-02 LAB — AEROBIC/ANAEROBIC CULTURE (SURGICAL/DEEP WOUND)

## 2018-05-02 LAB — GLUCOSE, CAPILLARY
GLUCOSE-CAPILLARY: 107 mg/dL — AB (ref 70–99)
Glucose-Capillary: 112 mg/dL — ABNORMAL HIGH (ref 70–99)
Glucose-Capillary: 114 mg/dL — ABNORMAL HIGH (ref 70–99)
Glucose-Capillary: 106 mg/dL — ABNORMAL HIGH (ref 70–99)
Glucose-Capillary: 110 mg/dL — ABNORMAL HIGH (ref 70–99)

## 2018-05-02 LAB — PHOSPHORUS: PHOSPHORUS: 3 mg/dL (ref 2.5–4.6)

## 2018-05-02 LAB — MAGNESIUM: Magnesium: 1.8 mg/dL (ref 1.7–2.4)

## 2018-05-02 MED ORDER — FUROSEMIDE 10 MG/ML IJ SOLN
5.0000 mg/h | INTRAVENOUS | Status: DC
  Administered 2018-05-02: 8 mg/h via INTRAVENOUS
  Filled 2018-05-02: qty 11

## 2018-05-02 MED ORDER — CHLORHEXIDINE GLUCONATE 0.12 % MT SOLN
15.0000 mL | Freq: Two times a day (BID) | OROMUCOSAL | Status: DC
  Administered 2018-05-02 – 2018-05-06 (×7): 15 mL via OROMUCOSAL
  Filled 2018-05-02 (×5): qty 15

## 2018-05-02 MED ORDER — SODIUM POLYSTYRENE SULFONATE 15 GM/60ML PO SUSP
30.0000 g | Freq: Once | ORAL | Status: AC
  Administered 2018-05-02: 30 g via RECTAL
  Filled 2018-05-02: qty 120

## 2018-05-02 MED ORDER — SODIUM POLYSTYRENE SULFONATE 15 GM/60ML PO SUSP
30.0000 g | Freq: Once | ORAL | Status: AC
  Administered 2018-05-02: 15 g via ORAL
  Filled 2018-05-02 (×2): qty 120

## 2018-05-02 MED ORDER — SODIUM POLYSTYRENE SULFONATE 15 GM/60ML PO SUSP: 30.0000 g | Freq: Once | ORAL | Status: DC

## 2018-05-02 MED ORDER — ORAL CARE MOUTH RINSE
15.0000 mL | Freq: Two times a day (BID) | OROMUCOSAL | Status: DC
  Administered 2018-05-03 – 2018-05-05 (×5): 15 mL via OROMUCOSAL

## 2018-05-02 MED ORDER — SODIUM CHLORIDE 0.9 % IV SOLN
2.0000 g | INTRAVENOUS | Status: DC
  Administered 2018-05-02 – 2018-05-06 (×5): 2 g via INTRAVENOUS
  Filled 2018-05-02 (×5): qty 20

## 2018-05-02 NOTE — Progress Notes (Signed)
eLink Physician-Brief Progress Note Patient Name: Vincent Barrett DOB: [birth year] MRN: [id number]   Date of Service  05/02/2018  HPI/Events of Note  Kayexalate has leaked out around rectal tube.   eICU Interventions  Will order: 1. Place NGT to LIS.  2. Kayexalate 30 gm per tube when NGT place and placement confirmed.      Intervention Category Major Interventions: Electrolyte abnormality - evaluation and management  Brizeida Mcmurry Eugene 05/02/2018, 10:10 PM

## 2018-05-02 NOTE — Progress Notes (Signed)
Subjective: Remains intubated. Started on OG tube feeding. Off IV pressors, on IV fentanyl drip for sedation. Last bowel movement was on 04/27/2018.  Objective: Vital signs in last 24 hours: Temp:  [96.4 F (35.8 C)-98.7 F (37.1 C)] 96.4 F (35.8 C) (03/22 0753) Pulse Rate:  [62-100] 76 (03/22 0900) Resp:  [18-22] 22 (03/22 0900) BP: (90-122)/(45-77) 104/60 (03/22 0900) SpO2:  [94 %-100 %] 100 % (03/22 0800) FiO2 (%):  [30 %] 30 % (03/22 0800) Weight:  [91.9 kg] 91.9 kg (03/22 0500) Weight change: 0.1 kg Last BM Date: 04/27/18  PE: Has eyes open, moves head, does not follow commands GENERAL: Intubated ABDOMEN: JP drain with less than 50 cc of bilious output, normoactive bowel sounds EXTREMITIES: Edematous extremities, large scrotal edema  Lab Results: Results for orders placed or performed during the hospital encounter of 04/26/18 (from the past 48 hour(s))  Glucose, capillary     Status: Abnormal   Collection Time: 04/30/18 11:32 AM  Result Value Ref Range   Glucose-Capillary 121 (H) 70 - 99 mg/dL  Prepare fresh frozen plasma     Status: None   Collection Time: 04/30/18 12:37 PM  Result Value Ref Range   Unit Number [id number]    Blood Component Type THAWED PLASMA    Unit division 00    Status of Unit ISSUED,FINAL    Transfusion Status      OK TO TRANSFUSE Performed at [facility], [address], [place], [place] [address]   Prepare fresh frozen plasma     Status: None (Preliminary result)   Collection Time: 04/30/18  1:21 PM  Result Value Ref Range   Unit Number [id number]    Blood Component Type THAWED PLASMA    Unit division 00    Status of Unit ALLOCATED    Transfusion Status OK TO TRANSFUSE   Glucose, capillary     Status: Abnormal   Collection Time: 04/30/18  3:30 PM  Result Value Ref Range   Glucose-Capillary 121 (H) 70 - 99 mg/dL  Basic metabolic panel     Status: Abnormal   Collection Time: 04/30/18  7:32 PM  Result Value Ref Range    Sodium 144 135 - 145 mmol/L   Potassium 4.5 3.5 - 5.1 mmol/L   Chloride 120 (H) 98 - 111 mmol/L   CO2 18 (L) 22 - 32 mmol/L   Glucose, Bld 146 (H) 70 - 99 mg/dL   BUN 51 (H) 8 - 23 mg/dL   Creatinine, Ser 9.67 (H) 0.61 - 1.24 mg/dL   Calcium 7.4 (L) 8.9 - 10.3 mg/dL   GFR calc non Af Amer 18 (L) >60 mL/min   GFR calc Af Amer 21 (L) >60 mL/min   Anion gap 6 5 - 15    Comment: Performed at [facility], [address] [place], [place] [address]  Magnesium     Status: None   Collection Time: 04/30/18  7:32 PM  Result Value Ref Range   Magnesium 2.0 1.7 - 2.4 mg/dL    Comment: Performed at [facility], [address] [place], [place] [address]  Glucose, capillary     Status: Abnormal   Collection Time: 04/30/18  7:46 PM  Result Value Ref Range   Glucose-Capillary 119 (H) 70 - 99 mg/dL  Glucose, capillary     Status: Abnormal   Collection Time: 05/01/18 12:42 AM  Result Value Ref Range   Glucose-Capillary 136 (H) 70 - 99 mg/dL  CBC  with Differential/Platelet     Status: Abnormal   Collection Time: 05/01/18  3:50 AM  Result Value Ref Range   WBC 11.9 (H) 4.0 - 10.5 K/uL   RBC 2.24 (L) 4.22 - 5.81 MIL/uL   Hemoglobin 6.8 (LL) 13.0 - 17.0 g/dL    Comment: REPEATED TO VERIFY THIS CRITICAL RESULT HAS VERIFIED AND BEEN CALLED TO M HOPPER RN BY TIFFANY SHORT ON 03 21 2020 AT 0454, AND HAS BEEN READ BACK.     HCT 22.1 (L) 39.0 - 52.0 %   MCV 98.7 80.0 - 100.0 fL   MCH 30.4 26.0 - 34.0 pg   MCHC 30.8 30.0 - 36.0 g/dL   RDW 25.0 53.9 - 76.7 %   Platelets 284 150 - 400 K/uL   nRBC 0.0 0.0 - 0.2 %   Neutrophils Relative % 77 %   Neutro Abs 9.2 (H) 1.7 - 7.7 K/uL   Lymphocytes Relative 10 %   Lymphs Abs 1.1 0.7 - 4.0 K/uL   Monocytes Relative 9 %   Monocytes Absolute 1.1 (H) 0.1 - 1.0 K/uL   Eosinophils Relative 3 %   Eosinophils Absolute 0.3 0.0 - 0.5 K/uL   Basophils Relative 0 %   Basophils Absolute 0.0 0.0 - 0.1 K/uL   Immature Granulocytes 1 %   Abs Immature  Granulocytes 0.10 (H) 0.00 - 0.07 K/uL    Comment: Performed at [facility], [address], [place], [place] [address]  Magnesium     Status: None   Collection Time: 05/01/18  3:50 AM  Result Value Ref Range   Magnesium 2.1 1.7 - 2.4 mg/dL    Comment: Performed at [facility], [address], [place], [place] [address]  Phosphorus     Status: None   Collection Time: 05/01/18  3:50 AM  Result Value Ref Range   Phosphorus 2.6 2.5 - 4.6 mg/dL    Comment: Performed at [facility], [address] [place], [place] [address]  Hepatic function panel     Status: Abnormal   Collection Time: 05/01/18  3:50 AM  Result Value Ref Range   Total Protein 5.0 (L) 6.5 - 8.1 g/dL   Albumin 1.8 (L) 3.5 - 5.0 g/dL   AST 36 15 - 41 U/L   ALT 28 0 - 44 U/L   Alkaline Phosphatase 72 38 - 126 U/L   Total Bilirubin 0.8 0.3 - 1.2 mg/dL   Bilirubin, Direct 0.3 (H) 0.0 - 0.2 mg/dL   Indirect Bilirubin 0.5 0.3 - 0.9 mg/dL    Comment: Performed at [facility], [address] [place], [place] [address]  Glucose, capillary     Status: Abnormal   Collection Time: 05/01/18  3:55 AM  Result Value Ref Range   Glucose-Capillary 126 (H) 70 - 99 mg/dL  Type and screen [facility]     Status: None (Preliminary result)   Collection Time: 05/01/18  5:42 AM  Result Value Ref Range   ABO/RH(D) A POS    Antibody Screen NEG    Sample Expiration 05/04/2018    Unit Number [id number]    Blood Component Type RED CELLS,LR    Unit division 00    Status of Unit ISSUED    Transfusion Status OK TO TRANSFUSE    Crossmatch Result      Compatible Performed at [facility], [address], [place], [place] [address]   Glucose, capillary  Status: Abnormal   Collection Time: 05/01/18  7:49 AM  Result Value Ref Range   Glucose-Capillary 115 (H) 70 - 99 mg/dL  Glucose, capillary     Status: Abnormal   Collection Time: 05/01/18 12:34 PM  Result Value Ref Range    Glucose-Capillary 108 (H) 70 - 99 mg/dL  Hemoglobin and hematocrit, blood     Status: Abnormal   Collection Time: 05/01/18 12:54 PM  Result Value Ref Range   Hemoglobin 8.6 (L) 13.0 - 17.0 g/dL    Comment: REPEATED TO VERIFY POST TRANSFUSION SPECIMEN    HCT 26.9 (L) 39.0 - 52.0 %    Comment: Performed at [facility], [address], [place], [place] [address]  Glucose, capillary     Status: None   Collection Time: 05/01/18  4:17 PM  Result Value Ref Range   Glucose-Capillary 89 70 - 99 mg/dL  Glucose, capillary     Status: None   Collection Time: 05/01/18  7:47 PM  Result Value Ref Range   Glucose-Capillary 95 70 - 99 mg/dL  Glucose, capillary     Status: Abnormal   Collection Time: 05/01/18 11:25 PM  Result Value Ref Range   Glucose-Capillary 108 (H) 70 - 99 mg/dL  Glucose, capillary     Status: Abnormal   Collection Time: 05/02/18  3:33 AM  Result Value Ref Range   Glucose-Capillary 112 (H) 70 - 99 mg/dL  CBC with Differential/Platelet     Status: Abnormal   Collection Time: 05/02/18  3:47 AM  Result Value Ref Range   WBC 10.4 4.0 - 10.5 K/uL   RBC 2.85 (L) 4.22 - 5.81 MIL/uL   Hemoglobin 8.9 (L) 13.0 - 17.0 g/dL   HCT 60.4 (L) 54.0 - 98.1 %   MCV 95.1 80.0 - 100.0 fL   MCH 31.2 26.0 - 34.0 pg   MCHC 32.8 30.0 - 36.0 g/dL   RDW 19.1 47.8 - 29.5 %   Platelets 304 150 - 400 K/uL   nRBC 0.0 0.0 - 0.2 %   Neutrophils Relative % 71 %   Neutro Abs 7.5 1.7 - 7.7 K/uL   Lymphocytes Relative 12 %   Lymphs Abs 1.2 0.7 - 4.0 K/uL   Monocytes Relative 12 %   Monocytes Absolute 1.2 (H) 0.1 - 1.0 K/uL   Eosinophils Relative 3 %   Eosinophils Absolute 0.3 0.0 - 0.5 K/uL   Basophils Relative 0 %   Basophils Absolute 0.0 0.0 - 0.1 K/uL   Immature Granulocytes 2 %   Abs Immature Granulocytes 0.17 (H) 0.00 - 0.07 K/uL    Comment: Performed at [facility], [address] [place], [place] [address]  Magnesium     Status: None   Collection Time: 05/02/18  3:47 AM   Result Value Ref Range   Magnesium 1.8 1.7 - 2.4 mg/dL    Comment: Performed at [facility], [address], [place], [place] [address]  Phosphorus     Status: None   Collection Time: 05/02/18  3:47 AM  Result Value Ref Range   Phosphorus 3.0 2.5 - 4.6 mg/dL    Comment: Performed at [facility], [address] [place], [place] [address]  Basic metabolic panel     Status: Abnormal   Collection Time: 05/02/18  3:47 AM  Result Value Ref Range   Sodium 143 135 - 145 mmol/L   Potassium 3.8 3.5 - 5.1 mmol/L   Chloride 116 (H) 98 -  111 mmol/L   CO2 21 (L) 22 - 32 mmol/L   Glucose, Bld 116 (H) 70 - 99 mg/dL   BUN 49 (H) 8 - 23 mg/dL   Creatinine, Ser 1.612.89 (H) 0.61 - 1.24 mg/dL   Calcium 7.5 (L) 8.9 - 10.3 mg/dL   GFR calc non Af Amer 18 (L) >60 mL/min   GFR calc Af Amer 21 (L) >60 mL/min   Anion gap 6 5 - 15    Comment: Performed at [facility], [address] [place], [place] [address]  Glucose, capillary     Status: Abnormal   Collection Time: 05/02/18  8:05 AM  Result Value Ref Range   Glucose-Capillary 114 (H) 70 - 99 mg/dL    Studies/Results: Dg Abd 1 View  Result Date: 05/01/2018 CLINICAL DATA:  Status post OG tube placement. EXAM: ABDOMEN - 1 VIEW COMPARISON:  Single-view of the abdomen 05/01/2018. FINDINGS: OG tube is in place with the tip in the distal third portion of the duodenum. IMPRESSION: As above. Electronically Signed   By: Drusilla Kannerhomas  Dalessio M.D.   On: 05/01/2018 13:29   Dg Abd 1 View  Result Date: 05/01/2018 CLINICAL DATA:  Abdominal distension EXAM: ABDOMEN - 1 VIEW COMPARISON:  Plain film of the abdomen dated 04/27/2018. FINDINGS: Bowel gas pattern is nonobstructive. No evidence of soft tissue mass or abnormal fluid collections seen. No evidence of free intraperitoneal air. Pigtail catheter overlies the RIGHT upper quadrant. Surgical clips in the RIGHT upper quadrant, presumed cholecystectomy. No acute appearing osseous abnormality. IMPRESSION:  No acute findings. Nonobstructive bowel gas pattern. Pigtail catheter overlying the RIGHT upper quadrant. Electronically Signed   By: Bary RichardStan  Maynard M.D.   On: 05/01/2018 11:19   Dg Chest Port 1 View  Result Date: 05/02/2018 CLINICAL DATA:  85 year old male with history of acute respiratory failure. EXAM: PORTABLE CHEST 1 VIEW COMPARISON:  Chest x-ray 05/01/2018. FINDINGS: An endotracheal tube is in place with tip 4.2 cm above the carina. There is a left-sided internal jugular central venous catheter with tip terminating in the distal superior vena cava. A nasogastric tube is seen extending into the stomach, however, the tip of the nasogastric tube extends below the lower margin of the image. Opacities are noted throughout the left mid to lower lung, concerning for areas of airspace consolidation, as well as superimposed areas of atelectasis. Small left pleural effusion. Right lung appears relatively clear. Possible trace right pleural effusion. Diffuse peribronchial cuffing. No evidence of pulmonary edema. Heart size is normal. The patient is rotated to the left on today's exam, resulting in distortion of the mediastinal contours and reduced diagnostic sensitivity and specificity for mediastinal pathology. Aortic atherosclerosis. IMPRESSION: 1. Support apparatus, as above. 2. Slight improvement in lung volumes, but otherwise, the radiographic appearance the chest is essentially unchanged, as above. Electronically Signed   By: Trudie Reedaniel  Entrikin M.D.   On: 05/02/2018 05:11   Dg Chest Port 1 View  Result Date: 05/01/2018 CLINICAL DATA:  Acute respiratory failure EXAM: PORTABLE CHEST 1 VIEW COMPARISON:  Chest x-ray dated 04/29/2018. FINDINGS: Endotracheal tube appears adequately positioned with tip approximately 3 cm above the carina. LEFT IJ central line appears adequately positioned with tip at the level of the mid/lower SVC. Enteric tube is been retracted to the level of the midthoracic esophagus. Heart size  and mediastinal contours are stable. Dense opacity at the LEFT lung base, atelectasis versus pneumonia. Probable small LEFT pleural effusion. LEFT perihilar platelike opacity, consistent with atelectasis and/or edema.  RIGHT lung remains relatively clear. IMPRESSION: 1. Enteric/NG tube has been retracted to the level of the midthoracic esophagus. Recommend advancing to the stomach. 2. Endotracheal tube adequately positioned with tip approximately 3 cm above the carina. 3. Dense opacity at the LEFT lung base, atelectasis versus pneumonia. Probable small LEFT pleural effusion. These results will be called to the ordering clinician or representative by the Radiologist Assistant, and communication documented in the PACS or zVision Dashboard. Electronically Signed   By: Bary Richard M.D.   On: 05/01/2018 11:23   Dg Ercp  Result Date: 04/30/2018 CLINICAL DATA:  Biliary stent EXAM: ERCP TECHNIQUE: Multiple spot images obtained with the fluoroscopic device and submitted for interpretation post-procedure. FLUOROSCOPY TIME:  Fluoroscopy Time:  34 seconds Radiation Exposure Index (if provided by the fluoroscopic device): Number of Acquired Spot Images: 0 COMPARISON:  None. FINDINGS: Two spot images demonstrate a biliary stent in the common bile duct. IMPRESSION: See above. These images were submitted for radiologic interpretation only. Please see the procedural report for the amount of contrast and the fluoroscopy time utilized. Electronically Signed   By: Jolaine Click M.D.   On: 04/30/2018 12:56    Medications: I have reviewed the patient's current medications.  Assessment: Bile leak, 10 French x9 cm plastic CBD stent placed, gradually decreasing output from JP drain, 70 mL in the last 24 hours.  Biliary peritonitis due to gallbladder fossa abscess ,aerobic/anaerobic culture from intra-abdominal drain growing MODERATE SERRATIA MARCESCENS, FEW KLEBSIELLA PNEUMONIAE from 04/27/2018, on IV cefepime and IV  Flagyl  Respiratory failure, remains intubated Normocytic anemia Renal impairment, acidosis  Plan: There seems to be improvement in bile leak post stent placement. GI will follow from periphery. CBD stent will need to be removed electively in 4 to 8 weeks, depending upon resolution of bile leak. Patient remains critical and intubated.   Kerin Salen 05/02/2018, 10:08 AM   Pager [phone] If no answer or after 5 PM call [phone]

## 2018-05-02 NOTE — Progress Notes (Signed)
RT called to patient room due to patient self extubating.  Upon arrival, patient was on 4L nasal cannula, no distress noted, sats of 100% and vitals were stable.  No evidence of stridor.  RN was paging MD to notify.  Will continue to monitor.

## 2018-05-02 NOTE — Progress Notes (Addendum)
eLink Physician-Brief Progress Note Patient Name: Vincent Barrett DOB: 10/15/1925 MRN: [id number]   Date of Service  05/02/2018  HPI/Events of Note  Repeat K+ remains > 7.5.  eICU Interventions  Will order: 1. Kayexalate enema 30 gm PR now.  2. Repeat BMP at 1 AM.     Intervention Category Major Interventions: Electrolyte abnormality - evaluation and management  Sommer,Steven Eugene 05/02/2018, 8:14 PM

## 2018-05-02 NOTE — Anesthesia Postprocedure Evaluation (Addendum)
Anesthesia Post Note  Patient: Vincent Barrett  Procedure(s) Performed: ENDOSCOPIC RETROGRADE CHOLANGIOPANCREATOGRAPHY (ERCP) (N/A ) BILIARY STENT PLACEMENT     Patient location during evaluation: ICU Anesthesia Type: General Level of consciousness: sedated and patient remains intubated per anesthesia plan Pain management: pain level controlled Vital Signs Assessment: post-procedure vital signs reviewed and stable Respiratory status: patient remains intubated per anesthesia plan Cardiovascular status: blood pressure returned to baseline and stable Postop Assessment: no apparent nausea or vomiting Anesthetic complications: no    Last Vitals:  Vitals:   05/02/18 1830 05/02/18 1845  BP: 126/65 126/62  Pulse: 98 91  Resp: 18 19  Temp:    SpO2: 100% 100%    Last Pain:  Vitals:   05/02/18 1620  TempSrc: Oral  PainSc:                  Chelsey L Woodrum

## 2018-05-02 NOTE — Progress Notes (Signed)
2 Days Post-Op   Subjective/Chief Complaint: On vent   Objective: Vital signs in last 24 hours: Temp:  [97.3 F (36.3 C)-98.7 F (37.1 C)] 98.6 F (37 C) (03/22 0352) Pulse Rate:  [62-100] 72 (03/22 0753) Resp:  [18-22] 22 (03/22 0753) BP: (90-122)/(45-77) 108/56 (03/22 0753) SpO2:  [94 %-100 %] 100 % (03/22 0753) FiO2 (%):  [30 %] 30 % (03/22 0753) Weight:  [91.9 kg] 91.9 kg (03/22 0500) Last BM Date: 04/27/18  Intake/Output from previous day: 03/21 0701 - 03/22 0700 In: 1232.5 [I.V.:587.5; NG/GT:340; IV Piggyback:300] Out: 2254 [MGNOI:3704; Drains:70] Intake/Output this shift: No intake/output data recorded.  General appearance: no distress Resp: few rhonchi GI: soft, drain is bilious  Lab Results:  Recent Labs    05/01/18 0350 05/01/18 1254 05/02/18 0347  WBC 11.9*  --  10.4  HGB 6.8* 8.6* 8.9*  HCT 22.1* 26.9* 27.1*  PLT 284  --  304   BMET Recent Labs    04/30/18 1932 05/02/18 0347  NA 144 143  K 4.5 3.8  CL 120* 116*  CO2 18* 21*  GLUCOSE 146* 116*  BUN 51* 49*  CREATININE 2.89* 2.89*  CALCIUM 7.4* 7.5*   PT/INR Recent Labs    04/30/18 0417  LABPROT 19.4*  INR 1.7*   ABG No results for input(s): PHART, HCO3 in the last 72 hours.  Invalid input(s): PCO2, PO2  Studies/Results: Dg Abd 1 View  Result Date: 05/01/2018 CLINICAL DATA:  Status post OG tube placement. EXAM: ABDOMEN - 1 VIEW COMPARISON:  Single-view of the abdomen 05/01/2018. FINDINGS: OG tube is in place with the tip in the distal third portion of the duodenum. IMPRESSION: As above. Electronically Signed   By: Drusilla Kanner M.D.   On: 05/01/2018 13:29   Dg Abd 1 View  Result Date: 05/01/2018 CLINICAL DATA:  Abdominal distension EXAM: ABDOMEN - 1 VIEW COMPARISON:  Plain film of the abdomen dated 04/27/2018. FINDINGS: Bowel gas pattern is nonobstructive. No evidence of soft tissue mass or abnormal fluid collections seen. No evidence of free intraperitoneal air. Pigtail catheter  overlies the RIGHT upper quadrant. Surgical clips in the RIGHT upper quadrant, presumed cholecystectomy. No acute appearing osseous abnormality. IMPRESSION: No acute findings. Nonobstructive bowel gas pattern. Pigtail catheter overlying the RIGHT upper quadrant. Electronically Signed   By: Bary Richard M.D.   On: 05/01/2018 11:19   Dg Chest Port 1 View  Result Date: 05/02/2018 CLINICAL DATA:  85 year old male with history of acute respiratory failure. EXAM: PORTABLE CHEST 1 VIEW COMPARISON:  Chest x-ray 05/01/2018. FINDINGS: An endotracheal tube is in place with tip 4.2 cm above the carina. There is a left-sided internal jugular central venous catheter with tip terminating in the distal superior vena cava. A nasogastric tube is seen extending into the stomach, however, the tip of the nasogastric tube extends below the lower margin of the image. Opacities are noted throughout the left mid to lower lung, concerning for areas of airspace consolidation, as well as superimposed areas of atelectasis. Small left pleural effusion. Right lung appears relatively clear. Possible trace right pleural effusion. Diffuse peribronchial cuffing. No evidence of pulmonary edema. Heart size is normal. The patient is rotated to the left on today's exam, resulting in distortion of the mediastinal contours and reduced diagnostic sensitivity and specificity for mediastinal pathology. Aortic atherosclerosis. IMPRESSION: 1. Support apparatus, as above. 2. Slight improvement in lung volumes, but otherwise, the radiographic appearance the chest is essentially unchanged, as above. Electronically Signed   By:  Trudie Reed M.D.   On: 05/02/2018 05:11   Dg Chest Port 1 View  Result Date: 05/01/2018 CLINICAL DATA:  Acute respiratory failure EXAM: PORTABLE CHEST 1 VIEW COMPARISON:  Chest x-ray dated 04/29/2018. FINDINGS: Endotracheal tube appears adequately positioned with tip approximately 3 cm above the carina. LEFT IJ central line  appears adequately positioned with tip at the level of the mid/lower SVC. Enteric tube is been retracted to the level of the midthoracic esophagus. Heart size and mediastinal contours are stable. Dense opacity at the LEFT lung base, atelectasis versus pneumonia. Probable small LEFT pleural effusion. LEFT perihilar platelike opacity, consistent with atelectasis and/or edema. RIGHT lung remains relatively clear. IMPRESSION: 1. Enteric/NG tube has been retracted to the level of the midthoracic esophagus. Recommend advancing to the stomach. 2. Endotracheal tube adequately positioned with tip approximately 3 cm above the carina. 3. Dense opacity at the LEFT lung base, atelectasis versus pneumonia. Probable small LEFT pleural effusion. These results will be called to the ordering clinician or representative by the Radiologist Assistant, and communication documented in the PACS or zVision Dashboard. Electronically Signed   By: Bary Richard M.D.   On: 05/01/2018 11:23   Dg Ercp  Result Date: 04/30/2018 CLINICAL DATA:  Biliary stent EXAM: ERCP TECHNIQUE: Multiple spot images obtained with the fluoroscopic device and submitted for interpretation post-procedure. FLUOROSCOPY TIME:  Fluoroscopy Time:  34 seconds Radiation Exposure Index (if provided by the fluoroscopic device): Number of Acquired Spot Images: 0 COMPARISON:  None. FINDINGS: Two spot images demonstrate a biliary stent in the common bile duct. IMPRESSION: See above. These images were submitted for radiologic interpretation only. Please see the procedural report for the amount of contrast and the fluoroscopy time utilized. Electronically Signed   By: Jolaine Click M.D.   On: 04/30/2018 12:56    Anti-infectives: Anti-infectives (From admission, onward)   Start     Dose/Rate Route Frequency Ordered Stop   04/27/18 1800  vancomycin (VANCOCIN) IVPB 750 mg/150 ml premix  Status:  Discontinued     750 mg 150 mL/hr over 60 Minutes Intravenous Every 24 hours  04/26/18 1634 04/27/18 0730   04/27/18 1600  ceFEPIme (MAXIPIME) 2 g in sodium chloride 0.9 % 100 mL IVPB  Status:  Discontinued     2 g 200 mL/hr over 30 Minutes Intravenous Every 24 hours 04/26/18 1629 04/27/18 1412   04/27/18 1600  ceFEPIme (MAXIPIME) 1 g in sodium chloride 0.9 % 100 mL IVPB     1 g 200 mL/hr over 30 Minutes Intravenous Every 24 hours 04/27/18 1412     04/26/18 2359  metroNIDAZOLE (FLAGYL) IVPB 500 mg     500 mg 100 mL/hr over 60 Minutes Intravenous Every 8 hours 04/26/18 2325     04/26/18 1700  vancomycin (VANCOCIN) 1,500 mg in sodium chloride 0.9 % 500 mL IVPB     1,500 mg 250 mL/hr over 120 Minutes Intravenous  Once 04/26/18 1557 04/26/18 2059   04/26/18 1600  ceFEPIme (MAXIPIME) 2 g in sodium chloride 0.9 % 100 mL IVPB     2 g 200 mL/hr over 30 Minutes Intravenous  Once 04/26/18 1553 04/26/18 1650   04/26/18 1600  metroNIDAZOLE (FLAGYL) IVPB 500 mg     500 mg 100 mL/hr over 60 Minutes Intravenous  Once 04/26/18 1553 04/26/18 1830   04/26/18 1600  vancomycin (VANCOCIN) IVPB 1000 mg/200 mL premix  Status:  Discontinued     1,000 mg 200 mL/hr over 60 Minutes Intravenous  Once 04/26/18  1553 04/26/18 1557      Assessment/Plan: s/p Procedure(s): ENDOSCOPIC RETROGRADE CHOLANGIOPANCREATOGRAPHY (ERCP) (N/A) BILIARY STENT PLACEMENT Status post laparoscopic cholecystectomy 04/16/2018 by Dr. Corliss Skains Gallbladder fossa abscess- IV ABX,drain inserted with bilious output.  ERCP/stent 3/20; growing serratia PNA, Acute kidney injury- per primary. Respiratory failure likely secondary to pneumonia. Worsening on last CXR Coagulopathy Anemia  FEN- tube feeds as tolerates  LOS: 6 days    Vincent Barrett 05/02/2018

## 2018-05-02 NOTE — Progress Notes (Signed)
NAME:  Vincent Barrett, MRN:  [id number], DOB:  1926/01/25, LOS: 6 ADMISSION DATE:  04/26/2018, CONSULTATION DATE:  04/27/2018 REFERRING MD: Hanley Ben, CHIEF COMPLAINT:  Respiratory Distress, Suspected sepsis   Brief History    85 year old male with history of paroxysmal atrial fibrillation on Eliquis, hypertension, COPD, CKD stage III, BPH, recent acute calculus cholecystitis status post laparoscopic cholecystectomy on 04/16/2018 followed by ERCP with biliary sphincterotomy and balloon extraction of the CBD stone on 04/22/2018 adm with abdominal pain and shortness of breath on 04/26/2018.  He was found to be febrile and tachycardic with significant leukocytosis.  CT abdomen and pelvis showed possible gallbladder fossa abscess versus biloma.  Decompensated due to biliary peritonitis and subsequently underwent IR drain followed by ERCP and stenting on 3/20  Past Medical History   Past Medical History:  Diagnosis Date  . BPH (benign prostatic hyperplasia)   . COPD (chronic obstructive pulmonary disease) (HCC)   . Hypertension   . OA (osteoarthritis)   . PAF (paroxysmal atrial fibrillation) (HCC)     Significant Hospital Events   3/12 - endoscopy, ERCP 3/16 Admission -  3/17 Transfer to ICU 3/17 Intubation and cvl - Intubated and Central Line placed for FFP and Levophed infusions.  ABG pending. CXR for CVC placement pending 3/18 - pall care holding off due to wife wantnig full Rx.40% fio2, fent gtt, levophed . Gets agitated easily preventing SBT 04/28/17 - GI planning ERCP. n vent. RN says HOH + and his hearing aids not here.  3/20  - ercp with cbd stent for bile leak but there was oozing of blood and FFP/Vit K given by GI. Dr Marca Ancona concerned that stent might bget blocked,. Renal failure worse - creat 3  3/21 good response to lasix  Consults:  3/17>> IR 3/17>> Surgery  GI  Procedures:  3/17>> Intubation 3/17 L IJ CVC 3/12 ERCP 3/12 Upper Endoscopy  Significant Diagnostic Tests:   3/16>>CT Abdomen/ Pelvis  Interval cholecystectomy. 7.6 x 10 x 17 cm fluid collection emanating from the gallbladder fossa and tracking posteriorly with a  few bubbles of air within the collection which may reflect a biloma  versus abscess.  Micro Data:  3/16 Blood >>ng 3/16 Urine >>ng 3/17 peritoneal fluid >> serratia, Klebsiella 3/17 Urine Strep >neg 3/17 Urine Legionella neg  Antimicrobials:  Cefepime 3/17>> Flagyl 3/17>> Vanc 3/16 >> stopped   Interim history/subjective:   Remains critically ill but improving Off pressors Good response to Lasix 80 mg given yesterday Sedated on fentanyl 225   Objective   Blood pressure 104/60, pulse 76, temperature (!) 96.4 F (35.8 C), temperature source Axillary, resp. rate (!) 22, height 5\' 6"  (1.676 m), weight 91.9 kg, SpO2 100 %.    Vent Mode: PRVC FiO2 (%):  [30 %] 30 % Set Rate:  [22 bmp] 22 bmp Vt Set:  [510 mL] 510 mL PEEP:  [5 cmH20] 5 cmH20 Plateau Pressure:  [20 cmH20-24 cmH20] 20 cmH20   Intake/Output Summary (Last 24 hours) at 05/02/2018 1059 Last data filed at 05/02/2018 0800 Gross per 24 hour  Intake 962.16 ml  Output 2155 ml  Net -1192.84 ml   Filed Weights   04/29/18 0245 05/01/18 0200 05/02/18 0500  Weight: 84.4 kg 91.8 kg 91.9 kg   Exam unchanged  Elderly man, orally intubated, no distress, acutely ill Sedated on fentanyl drip, RA SS 0, eyes open and follows commands Mild pallor, no icterus, no JVD Decreased breath sounds bilateral, Soft mildly distended abdomen, hypoactive bowel sounds, decreased  bilious drain in pouch S1-S2 irregular Anasarca, massive edema of scrotum   Chest x-ray 3/22 personally reviewed which shows unchanged bilateral airspace disease   Resolved Hospital Problem list     Assessment & Plan:  Acute Respiratory Failure in setting of aspiration pneumonia  and peritonitis History of COPD Intubated 3/17>> Suspected aspiration  Plan: Start spontaneous breathing trials but would  need extensive diuresis before attempting extubation VAP prevention   Septic shock -resolved   Acute Kidney Injury -Baseline creatinine 1.5  Plan  dc IV fluids Response to 80 mg Lasix, will start Lasix drip at 8 mg/h and titrate to effect, aim for urine output 100 -150cc an hour  Recheck bmet today  Biliary peritonitis due to gall bladder fossa abscess -> s/p ERCP with stent decrease in biliary drainage Serratia Plan: Change from cefepime to ceftriaxone NPO Tolerating trickle tube feeds    Paroxysmal atrial fibrillation- was on  Eliquis ./ Vitamin K,  5 mg given , FFP 4 units   Plan: Last INR 1.7 and vitamin K given, due to bleeding during ERCP procedure Goal hemoglobin 7 Will have to resume anticoagulation at some point   Endocrine Plan: CBG's Q 4 SSI   Goals of care 3/20 - No CPR, NO LTAC, NO TRACH, NO SNF,. NO CRRT -> full medical care otherwise.   Best practice:  Diet: NPO Pain/Anxiety/Delirium protocol (if indicated): Fentanyl gtt/ Versed prn VAP protocol (if indicated): Ordered DVT prophylaxis: SCDs, resume AC at some point GI prophylaxis: Protonix Glucose control: CBG/ SSI Mobility: BR Code Status: Full Family Communication: . Wife Alona Bene   Disposition: ICU      ATTESTATION & SIGNATURE   The patient is critically ill with multiple organ systems failure and requires high complexity decision making for assessment and support, frequent evaluation and titration of therapies, application of advanced monitoring technologies and extensive interpretation of multiple databases. Critical Care Time devoted to patient care services described in this note independent of APP/resident  time is 35 minutes.   Cyril Mourning MD. Tonny Bollman. [facility] Pager [phone] If no response call [phone]       05/02/2018 10:59 AM

## 2018-05-02 NOTE — Progress Notes (Signed)
1325 after 3 hours off weaning on minimal sedation and doing well pt self extubated. Respiratory therapist was notified as well as CCM. PT was put on 4L [place] and turned off sedation. Orders to monitor patient.    1635 Pt on 4L [place] sating 100%. Vitals look good. Lungs are diminished and rhonchus upon ausculation. Pt follows commands and is currently resting with eyes closed.

## 2018-05-03 ENCOUNTER — Inpatient Hospital Stay (HOSPITAL_COMMUNITY): Payer: Medicare Other

## 2018-05-03 DIAGNOSIS — K810 Acute cholecystitis: Secondary | ICD-10-CM

## 2018-05-03 LAB — BPAM FFP
Blood Product Expiration Date: 202003222359
Unit Type and Rh: 8400

## 2018-05-03 LAB — BASIC METABOLIC PANEL
Anion gap: 10 (ref 5–15)
BUN: 48 mg/dL — AB (ref 8–23)
CO2: 26 mmol/L (ref 22–32)
Calcium: 8.2 mg/dL — ABNORMAL LOW (ref 8.9–10.3)
Chloride: 111 mmol/L (ref 98–111)
Creatinine, Ser: 3.1 mg/dL — ABNORMAL HIGH (ref 0.61–1.24)
GFR calc Af Amer: 19 mL/min — ABNORMAL LOW (ref >60)
GFR calc non Af Amer: 17 mL/min — ABNORMAL LOW (ref >60)
GLUCOSE: 96 mg/dL (ref 70–99)
Potassium: 3.3 mmol/L — ABNORMAL LOW (ref 3.5–5.1)
Sodium: 147 mmol/L — ABNORMAL HIGH (ref 135–145)

## 2018-05-03 LAB — GLUCOSE, CAPILLARY
GLUCOSE-CAPILLARY: 156 mg/dL — AB (ref 70–99)
GLUCOSE-CAPILLARY: 60 mg/dL — AB (ref 70–99)
Glucose-Capillary: 94 mg/dL (ref 70–99)
Glucose-Capillary: 84 mg/dL (ref 70–99)
Glucose-Capillary: 99 mg/dL (ref 70–99)

## 2018-05-03 LAB — PREPARE FRESH FROZEN PLASMA: Unit division: 0

## 2018-05-03 LAB — BASIC METABOLIC PANEL WITH GFR
Anion gap: 8 (ref 5–15)
BUN: 49 mg/dL — ABNORMAL HIGH (ref 8–23)
CO2: 24 mmol/L (ref 22–32)
Calcium: 8.1 mg/dL — ABNORMAL LOW (ref 8.9–10.3)
Chloride: 113 mmol/L — ABNORMAL HIGH (ref 98–111)
Creatinine, Ser: 3.09 mg/dL — ABNORMAL HIGH (ref 0.61–1.24)
GFR calc Af Amer: 20 mL/min — ABNORMAL LOW
GFR calc non Af Amer: 17 mL/min — ABNORMAL LOW
Glucose, Bld: 97 mg/dL (ref 70–99)
Potassium: 3.4 mmol/L — ABNORMAL LOW (ref 3.5–5.1)
Sodium: 145 mmol/L (ref 135–145)

## 2018-05-03 MED ORDER — DEXTROSE 50 % IV SOLN
1.0000 | Freq: Once | INTRAVENOUS | Status: AC
  Administered 2018-05-03: 50 mL via INTRAVENOUS
  Filled 2018-05-03: qty 50

## 2018-05-03 MED ORDER — DEXTROSE-NACL 5-0.45 % IV SOLN
INTRAVENOUS | Status: DC
  Administered 2018-05-03: 10:00:00 via INTRAVENOUS

## 2018-05-03 MED ORDER — INSULIN ASPART 100 UNIT/ML IV SOLN
10.0000 [IU] | Freq: Once | INTRAVENOUS | Status: AC
  Administered 2018-05-03: 10 [IU] via INTRAVENOUS

## 2018-05-03 MED ORDER — CALCIUM GLUCONATE-NACL 1-0.675 GM/50ML-% IV SOLN
1.0000 g | Freq: Once | INTRAVENOUS | Status: AC
  Administered 2018-05-03: 1000 mg via INTRAVENOUS
  Filled 2018-05-03: qty 50

## 2018-05-03 MED ORDER — DEXTROSE 50 % IV SOLN
12.5000 g | INTRAVENOUS | Status: AC
  Administered 2018-05-03: 12.5 g via INTRAVENOUS
  Filled 2018-05-03: qty 50

## 2018-05-03 MED ORDER — SODIUM BICARBONATE 8.4 % IV SOLN
50.0000 meq | Freq: Once | INTRAVENOUS | Status: AC
  Administered 2018-05-03: 50 meq via INTRAVENOUS
  Filled 2018-05-03: qty 50

## 2018-05-03 MED ORDER — PANTOPRAZOLE SODIUM 40 MG IV SOLR
40.0000 mg | INTRAVENOUS | Status: DC
  Administered 2018-05-03 – 2018-05-06 (×4): 40 mg via INTRAVENOUS
  Filled 2018-05-03 (×4): qty 40

## 2018-05-03 NOTE — Progress Notes (Signed)
Hypoglycemic Event  CBG: 60  Treatment: D50 25 mL (12.5 gm)  Symptoms: None  Follow-up CBG: Time:5:15 AM  CBG Result:64  Possible Reasons for Event: medication regimen  Comments/MD notified:    Della Goo

## 2018-05-03 NOTE — Progress Notes (Signed)
Pt transported to 5W with hearing aids, hearing aid case, and charger.

## 2018-05-03 NOTE — Progress Notes (Signed)
SLP Cancellation Note  Patient Details Name: Tason Larmore MRN: [id number] DOB: [birth year]   Cancelled treatment:       Reason Eval/Treat Not Completed: Fatigue/lethargy limiting ability to participate. Discussed with RN - will hold swallow evaluation at this time but will f/u as able.   Virl Axe Aricka Goldberger 05/03/2018, 10:40 AM  Ivar Drape, M.A. CCC-SLP Acute Herbalist [phone] Office [phone]

## 2018-05-03 NOTE — Progress Notes (Signed)
3 Days Post-Op   Subjective/Chief Complaint: Extubated yesterday. Somnolent this morning.   Objective: Vital signs in last 24 hours: Temp:  [98.8 F (37.1 C)-99 F (37.2 C)] 98.8 F (37.1 C) (03/23 0400) Pulse Rate:  [76-111] 81 (03/23 0645) Resp:  [12-27] 19 (03/23 0645) BP: (104-145)/(52-89) 133/74 (03/23 0645) SpO2:  [93 %-100 %] 100 % (03/23 0645) FiO2 (%):  [30 %] 30 % (03/22 1600) Weight:  [86 kg] 86 kg (03/23 0233) Last BM Date: 04/27/18  Intake/Output from previous day: 03/22 0701 - 03/23 0700 In: 514.4 [I.V.:364.4; IV Piggyback:150] Out: 6445 [KZSWF:0932; Drains:100] Intake/Output this shift: No intake/output data recorded.  General appearance: no distress Resp: few rhonchi GI: soft, drain is bilious  Lab Results:  Recent Labs    05/01/18 0350 05/01/18 1254 05/02/18 0347  WBC 11.9*  --  10.4  HGB 6.8* 8.6* 8.9*  HCT 22.1* 26.9* 27.1*  PLT 284  --  304   BMET Recent Labs    05/02/18 1910 05/03/18 0235  NA 134* 145  K >7.5* 3.4*  CL 108 113*  CO2 20* 24  GLUCOSE 116* 97  BUN 48* 49*  CREATININE 3.28* 3.09*  CALCIUM 7.0* 8.1*   PT/INR No results for input(s): LABPROT, INR in the last 72 hours. ABG No results for input(s): PHART, HCO3 in the last 72 hours.  Invalid input(s): PCO2, PO2  Studies/Results: Dg Abd 1 View  Result Date: 05/02/2018 CLINICAL DATA:  85 year old male NG tube placement. EXAM: ABDOMEN - 1 VIEW COMPARISON:  05/01/2018 and earlier. FINDINGS: Portable AP supine view at 2310 hours. NG tube now in place in the left upper quadrant terminating at the level of the gastric body. Stable right CBD stent and percutaneous pigtail catheter. Stable cholecystectomy clips. Visible bowel gas pattern remains within normal limits. IMPRESSION: 1. Enteric tube now terminates at the gastric body. 2. Right CBD stent and right upper quadrant drainage catheter. Electronically Signed   By: Odessa Fleming M.D.   On: 05/02/2018 23:33   Dg Abd 1 View  Result  Date: 05/01/2018 CLINICAL DATA:  Status post OG tube placement. EXAM: ABDOMEN - 1 VIEW COMPARISON:  Single-view of the abdomen 05/01/2018. FINDINGS: OG tube is in place with the tip in the distal third portion of the duodenum. IMPRESSION: As above. Electronically Signed   By: Drusilla Kanner M.D.   On: 05/01/2018 13:29   Dg Abd 1 View  Result Date: 05/01/2018 CLINICAL DATA:  Abdominal distension EXAM: ABDOMEN - 1 VIEW COMPARISON:  Plain film of the abdomen dated 04/27/2018. FINDINGS: Bowel gas pattern is nonobstructive. No evidence of soft tissue mass or abnormal fluid collections seen. No evidence of free intraperitoneal air. Pigtail catheter overlies the RIGHT upper quadrant. Surgical clips in the RIGHT upper quadrant, presumed cholecystectomy. No acute appearing osseous abnormality. IMPRESSION: No acute findings. Nonobstructive bowel gas pattern. Pigtail catheter overlying the RIGHT upper quadrant. Electronically Signed   By: Bary Richard M.D.   On: 05/01/2018 11:19   Dg Chest Port 1 View  Result Date: 05/03/2018 CLINICAL DATA:  Respiratory distress. EXAM: PORTABLE CHEST 1 VIEW COMPARISON:  Radiograph May 02, 2018. FINDINGS: Stable cardiomegaly. Endotracheal tube is not currently visualized and presumably has been removed. Nasogastric tube is noted entering stomach. Left internal jugular catheter is again noted with tip in expected position of the SVC. No pneumothorax is noted. Right lung is clear. Atherosclerosis of thoracic aorta is noted. Stable left basilar atelectasis or infiltrate is noted with associated pleural effusion. Bony  thorax is unremarkable. IMPRESSION: Endotracheal tube appears to have been removed. Stable left basilar opacity is noted as described above. Electronically Signed   By: Lupita Raider, M.D.   On: 05/03/2018 07:19   Dg Chest Port 1 View  Result Date: 05/02/2018 CLINICAL DATA:  85 year old male with history of acute respiratory failure. EXAM: PORTABLE CHEST 1 VIEW  COMPARISON:  Chest x-ray 05/01/2018. FINDINGS: An endotracheal tube is in place with tip 4.2 cm above the carina. There is a left-sided internal jugular central venous catheter with tip terminating in the distal superior vena cava. A nasogastric tube is seen extending into the stomach, however, the tip of the nasogastric tube extends below the lower margin of the image. Opacities are noted throughout the left mid to lower lung, concerning for areas of airspace consolidation, as well as superimposed areas of atelectasis. Small left pleural effusion. Right lung appears relatively clear. Possible trace right pleural effusion. Diffuse peribronchial cuffing. No evidence of pulmonary edema. Heart size is normal. The patient is rotated to the left on today's exam, resulting in distortion of the mediastinal contours and reduced diagnostic sensitivity and specificity for mediastinal pathology. Aortic atherosclerosis. IMPRESSION: 1. Support apparatus, as above. 2. Slight improvement in lung volumes, but otherwise, the radiographic appearance the chest is essentially unchanged, as above. Electronically Signed   By: Trudie Reed M.D.   On: 05/02/2018 05:11   Dg Chest Port 1 View  Result Date: 05/01/2018 CLINICAL DATA:  Acute respiratory failure EXAM: PORTABLE CHEST 1 VIEW COMPARISON:  Chest x-ray dated 04/29/2018. FINDINGS: Endotracheal tube appears adequately positioned with tip approximately 3 cm above the carina. LEFT IJ central line appears adequately positioned with tip at the level of the mid/lower SVC. Enteric tube is been retracted to the level of the midthoracic esophagus. Heart size and mediastinal contours are stable. Dense opacity at the LEFT lung base, atelectasis versus pneumonia. Probable small LEFT pleural effusion. LEFT perihilar platelike opacity, consistent with atelectasis and/or edema. RIGHT lung remains relatively clear. IMPRESSION: 1. Enteric/NG tube has been retracted to the level of the  midthoracic esophagus. Recommend advancing to the stomach. 2. Endotracheal tube adequately positioned with tip approximately 3 cm above the carina. 3. Dense opacity at the LEFT lung base, atelectasis versus pneumonia. Probable small LEFT pleural effusion. These results will be called to the ordering clinician or representative by the Radiologist Assistant, and communication documented in the PACS or zVision Dashboard. Electronically Signed   By: Bary Richard M.D.   On: 05/01/2018 11:23    Anti-infectives: Anti-infectives (From admission, onward)   Start     Dose/Rate Route Frequency Ordered Stop   05/02/18 1200  cefTRIAXone (ROCEPHIN) 2 g in sodium chloride 0.9 % 100 mL IVPB     2 g 200 mL/hr over 30 Minutes Intravenous Every 24 hours 05/02/18 1100     04/27/18 1800  vancomycin (VANCOCIN) IVPB 750 mg/150 ml premix  Status:  Discontinued     750 mg 150 mL/hr over 60 Minutes Intravenous Every 24 hours 04/26/18 1634 04/27/18 0730   04/27/18 1600  ceFEPIme (MAXIPIME) 2 g in sodium chloride 0.9 % 100 mL IVPB  Status:  Discontinued     2 g 200 mL/hr over 30 Minutes Intravenous Every 24 hours 04/26/18 1629 04/27/18 1412   04/27/18 1600  ceFEPIme (MAXIPIME) 1 g in sodium chloride 0.9 % 100 mL IVPB  Status:  Discontinued     1 g 200 mL/hr over 30 Minutes Intravenous Every 24 hours 04/27/18  1412 05/02/18 1100   04/26/18 2359  metroNIDAZOLE (FLAGYL) IVPB 500 mg  Status:  Discontinued     500 mg 100 mL/hr over 60 Minutes Intravenous Every 8 hours 04/26/18 2325 05/02/18 1058   04/26/18 1700  vancomycin (VANCOCIN) 1,500 mg in sodium chloride 0.9 % 500 mL IVPB     1,500 mg 250 mL/hr over 120 Minutes Intravenous  Once 04/26/18 1557 04/26/18 2059   04/26/18 1600  ceFEPIme (MAXIPIME) 2 g in sodium chloride 0.9 % 100 mL IVPB     2 g 200 mL/hr over 30 Minutes Intravenous  Once 04/26/18 1553 04/26/18 1650   04/26/18 1600  metroNIDAZOLE (FLAGYL) IVPB 500 mg     500 mg 100 mL/hr over 60 Minutes Intravenous   Once 04/26/18 1553 04/26/18 1830   04/26/18 1600  vancomycin (VANCOCIN) IVPB 1000 mg/200 mL premix  Status:  Discontinued     1,000 mg 200 mL/hr over 60 Minutes Intravenous  Once 04/26/18 1553 04/26/18 1557      Assessment/Plan: s/p Procedure(s): ENDOSCOPIC RETROGRADE CHOLANGIOPANCREATOGRAPHY (ERCP) (N/A) BILIARY STENT PLACEMENT Status post laparoscopic cholecystectomy 04/16/2018 by Dr. Corliss Skains Gallbladder fossa abscess- IV ABX,drain inserted with bilious output.  ERCP/stent 3/20; growing serratia PNA, Acute kidney injury- per primary. Respiratory failure likely secondary to pneumonia. PNA vs atelectasis stable on last CXR Coagulopathy Anemia  FEN- tube feeds as tolerates  LOS: 7 days    Andria Meuse 05/03/2018

## 2018-05-03 NOTE — Progress Notes (Signed)
eLink Physician-Brief Progress Note Patient Name: Vincent Barrett DOB: 1926/01/05 MRN: [id number]   Date of Service  05/03/2018  HPI/Events of Note  Hyperkalemia - Patient vomited Kayexalate given by gastric tube.   eICU Interventions  Will order: 1. NaHCO3 50 meq IV now.  2. Calcium gluconate 1 gm IV now. 3 .D50 1 amp IV now.  4. Novolog insulin 10 units IV now.  5. BMP at 2 AM.      Intervention Category Major Interventions: Electrolyte abnormality - evaluation and management  Sommer,Steven Eugene 05/03/2018, 12:07 AM

## 2018-05-03 NOTE — Progress Notes (Signed)
Palliative Medicine RN Note: Consult from 3/17 remains incomplete at Dr [facility] request; he spoke with PMT NP Harvest Dark at that time & requested we hold off unless/until called.   Please contact our office if the physicians would like PMT to get involved.  Margret Chance Dynasti Kerman, RN, BSN, [facility] Palliative Medicine Team 05/03/2018 10:52 AM Office [phone]

## 2018-05-03 NOTE — Progress Notes (Signed)
Patient ID: Vincent Barrett, male   DOB: Sep 14, 1925, 85 y.o.   MRN: [id number]  IR note via phone per new regulations  RUQ; subhepatic abscess drain placed 3/17 OP 100 cc yesterday Bilious Flushes easily Site is C/D/I per RN  05/02/2018 FINAL    Organism ID, Bacteria SERRATIA MARCESCENS   Organism ID, Bacteria KLEBSIELLA PNEUMONIAE     Will follow Pt has Tx to 5W Extubated

## 2018-05-03 NOTE — Progress Notes (Signed)
  Pt orientation to unit, room and routine. Information packet given to patient/family and safety video watched.  Admission INP armband ID verified with patient/family, and in place. SR up x 2, fall risk assessment complete with Patient and family verbalizing understanding of risks associated with falls. Pt verbalizes an understanding of how to use the call bell and to call for help before getting out of bed.  Skin, clean-dry- intact without evidence of bruising. Pt place on the progressive monitor NGT tube in place to low intermittent suction.  Will cont to monitor and assist as needed.  Brysan Mcevoy Sherlynn Carbon, RN 05/03/2018 3:12 PM

## 2018-05-03 NOTE — Progress Notes (Signed)
NAME:  Vincent Barrett, MRN:  [id number], DOB:  Oct 25, 1925, LOS: 7 ADMISSION DATE:  04/26/2018, CONSULTATION DATE:  04/27/2018 REFERRING MD: Hanley Ben, CHIEF COMPLAINT:  Respiratory Distress, Suspected sepsis   Brief History   85 yo male had lap chole for acute calculus cholecystitis 04/16/18 followed by ERCP with sphincterotomy and balloon extraction of stone on 04/22/18 presented with dyspnea, fever and abdominal pain with concern for GB fossa abscess.   Past Medical History  A fib on eliquis, COPD, HTN, OA, CKD 3, BPH  Significant Hospital Events   3/16 Admission  3/17 Transfer to ICU, VDRF, pressors, transfuse FFP 3/20 ERCP with CBD stent 3/22 Self extubated, treatment for hyperkalemia  Consults:  GI  IR Surgery  Procedures:  ETT 3/17 >> Lt IJ CVL 3/17 >> 3/22  Significant Diagnostic Tests:  CT abd/pelvis 3/16 >> 17 cm fluid collection in GB fossa  Micro Data:  Urine 3/16 >> negative Blood 3/16 >> negative Biliary drain 3/17 >> Serratia, Klebsiella  Antimicrobials:  Cefepime 3/16 >> 3/21 Flagyl 3/16 >> 3/21 Vancomycin 3/16 >> 3/16 Rocephin 3/22 >>   Interim history/subjective:  Vomiting over night after self extubation.  Objective   Blood pressure 121/61, pulse 82, temperature 97.8 F (36.6 C), temperature source Oral, resp. rate 20, height 5\' 6"  (1.676 m), weight 86 kg, SpO2 100 %.    Vent Mode: PSV;CPAP FiO2 (%):  [30 %] 30 % PEEP:  [5 cmH20] 5 cmH20 Pressure Support:  [15 cmH20] 15 cmH20 Plateau Pressure:  [17 cmH20] 17 cmH20   Intake/Output Summary (Last 24 hours) at 05/03/2018 0912 Last data filed at 05/03/2018 0800 Gross per 24 hour  Intake 485.39 ml  Output 7195 ml  Net -6709.61 ml   Filed Weights   05/01/18 0200 05/02/18 0500 05/03/18 0233  Weight: 91.8 kg 91.9 kg 86 kg    General - somnolent Eyes - pupils reactive ENT - no sinus tenderness, no stridor Cardiac - iregular rate/rhythm, no murmur Chest - no wheeze Abdomen - soft, decreased bowel  sounds, mild distention, increased tympany Extremities - 1+ edema Skin - no rashes Neuro - opens eyes and moves extremities with stimulation, not following commands  CXR (reviewed by me) - Lt basilar ASD  Resolved Hospital Problem list   Septic shock.  Assessment & Plan:   Acute respiratory failure with hypoxia from aspiration pneumonia in setting of biliary abscess with sepsis. Hx of COPD. Plan - oxygen to keep SpO2 90 to 95% - f/u CXR intermittently - prn BDs  Biliary abscess after cholecystectomy. Vomiting with Ileus. Moderate protein calorie malnutrition. Plan - surgery, IR following - GI will follow as needed per note from 3/22 - day 8 of Abx, currently on rocephin - hold tube feeds for now  AKI from ATN. CKD 3 - baseline creatinine 1.5. Hyperkalemia. Plan - monitor renal fx, urine outpt - family would not want renal replacement - f/u electrolytes - d/c lasix gtt  Paroxysmal atrial fibrillation. Hx of HTN. Plan - monitor on tele - hold outpt norvasc, cardura, hyzaar, toprol, eliquis for now  Anemia of critical illness. Plan - f/u CBC   Best practice:  Diet: NPO DVT prophylaxis: SCDs GI prophylaxis: Protonix Mobility: bed rest Code Status: no CPR, no defibrillation Family Communication:  Disposition: ICU   Labs:   CMP Latest Ref Rng & Units 05/03/2018 05/02/2018 05/02/2018  Glucose 70 - 99 mg/dL 97 802(M) 336(P)  BUN 8 - 23 mg/dL 22(E) 49(P) 53(Y)  Creatinine 0.61 - 1.24 mg/dL  3.09(H) 3.28(H) 3.17(H)  Sodium 135 - 145 mmol/L 145 134(L) 138  Potassium 3.5 - 5.1 mmol/L 3.4(L) >7.5(HH) >7.5(HH)  Chloride 98 - 111 mmol/L 113(H) 108 113(H)  CO2 22 - 32 mmol/L 24 20(L) 21(L)  Calcium 8.9 - 10.3 mg/dL 8.1(L) 7.0(L) 7.3(L)  Total Protein 6.5 - 8.1 g/dL - - -  Total Bilirubin 0.3 - 1.2 mg/dL - - -  Alkaline Phos 38 - 126 U/L - - -  AST 15 - 41 U/L - - -  ALT 0 - 44 U/L - - -   CBC Latest Ref Rng & Units 05/02/2018 05/01/2018 05/01/2018  WBC 4.0 - 10.5  K/uL 10.4 - 11.9(H)  Hemoglobin 13.0 - 17.0 g/dL 1.5(P) 7.9(K) 6.8(LL)  Hematocrit 39.0 - 52.0 % 27.1(L) 26.9(L) 22.1(L)  Platelets 150 - 400 K/uL 304 - 284   ABG    Component Value Date/Time   PHART 7.403 04/27/2018 1657   PCO2ART 33.2 04/27/2018 1657   PO2ART 252.0 (H) 04/27/2018 1657   HCO3 20.7 04/27/2018 1657   TCO2 22 04/27/2018 1657   ACIDBASEDEF 3.0 (H) 04/27/2018 1657   O2SAT 100.0 04/27/2018 1657   CBG (last 3)  Recent Labs    05/03/18 0439 05/03/18 0514 05/03/18 0803  GLUCAP 60* 94 84    Will ask [facility] to assume care from 3/24 and PCCM off  Coralyn Helling, MD [facility] Pulmonary/Critical Care 05/03/2018, 9:38 AM

## 2018-05-04 ENCOUNTER — Inpatient Hospital Stay (HOSPITAL_COMMUNITY): Payer: Medicare Other

## 2018-05-04 DIAGNOSIS — R1011 Right upper quadrant pain: Secondary | ICD-10-CM

## 2018-05-04 DIAGNOSIS — R188 Other ascites: Secondary | ICD-10-CM

## 2018-05-04 LAB — URINALYSIS, ROUTINE W REFLEX MICROSCOPIC
BILIRUBIN URINE: NEGATIVE
Glucose, UA: NEGATIVE mg/dL
KETONES UR: NEGATIVE mg/dL
Nitrite: NEGATIVE
Protein, ur: NEGATIVE mg/dL
Specific Gravity, Urine: 1.005 (ref 1.005–1.030)
WBC, UA: >50 WBC/hpf — ABNORMAL HIGH (ref 0–5)
pH: 6 (ref 5.0–8.0)

## 2018-05-04 LAB — BASIC METABOLIC PANEL
Anion gap: 10 (ref 5–15)
BUN: 45 mg/dL — ABNORMAL HIGH (ref 8–23)
CALCIUM: 7.8 mg/dL — AB (ref 8.9–10.3)
CO2: 27 mmol/L (ref 22–32)
Chloride: 108 mmol/L (ref 98–111)
Creatinine, Ser: 2.89 mg/dL — ABNORMAL HIGH (ref 0.61–1.24)
GFR calc Af Amer: 21 mL/min — ABNORMAL LOW (ref >60)
GFR calc non Af Amer: 18 mL/min — ABNORMAL LOW (ref >60)
Glucose, Bld: 134 mg/dL — ABNORMAL HIGH (ref 70–99)
Potassium: 3.1 mmol/L — ABNORMAL LOW (ref 3.5–5.1)
Sodium: 145 mmol/L (ref 135–145)

## 2018-05-04 LAB — CULTURE, RESPIRATORY W GRAM STAIN

## 2018-05-04 LAB — CBC
HCT: 29.1 % — ABNORMAL LOW (ref 39.0–52.0)
Hemoglobin: 9.6 g/dL — ABNORMAL LOW (ref 13.0–17.0)
MCH: 30.8 pg (ref 26.0–34.0)
MCHC: 33 g/dL (ref 30.0–36.0)
MCV: 93.3 fL (ref 80.0–100.0)
Platelets: 323 10*3/uL (ref 150–400)
RBC: 3.12 MIL/uL — ABNORMAL LOW (ref 4.22–5.81)
RDW: 13.7 % (ref 11.5–15.5)
WBC: 11.6 10*3/uL — ABNORMAL HIGH (ref 4.0–10.5)
nRBC: 0 % (ref 0.0–0.2)

## 2018-05-04 MED ORDER — POTASSIUM CHLORIDE 20 MEQ/15ML (10%) PO SOLN
40.0000 meq | Freq: Two times a day (BID) | ORAL | Status: AC
  Administered 2018-05-04 (×2): 40 meq
  Filled 2018-05-04 (×2): qty 30

## 2018-05-04 MED ORDER — VITAL AF 1.2 CAL PO LIQD
1000.0000 mL | ORAL | Status: DC
  Administered 2018-05-04: 1000 mL
  Filled 2018-05-04 (×2): qty 1000

## 2018-05-04 MED ORDER — FUROSEMIDE 10 MG/ML IJ SOLN
40.0000 mg | Freq: Two times a day (BID) | INTRAMUSCULAR | Status: AC
  Administered 2018-05-04 (×2): 40 mg via INTRAVENOUS
  Filled 2018-05-04 (×2): qty 4

## 2018-05-04 MED ORDER — POTASSIUM CHLORIDE 20 MEQ/15ML (10%) PO SOLN: 40.0000 meq | Freq: Once | ORAL | Status: DC

## 2018-05-04 NOTE — Progress Notes (Signed)
4 Days Post-Op  Subjective: CC:  Somnolent this morning.   Objective: Vital signs in last 24 hours: Temp:  [97.7 F (36.5 C)-99.1 F (37.3 C)] 98.4 F (36.9 C) (03/24 0757) Pulse Rate:  [79-103] 91 (03/24 0757) Resp:  [11-24] 24 (03/24 0757) BP: (122-148)/(65-87) 136/68 (03/24 0757) SpO2:  [99 %-100 %] 100 % (03/24 0757) Weight:  [80.5 kg] 80.5 kg (03/24 0409) Last BM Date: 04/27/18  Intake/Output from previous day: 03/23 0701 - 03/24 0700 In: 104.6 [I.V.:104.6] Out: 3900 [Urine:3350; Drains:50; Stool:500] Intake/Output this shift: No intake/output data recorded.  PE: Gen: NAD Lungs: Normal effort, rhonchi noted  Abd: Soft, mild distension, tenderness of the RUQ and epigastrium. +BS. Drain in place with bilious output. 50cc overnight.   Lab Results:  Recent Labs    05/02/18 0347 05/04/18 0359  WBC 10.4 11.6*  HGB 8.9* 9.6*  HCT 27.1* 29.1*  PLT 304 323   BMET Recent Labs    05/03/18 1002 05/04/18 0359  NA 147* 145  K 3.3* 3.1*  CL 111 108  CO2 26 27  GLUCOSE 96 134*  BUN 48* 45*  CREATININE 3.10* 2.89*  CALCIUM 8.2* 7.8*   PT/INR No results for input(s): LABPROT, INR in the last 72 hours. CMP     Component Value Date/Time   NA 145 05/04/2018 0359   NA 138 03/17/2018 1554   K 3.1 (L) 05/04/2018 0359   CL 108 05/04/2018 0359   CO2 27 05/04/2018 0359   GLUCOSE 134 (H) 05/04/2018 0359   BUN 45 (H) 05/04/2018 0359   BUN 20 03/17/2018 1554   CREATININE 2.89 (H) 05/04/2018 0359   CALCIUM 7.8 (L) 05/04/2018 0359   PROT 5.0 (L) 05/01/2018 0350   ALBUMIN 1.8 (L) 05/01/2018 0350   AST 36 05/01/2018 0350   ALT 28 05/01/2018 0350   ALKPHOS 72 05/01/2018 0350   BILITOT 0.8 05/01/2018 0350   GFRNONAA 18 (L) 05/04/2018 0359   GFRAA 21 (L) 05/04/2018 0359   Lipase     Component Value Date/Time   LIPASE 35 04/26/2018 1508       Studies/Results: Dg Abd 1 View  Result Date: 05/02/2018 CLINICAL DATA:  85 year old male NG tube placement. EXAM:  ABDOMEN - 1 VIEW COMPARISON:  05/01/2018 and earlier. FINDINGS: Portable AP supine view at 2310 hours. NG tube now in place in the left upper quadrant terminating at the level of the gastric body. Stable right CBD stent and percutaneous pigtail catheter. Stable cholecystectomy clips. Visible bowel gas pattern remains within normal limits. IMPRESSION: 1. Enteric tube now terminates at the gastric body. 2. Right CBD stent and right upper quadrant drainage catheter. Electronically Signed   By: Odessa Fleming M.D.   On: 05/02/2018 23:33   Dg Chest Port 1 View  Result Date: 05/03/2018 CLINICAL DATA:  Respiratory distress. EXAM: PORTABLE CHEST 1 VIEW COMPARISON:  Radiograph May 02, 2018. FINDINGS: Stable cardiomegaly. Endotracheal tube is not currently visualized and presumably has been removed. Nasogastric tube is noted entering stomach. Left internal jugular catheter is again noted with tip in expected position of the SVC. No pneumothorax is noted. Right lung is clear. Atherosclerosis of thoracic aorta is noted. Stable left basilar atelectasis or infiltrate is noted with associated pleural effusion. Bony thorax is unremarkable. IMPRESSION: Endotracheal tube appears to have been removed. Stable left basilar opacity is noted as described above. Electronically Signed   By: Lupita Raider, M.D.   On: 05/03/2018 07:19   Dg Abd Portable  1v  Result Date: 05/04/2018 CLINICAL DATA:  Follow-up ileus EXAM: PORTABLE ABDOMEN - 1 VIEW COMPARISON:  05/02/2018 FINDINGS: Right upper quadrant drain is again seen and stable. Pneumobilia is noted with a biliary stent in place. Gastric catheter is noted within the stomach. Scattered large and small bowel gas is noted. No obstructive changes are seen. No acute bony abnormality is noted. IMPRESSION: Stable appearance of the abdomen compared with the previous day. Electronically Signed   By: Alcide Clever M.D.   On: 05/04/2018 07:07    Anti-infectives: Anti-infectives (From admission,  onward)   Start     Dose/Rate Route Frequency Ordered Stop   05/02/18 1200  cefTRIAXone (ROCEPHIN) 2 g in sodium chloride 0.9 % 100 mL IVPB     2 g 200 mL/hr over 30 Minutes Intravenous Every 24 hours 05/02/18 1100     04/27/18 1800  vancomycin (VANCOCIN) IVPB 750 mg/150 ml premix  Status:  Discontinued     750 mg 150 mL/hr over 60 Minutes Intravenous Every 24 hours 04/26/18 1634 04/27/18 0730   04/27/18 1600  ceFEPIme (MAXIPIME) 2 g in sodium chloride 0.9 % 100 mL IVPB  Status:  Discontinued     2 g 200 mL/hr over 30 Minutes Intravenous Every 24 hours 04/26/18 1629 04/27/18 1412   04/27/18 1600  ceFEPIme (MAXIPIME) 1 g in sodium chloride 0.9 % 100 mL IVPB  Status:  Discontinued     1 g 200 mL/hr over 30 Minutes Intravenous Every 24 hours 04/27/18 1412 05/02/18 1100   04/26/18 2359  metroNIDAZOLE (FLAGYL) IVPB 500 mg  Status:  Discontinued     500 mg 100 mL/hr over 60 Minutes Intravenous Every 8 hours 04/26/18 2325 05/02/18 1058   04/26/18 1700  vancomycin (VANCOCIN) 1,500 mg in sodium chloride 0.9 % 500 mL IVPB     1,500 mg 250 mL/hr over 120 Minutes Intravenous  Once 04/26/18 1557 04/26/18 2059   04/26/18 1600  ceFEPIme (MAXIPIME) 2 g in sodium chloride 0.9 % 100 mL IVPB     2 g 200 mL/hr over 30 Minutes Intravenous  Once 04/26/18 1553 04/26/18 1650   04/26/18 1600  metroNIDAZOLE (FLAGYL) IVPB 500 mg     500 mg 100 mL/hr over 60 Minutes Intravenous  Once 04/26/18 1553 04/26/18 1830   04/26/18 1600  vancomycin (VANCOCIN) IVPB 1000 mg/200 mL premix  Status:  Discontinued     1,000 mg 200 mL/hr over 60 Minutes Intravenous  Once 04/26/18 1553 04/26/18 1557       Assessment/Plan Acute respiratory failure 2/2 aspiration PNA AKI Paroxysmal A. Fib Anemia  Status post laparoscopic cholecystectomy 04/16/2018 by Dr. Corliss Skains Gallbladder fossa abscess - S/p ERCP with CBD stent on 3/20 - Cx with serratia and klebsiella  - Continue IV abx  - Continue drain - IV ABX,drain inserted with  bilious output.  ERCP/stent 3/20; growing serratia  FEN- tube feeds as tolerates, speech to see patient for swallow study.  ID - Vancomycin 3/16 >> 3/16, Cefepime 3/16 >> 3/21, Flagyl 3/16 >> 3/21. Rocephin 3/22 >> WBC 11.6, afebrile VTE - SCDs    LOS: 8 days    Jacinto Halim , [facility] 05/04/2018, 8:39 AM Pager: [phone]

## 2018-05-04 NOTE — Progress Notes (Addendum)
PROGRESS NOTE    Ezzard Ditmer  ZOX:[id number] DOB: 07-Aug-1925 DOA: 04/26/2018 PCP: [name], MD  Brief Narrative: 89/M underwent Lap CHole on 3/6 followed by ERCP with sphincterotomy and balloon extraction of the stone on 3/12 subsequently discharged home. -Presented to the ED was admitted to the ICU on 3/16 with septic shock and respiratory failure secondary to gallbladder fossa abscess, biliary leak. -Required pressors and mechanical ventilation on admission, was transfused FFP due to chronic Eliquis use. -Underwent ERCP with CBD stent on 3/20 -Self extubated 3/22 -Transferred from [facility] to [facility] today   [place]se daily sment & Plan:   Septic shock/biliary abscess following with bile leak postcholecystectomy -Required mechanical ventilation and pressors in ICU -Improving, extubated and transferred to [facility] today -Status post ERCP and CBD stent on 3/20  -unfortunately remains lethargic,   biliary culture Serratia and Klebsiella both of which are sensitive to ceftriaxone  -currently on Rocephin day 9 -General surgery following, discussed with Dr. Cliffton Asters, attempt to start trickle tube feeds today 3/24  Acute respiratory failure with hypoxia -Also being treated for aspiration pneumonia while in ICU -On day 9 of antibiotics, currently on Rocephin -Start feeds today -Wean O2  Mild Metabolic encephalopathy  -Likely secondary to above abscess, white count only mildly elevated at 11 today, afebrile -Also check urinalysis, discontinued oxycodone -Monitor clinically  Acute kidney injury on CKD stage III -Baseline creatinine is 1.5 -Creatinine improving down to 2.8 today, from peak of 3.2  Paroxysmal atrial fibrillation/HTN -currently Toprol, Eliquis , Norvasc and Cardura on hold  Fluid overload/scrotal swelling -add low dose lasix BID, monitor Creatinine  DVt proph: SCDs  Code status: Partial code, wants mechanical ventilation but declined CPR/ defibrillation Family Communication: none  at bedside Disposition Plan: will need SNF when stable  Consultants:   GI  PCCM  CCS   Procedures:   Antimicrobials:    Subjective: -somnolent, arousable, answers a few questions and goes back to sleep  Objective: Vitals:   05/04/18 0409 05/04/18 0757 05/04/18 0800 05/04/18 0927  BP:  136/68 136/68   Pulse:  91 79   Resp:  (!) 24 (!) 21   Temp:  98.4 F (36.9 C)    TempSrc:  Axillary    SpO2:  100%    Weight: 80.5 kg   85.4 kg  Height:        Intake/Output Summary (Last 24 hours) at 05/04/2018 0944 Last data filed at 05/04/2018 0901 Gross per 24 hour  Intake 109.6 ml  Output 3209 ml  Net -3099.4 ml   Filed Weights   05/03/18 0233 05/04/18 0409 05/04/18 0927  Weight: 86 kg 80.5 kg 85.4 kg    Examination:  General exam: Elderly chronically ill gentleman lying in bed,, answers a few questions only, unable to assess orientation clearly at this time Respiratory system: Decreased breath sounds at both bases  cardiovascular system: S1-S2/irregularly irregular rhythm  gastrointestinal system: Soft, mild right-sided tenderness, bowel sounds present, no rigidity or rebound,  scrotal swelling Central nervous system: Somnolent as above, no localizing signs. Extremities: Trace edema Skin: No rashes, lesions or ulcers Psychiatry: unable to assess    Data Reviewed:   CBC: Recent Labs  Lab 04/28/18 0500 04/29/18 0501 04/30/18 0417 05/01/18 0350 05/01/18 1254 05/02/18 0347 05/04/18 0359  WBC 16.0* 15.4* 14.1* 11.9*  --  10.4 11.6*  NEUTROABS 14.4* 13.3* 11.7* 9.2*  --  7.5  --   HGB 7.3* 7.5* 7.3* 6.8* 8.6* 8.9* 9.6*  HCT 22.8* 24.8* 23.8*  22.1* 26.9* 27.1* 29.1*  MCV 93.8 96.9 97.9 98.7  --  95.1 93.3  PLT 303 311 302 284  --  304 323   Basic Metabolic Panel: Recent Labs  Lab 04/29/18 0501  04/30/18 0417 04/30/18 1932 05/01/18 0350 05/02/18 0347 05/02/18 1817 05/02/18 1910 05/03/18 0235 05/03/18 1002 05/04/18 0359  NA  --    < >  --  144  --   143 138 134* 145 147* 145  K  --    < >  --  4.5  --  3.8 >7.5* >7.5* 3.4* 3.3* 3.1*  CL  --    < >  --  120*  --  116* 113* 108 113* 111 108  CO2  --    < >  --  18*  --  21* 21* 20* 24 26 27   GLUCOSE  --    < >  --  146*  --  116* 115* 116* 97 96 134*  BUN  --    < >  --  51*  --  49* 48* 48* 49* 48* 45*  CREATININE  --    < > 3.02* 2.89*  --  2.89* 3.17* 3.28* 3.09* 3.10* 2.89*  CALCIUM  --    < >  --  7.4*  --  7.5* 7.3* 7.0* 8.1* 8.2* 7.8*  MG 2.2  --  2.1 2.0 2.1 1.8  --   --   --   --   --   PHOS 2.5  --  2.3*  --  2.6 3.0  --   --   --   --   --    < > = values in this interval not displayed.   GFR: Estimated Creatinine Clearance: 17.4 mL/min (A) (by C-G formula based on SCr of 2.89 mg/dL (H)). Liver Function Tests: Recent Labs  Lab 04/27/18 1319 04/28/18 0500 04/29/18 0501 05/01/18 0350  AST 47* 44* 89* 36  ALT 45* 36 45* 28  ALKPHOS 102 139* 84 72  BILITOT 1.8* 1.6* 1.4* 0.8  PROT 5.1* 5.4* 5.1* 5.0*  ALBUMIN 1.7* 2.0* 1.8* 1.8*   No results for input(s): LIPASE, AMYLASE in the last 168 hours. No results for input(s): AMMONIA in the last 168 hours. Coagulation Profile: Recent Labs  Lab 04/27/18 1725 04/28/18 0500 04/29/18 0501 04/30/18 0417  INR 1.7* 2.1* 1.7* 1.7*   Cardiac Enzymes: Recent Labs  Lab 04/27/18 1319 04/27/18 1725 04/28/18 0037 04/28/18 0429  TROPONINI 0.26* 0.19* 0.16* 0.14*   BNP (last 3 results) No results for input(s): PROBNP in the last 8760 hours. HbA1C: No results for input(s): HGBA1C in the last 72 hours. CBG: Recent Labs  Lab 05/03/18 0057 05/03/18 0439 05/03/18 0514 05/03/18 0803 05/03/18 1126  GLUCAP 156* 60* 94 84 99   Lipid Profile: No results for input(s): CHOL, HDL, LDLCALC, TRIG, CHOLHDL, LDLDIRECT in the last 72 hours. Thyroid Function Tests: No results for input(s): TSH, T4TOTAL, FREET4, T3FREE, THYROIDAB in the last 72 hours. Anemia Panel: No results for input(s): VITAMINB12, FOLATE, FERRITIN, TIBC, IRON,  RETICCTPCT in the last 72 hours. Urine analysis:    Component Value Date/Time   COLORURINE YELLOW 04/26/2018 1448   APPEARANCEUR HAZY (A) 04/26/2018 1448   LABSPEC 1.015 04/26/2018 1448   PHURINE 5.0 04/26/2018 1448   GLUCOSEU NEGATIVE 04/26/2018 1448   HGBUR NEGATIVE 04/26/2018 1448   BILIRUBINUR NEGATIVE 04/26/2018 1448   KETONESUR 20 (A) 04/26/2018 1448   PROTEINUR NEGATIVE 04/26/2018 1448   NITRITE NEGATIVE  04/26/2018 1448   LEUKOCYTESUR NEGATIVE 04/26/2018 1448   Sepsis Labs: @LABRCNTIP (procalcitonin:4,lacticidven:4)  ) Recent Results (from the past 240 hour(s))  Urine culture     Status: Abnormal   Collection Time: 04/26/18  2:48 PM  Result Value Ref Range Status   Specimen Description   Final    URINE, CLEAN CATCH Performed at [facility], [address], [place], [place] [address]    Special Requests   Final    NONE Performed at [facility], [address], [place], [place] [address]    Culture (A)  Final    <10,000 COLONIES/mL INSIGNIFICANT GROWTH Performed at [facility], [address] [place], [place] [address]    Report Status 04/27/2018 FINAL  Final  Culture, blood (routine x 2)     Status: None   Collection Time: 04/26/18  3:08 PM  Result Value Ref Range Status   Specimen Description BLOOD RIGHT ARM  Final   Special Requests   Final    BOTTLES DRAWN AEROBIC AND ANAEROBIC Blood Culture adequate volume   Culture   Final    NO GROWTH 5 DAYS Performed at [facility], [address] [place], [place] [address]    Report Status 05/01/2018 FINAL  Final  Culture, blood (routine x 2)     Status: None   Collection Time: 04/26/18  3:16 PM  Result Value Ref Range Status   Specimen Description BLOOD LEFT ARM DRAWN BY RN  Final   Special Requests   Final    BOTTLES DRAWN AEROBIC AND ANAEROBIC Blood Culture adequate volume   Culture   Final    NO GROWTH 5 DAYS Performed at [facility], [address] [place], [place] [address]    Report Status  05/01/2018 FINAL  Final  MRSA PCR Screening     Status: None   Collection Time: 04/27/18  2:13 PM  Result Value Ref Range Status   MRSA by PCR NEGATIVE NEGATIVE Final    Comment:        The GeneXpert MRSA Assay (FDA approved for NASAL specimens only), is one component of a comprehensive MRSA colonization surveillance program. It is not intended to diagnose MRSA infection nor to guide or monitor treatment for MRSA infections. Performed at [facility], [address] [place], [place] [address]   Aerobic/Anaerobic Culture (surgical/deep wound)     Status: None   Collection Time: 04/27/18  8:30 PM  Result Value Ref Range Status   Specimen Description ABSCESS  Final   Special Requests INTRA ABDOMINAL DRAINAGE  Final   Gram Stain   Final    MODERATE WBC PRESENT, PREDOMINANTLY PMN FEW GRAM NEGATIVE RODS    Culture   Final    MODERATE SERRATIA MARCESCENS FEW KLEBSIELLA PNEUMONIAE NO ANAEROBES ISOLATED Performed at [facility], [address], [place], [place] [address]    Report Status 05/02/2018 FINAL  Final   Organism ID, Bacteria SERRATIA MARCESCENS  Final   Organism ID, Bacteria KLEBSIELLA PNEUMONIAE  Final      Susceptibility   Klebsiella pneumoniae - MIC*    AMPICILLIN RESISTANT Resistant     CEFAZOLIN <=4 SENSITIVE Sensitive     CEFEPIME <=1 SENSITIVE Sensitive     CEFTAZIDIME <=1 SENSITIVE Sensitive     CEFTRIAXONE <=1 SENSITIVE Sensitive     CIPROFLOXACIN <=0.25 SENSITIVE Sensitive     GENTAMICIN <=1 SENSITIVE Sensitive     IMIPENEM <=0.25 SENSITIVE Sensitive     TRIMETH/SULFA <=20 SENSITIVE  Sensitive     AMPICILLIN/SULBACTAM 4 SENSITIVE Sensitive     PIP/TAZO <=4 SENSITIVE Sensitive     Extended ESBL NEGATIVE Sensitive     * FEW KLEBSIELLA PNEUMONIAE   Serratia marcescens - MIC*    CEFAZOLIN >=64 RESISTANT Resistant     CEFEPIME <=1 SENSITIVE Sensitive     CEFTAZIDIME <=1 SENSITIVE Sensitive     CEFTRIAXONE <=1 SENSITIVE Sensitive      CIPROFLOXACIN <=0.25 SENSITIVE Sensitive     GENTAMICIN <=1 SENSITIVE Sensitive     TRIMETH/SULFA <=20 SENSITIVE Sensitive     * MODERATE SERRATIA MARCESCENS  Surgical pcr screen     Status: None   Collection Time: 04/29/18  9:11 PM  Result Value Ref Range Status   MRSA, PCR NEGATIVE NEGATIVE Final   Staphylococcus aureus NEGATIVE NEGATIVE Final    Comment: (NOTE) The Xpert SA Assay (FDA approved for NASAL specimens in patients 25 years of age and older), is one component of a comprehensive surveillance program. It is not intended to diagnose infection nor to guide or monitor treatment. Performed at [facility], [address], [place], [place] [address]   Culture, respiratory (non-expectorated)     Status: None (Preliminary result)   Collection Time: 05/02/18 11:04 AM  Result Value Ref Range Status   Specimen Description TRACHEAL ASPIRATE  Final   Special Requests NONE  Final   Gram Stain   Final    ABUNDANT WBC PRESENT, PREDOMINANTLY PMN FEW YEAST    Culture   Final    CULTURE REINCUBATED FOR BETTER GROWTH Performed at [facility], [address], [place], [place] [address]    Report Status PENDING  Incomplete         Radiology Studies: Dg Abd 1 View  Result Date: 05/02/2018 CLINICAL DATA:  85 year old male NG tube placement. EXAM: ABDOMEN - 1 VIEW COMPARISON:  05/01/2018 and earlier. FINDINGS: Portable AP supine view at 2310 hours. NG tube now in place in the left upper quadrant terminating at the level of the gastric body. Stable right CBD stent and percutaneous pigtail catheter. Stable cholecystectomy clips. Visible bowel gas pattern remains within normal limits. IMPRESSION: 1. Enteric tube now terminates at the gastric body. 2. Right CBD stent and right upper quadrant drainage catheter. Electronically Signed   By: Odessa Fleming M.D.   On: 05/02/2018 23:33   Dg Chest Port 1 View  Result Date: 05/03/2018 CLINICAL DATA:  Respiratory distress. EXAM: PORTABLE  CHEST 1 VIEW COMPARISON:  Radiograph May 02, 2018. FINDINGS: Stable cardiomegaly. Endotracheal tube is not currently visualized and presumably has been removed. Nasogastric tube is noted entering stomach. Left internal jugular catheter is again noted with tip in expected position of the SVC. No pneumothorax is noted. Right lung is clear. Atherosclerosis of thoracic aorta is noted. Stable left basilar atelectasis or infiltrate is noted with associated pleural effusion. Bony thorax is unremarkable. IMPRESSION: Endotracheal tube appears to have been removed. Stable left basilar opacity is noted as described above. Electronically Signed   By: Lupita Raider, M.D.   On: 05/03/2018 07:19   Dg Abd Portable 1v  Result Date: 05/04/2018 CLINICAL DATA:  Follow-up ileus EXAM: PORTABLE ABDOMEN - 1 VIEW COMPARISON:  05/02/2018 FINDINGS: Right upper quadrant drain is again seen and stable. Pneumobilia is noted with a biliary stent in place. Gastric catheter is noted within the stomach. Scattered large and small bowel gas is noted. No obstructive changes are seen. No acute bony abnormality is noted.  IMPRESSION: Stable appearance of the abdomen compared with the previous day. Electronically Signed   By: Alcide Clever M.D.   On: 05/04/2018 07:07        Scheduled Meds: . chlorhexidine  15 mL Mouth Rinse BID  . Chlorhexidine Gluconate Cloth  6 each Topical Daily  . mouth rinse  15 mL Mouth Rinse q12n4p  . pantoprazole (PROTONIX) IV  40 mg Intravenous Q24H  . sodium chloride flush  10-40 mL Intracatheter Q12H  . sodium chloride flush  5 mL Intracatheter Q8H   Continuous Infusions: . sodium chloride 10 mL/hr at 05/03/18 1100  . cefTRIAXone (ROCEPHIN)  IV 2 g (05/03/18 1154)     LOS: 8 days    Time spent:    Zannie Cove, MD [facility]   05/04/2018, 9:44 AM

## 2018-05-04 NOTE — Progress Notes (Signed)
Pt NGT feeding Vital A.F 1.2  Formula initiated started at 38ml/hr as order by Santina Evans Dietitian will advance as tolerated to 25 ml/hr. No complaints or signs of discomfort noted. Will continue to monitor pt.

## 2018-05-04 NOTE — Progress Notes (Signed)
SLP Cancellation Note  Patient Details Name: Vincent Barrett MRN: [id number] DOB: 1925-12-22   Cancelled treatment:       Reason Eval/Treat Not Completed: Fatigue/lethargy limiting ability to participate. Also, per order set, NGT is still to be set to LIS. Reached out to surgical team for clarification. Await medical clearance and increased alertness before attempting PO trials.    Virl Axe Melvinia Ashby 05/04/2018, 9:41 AM  Ivar Drape, M.A. CCC-SLP Acute Herbalist [phone] Office [phone]

## 2018-05-04 NOTE — Progress Notes (Addendum)
RD working remotely.  Nutrition Consult/Follow Up  INTERVENTION:    Initiate Vital AF 1.2 formula at trickle rate of 25 ml/hr via NGT  Provides 720 kcals, 45 gm protein, 486 ml free water   RD to advance tomorrow, 3/25 per MD order  NUTRITION DIAGNOSIS:   Inadequate oral intake related to acute illness as evidenced by NPO status, ongoing  GOAL:   Patient will meet greater than or equal to 90% of their needs, progressing  MONITOR:   Diet advancement, TF tolerance, Labs, Skin, Weight trends, I & O's  ASSESSMENT:   85 yo male admitted with abdominal abscess/biloma, pneumonia, LLL lung mass. Pt with recent admission from 04/14/18 to 04/23/18 for acute calculus cholecystitis and choledocholithiasis and underwent ERCP with sphinerotomy/CBD stone removal. PMH includes COPD, HTN.  3/06 - lap cholecystectomy 3/12 - ERCP with sphinerotomy and CBD stone removal 3/13 - discharge 3/16 - admit 3/17 - RUQ biliary drain, intubation 3/18 - HIDA scan revealed bile leak at subhepatic space 3/20 - ERCP with biliary stent placement 3/21 - Vital High Protein at 20 ml/hr via OGT  Pt self extubated 3/22. TF formula discontinued via OGT with extubation. Kayexalate enema given 3/22 PM; leaked out pt's rectal tube.  MD ordered NGT placement to LIS; tip verified in stomach. Pt transferred from [facility] to [facility] 3/23. RD reconsulted to start trophic feeds.  Clarified nutrition care plan with Dr. Jomarie Longs. Plan is to turn off LIS via NGT and start feeds. Spoke with [place], Charity fundraiser.  Speech Path following for PO diet readiness.  Labs & medications reviewed. K 3.1 (L). CBG's [id number].  Diet Order:   Diet Order            Diet NPO time specified  Diet effective midnight             EDUCATION NEEDS:   Not appropriate for education at this time  Skin:  Skin Assessment: Skin Integrity Issues: Stage I: coccyx Incisions: abdomen  Last BM:  3/23   Intake/Output Summary (Last 24  hours) at 05/04/2018 1322 Last data filed at 05/04/2018 0901 Gross per 24 hour  Intake 5 ml  Output 2109 ml  Net -2104 ml   Height:   Ht Readings from Last 1 Encounters:  04/27/18 5\' 6"  (1.676 m)   Weight:   Wt Readings from Last 1 Encounters:  05/04/18 85.4 kg  Admit wt         79.5 kg  BMI:  Body mass index is 30.39 kg/m. >> highly skewed   Estimated Nutritional Needs:   Kcal:  1800-2000  Protein:  105-120 gm  Fluid:  1.8-2.0 L  Maureen Chatters, RD, LDN Pager #: [phone] After-Hours Pager #: [phone]

## 2018-05-04 NOTE — Progress Notes (Addendum)
Wife called and request that physician calls her in the am with morning rounds, she expressed concern for husband and the current no visitor policy.  Informed her that message will be passed to day shift nurse.  Hopitalist notified about patients genital edema, new orders received for Lasix.  Will follow up.

## 2018-05-05 ENCOUNTER — Inpatient Hospital Stay (HOSPITAL_COMMUNITY): Payer: Medicare Other

## 2018-05-05 DIAGNOSIS — Z7189 Other specified counseling: Secondary | ICD-10-CM

## 2018-05-05 DIAGNOSIS — G9341 Metabolic encephalopathy: Secondary | ICD-10-CM

## 2018-05-05 DIAGNOSIS — K839 Disease of biliary tract, unspecified: Secondary | ICD-10-CM

## 2018-05-05 DIAGNOSIS — K651 Peritoneal abscess: Secondary | ICD-10-CM

## 2018-05-05 DIAGNOSIS — T8143XA Infection following a procedure, organ and space surgical site, initial encounter: Secondary | ICD-10-CM

## 2018-05-05 DIAGNOSIS — Z515 Encounter for palliative care: Secondary | ICD-10-CM

## 2018-05-05 DIAGNOSIS — T8149XA Infection following a procedure, other surgical site, initial encounter: Secondary | ICD-10-CM

## 2018-05-05 LAB — CBC
HCT: 32.2 % — ABNORMAL LOW (ref 39.0–52.0)
Hemoglobin: 10.2 g/dL — ABNORMAL LOW (ref 13.0–17.0)
MCH: 29.8 pg (ref 26.0–34.0)
MCHC: 31.7 g/dL (ref 30.0–36.0)
MCV: 94.2 fL (ref 80.0–100.0)
NRBC: 0 % (ref 0.0–0.2)
Platelets: 333 10*3/uL (ref 150–400)
RBC: 3.42 MIL/uL — ABNORMAL LOW (ref 4.22–5.81)
RDW: 13.4 % (ref 11.5–15.5)
WBC: 14.2 10*3/uL — AB (ref 4.0–10.5)

## 2018-05-05 LAB — COMPREHENSIVE METABOLIC PANEL
ALT: 17 U/L (ref 0–44)
AST: 24 U/L (ref 15–41)
Albumin: 2 g/dL — ABNORMAL LOW (ref 3.5–5.0)
Alkaline Phosphatase: 91 U/L (ref 38–126)
Anion gap: 10 (ref 5–15)
BUN: 43 mg/dL — ABNORMAL HIGH (ref 8–23)
CO2: 30 mmol/L (ref 22–32)
Calcium: 8 mg/dL — ABNORMAL LOW (ref 8.9–10.3)
Chloride: 106 mmol/L (ref 98–111)
Creatinine, Ser: 2.98 mg/dL — ABNORMAL HIGH (ref 0.61–1.24)
GFR calc Af Amer: 20 mL/min — ABNORMAL LOW (ref >60)
GFR calc non Af Amer: 18 mL/min — ABNORMAL LOW (ref >60)
Glucose, Bld: 141 mg/dL — ABNORMAL HIGH (ref 70–99)
Potassium: 3.3 mmol/L — ABNORMAL LOW (ref 3.5–5.1)
Sodium: 146 mmol/L — ABNORMAL HIGH (ref 135–145)
Total Bilirubin: 0.6 mg/dL (ref 0.3–1.2)
Total Protein: 6 g/dL — ABNORMAL LOW (ref 6.5–8.1)

## 2018-05-05 MED ORDER — FUROSEMIDE 10 MG/ML IJ SOLN
40.0000 mg | Freq: Once | INTRAMUSCULAR | Status: AC
  Administered 2018-05-05: 40 mg via INTRAVENOUS
  Filled 2018-05-05: qty 4

## 2018-05-05 MED ORDER — FUROSEMIDE 10 MG/ML IJ SOLN
40.0000 mg | Freq: Two times a day (BID) | INTRAMUSCULAR | Status: AC
  Administered 2018-05-05 – 2018-05-06 (×2): 40 mg via INTRAVENOUS
  Filled 2018-05-05 (×2): qty 4

## 2018-05-05 MED ORDER — FREE WATER
100.0000 mL | Freq: Three times a day (TID) | Status: DC
  Administered 2018-05-05 – 2018-05-06 (×4): 100 mL

## 2018-05-05 MED ORDER — VITAL AF 1.2 CAL PO LIQD
1000.0000 mL | ORAL | Status: DC
  Administered 2018-05-05 (×2): 1000 mL
  Filled 2018-05-05 (×2): qty 1000

## 2018-05-05 NOTE — Progress Notes (Signed)
Patient ID: Vincent Barrett, male   DOB: 1925-12-19, 85 y.o.   MRN: [id number]   IR note via phone per new regulations  RUQ; subhepatic abscess drain placed 3/17 OP 60 cc yesterday; 40 so far today per RN Bilious Flushes easily Site is C/D/I per RN  Afeb; wbc 14.2  05/02/2018 FINAL    Organism ID, Bacteria SERRATIA MARCESCENS   Organism ID, Bacteria KLEBSIELLA PNEUMONIAE     Will follow

## 2018-05-05 NOTE — Progress Notes (Signed)
SLP Cancellation Note  Patient Details Name: Vincent Barrett MRN: [id number] DOB: 01-Feb-1926   Cancelled treatment:       Reason Eval/Treat Not Completed: Fatigue/lethargy limiting ability to participate.  Per RN, pt minimally arouseable this morning. Not appropriate for swallow evaluation yet. Will continue to follow.   Virl Axe Lenyx Boody 05/05/2018, 10:09 AM  Ivar Drape, M.A. CCC-SLP Acute Herbalist [phone] Office [phone]

## 2018-05-05 NOTE — TOC Progression Note (Signed)
Transition of Care [facility]) - Progression Note    Patient Details  Name: Vincent Barrett MRN: [id number] Date of Birth: [birth year]  Transition of Care [facility]) CM/SW Contact  Epifanio Lesches, RN Phone Number: 05/05/2018, 2:57 PM  Clinical Narrative:    NCM received call from palliative liaison. Liaison shared wife is interested in home hospice care with [facility]. NCM shared information with MD and order obtained for home hospice care after confirming with wife Vincent Barrett. Referral made with [facility]/ [facility] for home hospice care. NCM indicated pt will need hospital bed , oxygen setup in home prior to d./c. PTAR will be needed for transportation to home.     Expected Discharge Plan: Skilled Nursing Facility Barriers to Discharge: (Complications associate with recent bowel surgery)  Expected Discharge Plan and Services  Expected discharge plan: home with hospice care   Discharge Planning Services: CM Consult   Living arrangements for the past 2 months: Single Family Home                           Social Determinants of Health (SDOH) Interventions    Readmission Risk Interventions No flowsheet data found.

## 2018-05-05 NOTE — Progress Notes (Addendum)
5 Days Post-Op  Subjective: CC: Abdominal pain Patient more conversant today. Opens eyes and answers one step questions. When asked if in any pain, points to his suprapubic area of abdomen. Denies N/V. Unsure of flatus. Started on trickle TF's yesterday. Asking when he can go home.   Objective: Vital signs in last 24 hours: Temp:  [97.7 F (36.5 C)-98.9 F (37.2 C)] 98.9 F (37.2 C) (03/25 0830) Pulse Rate:  [107-108] 108 (03/24 1606) Resp:  [14-15] 14 (03/24 1606) BP: (135-153)/(83-90) 135/83 (03/24 1606) SpO2:  [97 %-98 %] 97 % (03/24 1606) Last BM Date: 05/03/18  Intake/Output from previous day: 03/24 0701 - 03/25 0700 In: 631.3 [I.V.:45; NG/GT:586.3] Out: 4389 [Urine:4300; Emesis/NG output:30; Drains:59] Intake/Output this shift: Total I/O In: 10 [I.V.:5; Other:5] Out: 1040 [Urine:1000; Drains:40]  PE: Gen: NAD Heart: Tachycardic, regular rhythm  Lungs: Normal effort, rhonchi noted  Abd: Soft, mild distension, tenderness of the suprapubic and right side of the abdomen. +BS. Drain in place with bilious output. 60cc overnight. 40 this Am. Getting TF's.  GU: Scrotal edema  Lab Results:  Recent Labs    05/04/18 0359 05/05/18 0437  WBC 11.6* 14.2*  HGB 9.6* 10.2*  HCT 29.1* 32.2*  PLT 323 333   BMET Recent Labs    05/04/18 0359 05/05/18 0437  NA 145 146*  K 3.1* 3.3*  CL 108 106  CO2 27 30  GLUCOSE 134* 141*  BUN 45* 43*  CREATININE 2.89* 2.98*  CALCIUM 7.8* 8.0*   PT/INR No results for input(s): LABPROT, INR in the last 72 hours. CMP     Component Value Date/Time   NA 146 (H) 05/05/2018 0437   NA 138 03/17/2018 1554   K 3.3 (L) 05/05/2018 0437   CL 106 05/05/2018 0437   CO2 30 05/05/2018 0437   GLUCOSE 141 (H) 05/05/2018 0437   BUN 43 (H) 05/05/2018 0437   BUN 20 03/17/2018 1554   CREATININE 2.98 (H) 05/05/2018 0437   CALCIUM 8.0 (L) 05/05/2018 0437   PROT 6.0 (L) 05/05/2018 0437   ALBUMIN 2.0 (L) 05/05/2018 0437   AST 24 05/05/2018 0437    ALT 17 05/05/2018 0437   ALKPHOS 91 05/05/2018 0437   BILITOT 0.6 05/05/2018 0437   GFRNONAA 18 (L) 05/05/2018 0437   GFRAA 20 (L) 05/05/2018 0437   Lipase     Component Value Date/Time   LIPASE 35 04/26/2018 1508       Studies/Results: Dg Abd Portable 1v  Result Date: 05/04/2018 CLINICAL DATA:  Follow-up ileus EXAM: PORTABLE ABDOMEN - 1 VIEW COMPARISON:  05/02/2018 FINDINGS: Right upper quadrant drain is again seen and stable. Pneumobilia is noted with a biliary stent in place. Gastric catheter is noted within the stomach. Scattered large and small bowel gas is noted. No obstructive changes are seen. No acute bony abnormality is noted. IMPRESSION: Stable appearance of the abdomen compared with the previous day. Electronically Signed   By: Alcide Clever M.D.   On: 05/04/2018 07:07    Anti-infectives: Anti-infectives (From admission, onward)   Start     Dose/Rate Route Frequency Ordered Stop   05/02/18 1200  cefTRIAXone (ROCEPHIN) 2 g in sodium chloride 0.9 % 100 mL IVPB     2 g 200 mL/hr over 30 Minutes Intravenous Every 24 hours 05/02/18 1100     04/27/18 1800  vancomycin (VANCOCIN) IVPB 750 mg/150 ml premix  Status:  Discontinued     750 mg 150 mL/hr over 60 Minutes Intravenous Every 24  hours 04/26/18 1634 04/27/18 0730   04/27/18 1600  ceFEPIme (MAXIPIME) 2 g in sodium chloride 0.9 % 100 mL IVPB  Status:  Discontinued     2 g 200 mL/hr over 30 Minutes Intravenous Every 24 hours 04/26/18 1629 04/27/18 1412   04/27/18 1600  ceFEPIme (MAXIPIME) 1 g in sodium chloride 0.9 % 100 mL IVPB  Status:  Discontinued     1 g 200 mL/hr over 30 Minutes Intravenous Every 24 hours 04/27/18 1412 05/02/18 1100   04/26/18 2359  metroNIDAZOLE (FLAGYL) IVPB 500 mg  Status:  Discontinued     500 mg 100 mL/hr over 60 Minutes Intravenous Every 8 hours 04/26/18 2325 05/02/18 1058   04/26/18 1700  vancomycin (VANCOCIN) 1,500 mg in sodium chloride 0.9 % 500 mL IVPB     1,500 mg 250 mL/hr over 120  Minutes Intravenous  Once 04/26/18 1557 04/26/18 2059   04/26/18 1600  ceFEPIme (MAXIPIME) 2 g in sodium chloride 0.9 % 100 mL IVPB     2 g 200 mL/hr over 30 Minutes Intravenous  Once 04/26/18 1553 04/26/18 1650   04/26/18 1600  metroNIDAZOLE (FLAGYL) IVPB 500 mg     500 mg 100 mL/hr over 60 Minutes Intravenous  Once 04/26/18 1553 04/26/18 1830   04/26/18 1600  vancomycin (VANCOCIN) IVPB 1000 mg/200 mL premix  Status:  Discontinued     1,000 mg 200 mL/hr over 60 Minutes Intravenous  Once 04/26/18 1553 04/26/18 1557       Assessment/Plan Acute respiratory failure 2/2 aspiration PNA AKI Paroxysmal A. Fib Anemia- Hgb stable and rising, 10.2 on 3/25 Scrotal edema. UA c/w with UTI - on rocephin already. Per medicine   Status post laparoscopic cholecystectomy 04/16/2018 by Dr. Corliss Skains Gallbladder fossa abscess - S/p ERCP with CBD stent on 3/20 - Cx with serratia and klebsiella  - Continue IV abx  - Continue drain - IV ABX,drain inserted with bilious output. ERCP/stent 3/20; growing serratia and klebsiella   FEN- Trickle TFs, increase as tolerated, speech to see patient for swallow study.  ID - Vancomycin 3/16 >> 3/16, Cefepime 3/16 >> 3/21, Flagyl 3/16 >> 3/21. Rocephin 3/22 >>WBC 14.2, afebrile VTE - SCDs, okay for chemical prophylaxis from a surgical standpoint   LOS: 9 days    Jacinto Halim , [facility] Surgery 05/05/2018, 9:44 AM Pager: [phone]

## 2018-05-05 NOTE — Consult Note (Signed)
Consultation Note Date: 05/05/2018   Patient Name: Vincent Barrett  DOB: [birth year]  MRN: [id number]  Age / Sex: 85 y.o., male  PCP: [name], MD Referring Physician: Glade Lloyd, MD  Reason for Consultation: Establishing goals of care  HPI/Patient Profile: 85 y.o. male  with past medical history of HTN, a fib on eliquis, CKD, BPH, and COPD admitted on 04/26/2018 with increased abdominal pain and bloating.  Patient has a recent admission 3/4-3/13 for acute cholecystitis and a cholecystectomy. Patient also had an ERCP w/ sphincterectomy and extraction of stone during this admission. This admission, patient found to have gallbladder fossa abscess. He required transfer to ICU and was intubated. Found to have biliary leak and required pressors d/t septic shock. Patient required IR drain placement on 3/17 for abscess. He had ERCP with CBD stent placement 3/20. He self-extubated on 3/22. Today, patient is less responsive. Unable to participate in SLP evaluation and remains on tube feeds. PMT consulted for GOC.   Clinical Assessment and Goals of Care: I have reviewed medical records including EPIC notes, labs and imaging, received report from RN, assessed the patient and then spoke with patient's wife, Vincent Barrett,  to discuss diagnosis prognosis, GOC, EOL wishes, disposition and options.  I introduced Palliative Medicine as specialized medical care for people living with serious illness. It focuses on providing relief from the symptoms and stress of a serious illness. The goal is to improve quality of life for both the patient and the family.  As far as functional and nutritional status, she tells me of a decline in patient - weakness and falls. Appetite okay. She tells me lately he has had an "I don't care" attitude.    We discussed his current illness and what it means in the larger context of his on-going co-morbidities.  Natural disease trajectory and  expectations at EOL were discussed. Discussed patient's prolonged hospitalization, lethargy, inability to maintain PO intake.   I attempted to elicit values and goals of care important to the patient.  Vincent Barrett shares that patient would never want to be in a facility - we discussed that continued aggressive care would likely lead to rehab placement if patient improved some.   The difference between aggressive medical intervention and comfort care was considered in light of the patient's goals of care. She tells me all the patient wants it to come home. Vincent Barrett tells me she is at a point where she just want to take the patient home with hospice and focus on his comfort. She tells me she and her daughter are coming to the hospital this evening to verify this decision. I explained that I could go ahead and start working on a hospice plan and she agrees to that. Will not make him full comfort care yet - will wait until after Vincent Barrett sees this evening.   We discussed that patient's code status allows for reintubation and this would not be in line with goals of care - Vincent Barrett agrees and agreed to change code status to DNR.   Hospice and Palliative Care services outpatient were explained and offered. Vincent Barrett requests home hospice with [facility].  Questions and concerns were addressed. The family was encouraged to call with questions or concerns.   Discussed with nurse that patient is nearing end of life - patient's wife and daughter will be allowed to visit this evening to verify final decision about comfort care.   PMT will see tomorrow and continue discussion with wife.   Primary  Decision Maker NEXT OF KIN - wife, Vincent Barrett   SUMMARY OF RECOMMENDATIONS   Continue current care for now, wife and daughter coming to see patient tonight before verifying decision to transition to full comfort care Wife agrees to setting up hospice at home - requests [facility] - I have discussed this with case  manager Code status changed to DNR PMT to follow up tomorrow Biggest goal is NO facility placement  Code Status/Advance Care Planning:  DNR  Symptom Management:   Currently comfortable - will readdress tomorrow  Palliative Prophylaxis:   Aspiration, Bowel Regimen, Frequent Pain Assessment and Turn Reposition  Additional Recommendations (Limitations, Scope, Preferences):  Avoid Hospitalization and No Tracheostomy  Psycho-social/Spiritual:   Desire for further Chaplaincy support:no  Additional Recommendations: Education on Hospice  Prognosis:   Unable to determine - likely weeks, possibly shorter depending on mental status, PO intake  Discharge Planning: Home with Hospice      Primary Diagnoses: Present on Admission: . Sepsis (HCC)   I have reviewed the medical record, interviewed the patient and family, and examined the patient. The following aspects are pertinent.  Past Medical History:  Diagnosis Date  . BPH (benign prostatic hyperplasia)   . COPD (chronic obstructive pulmonary disease) (HCC)   . Hypertension   . OA (osteoarthritis)   . PAF (paroxysmal atrial fibrillation) (HCC)    Social History   Socioeconomic History  . Marital status: Married    Spouse name: Not on file  . Number of children: 4  . Years of education: Not on file  . Highest education level: Not on file  Occupational History  . Not on file  Social Needs  . Financial resource strain: Not on file  . Food insecurity:    Worry: Not on file    Inability: Not on file  . Transportation needs:    Medical: Not on file    Non-medical: Not on file  Tobacco Use  . Smoking status: Former Smoker    Packs/day: 2.00    Years: 11.00    Pack years: 22.00    Types: Cigarettes    Last attempt to quit: 02/10/1957    Years since quitting: 61.2  . Smokeless tobacco: Never Used  Substance and Sexual Activity  . Alcohol use: No  . Drug use: No  . Sexual activity: Not on file  Lifestyle  .  Physical activity:    Days per week: Not on file    Minutes per session: Not on file  . Stress: Not on file  Relationships  . Social connections:    Talks on phone: Not on file    Gets together: Not on file    Attends religious service: Not on file    Active member of club or organization: Not on file    Attends meetings of clubs or organizations: Not on file    Relationship status: Not on file  Other Topics Concern  . Not on file  Social History Narrative   [place] [employer] veteran - [employer]   Family History  Problem Relation Age of Onset  . Heart disease Neg Hx    Scheduled Meds: . chlorhexidine  15 mL Mouth Rinse BID  . Chlorhexidine Gluconate Cloth  6 each Topical Daily  . free water  100 mL Per Tube Q8H  . furosemide  40 mg Intravenous BID  . mouth rinse  15 mL Mouth Rinse q12n4p  . pantoprazole (PROTONIX) IV  40 mg Intravenous Q24H  . sodium chloride  flush  10-40 mL Intracatheter Q12H  . sodium chloride flush  5 mL Intracatheter Q8H   Continuous Infusions: . sodium chloride 10 mL/hr at 05/03/18 1100  . cefTRIAXone (ROCEPHIN)  IV 2 g (05/05/18 1332)  . feeding supplement (VITAL AF 1.2 CAL) 1,000 mL (05/05/18 1349)   PRN Meds:.sodium chloride, bisacodyl, docusate, ipratropium-albuterol, [DISCONTINUED] ondansetron **OR** ondansetron (ZOFRAN) IV, sodium chloride flush Allergies  Allergen Reactions  . Quinine Nausea And Vomiting   Review of Systems  Unable to perform ROS: Mental status change    Physical Exam Constitutional:      General: He is not in acute distress.    Appearance: He is ill-appearing.  Cardiovascular:     Rate and Rhythm: Normal rate. Rhythm irregular.  Pulmonary:     Effort: Pulmonary effort is normal.     Breath sounds: Normal breath sounds.  Abdominal:     Palpations: Abdomen is soft.     Comments: abd drain in place  Musculoskeletal:     Right lower leg: Edema present.     Left lower leg: Edema present.  Skin:    General: Skin is warm and  dry.  Neurological:     Mental Status: He is lethargic.     Comments: Minimally responsive     Vital Signs: BP (!) 154/80   Pulse 93   Temp 98.7 F (37.1 C) (Axillary)   Resp (!) 26   Ht 5\' 6"  (1.676 m)   Wt 85.4 kg   SpO2 95%   BMI 30.39 kg/m  Pain Scale: Faces POSS *See Group Information*: 1-Acceptable,Awake and alert Pain Score: 0-No pain   SpO2: SpO2: 95 % O2 Device:SpO2: 95 % O2 Flow Rate: .O2 Flow Rate (L/min): 2 L/min  IO: Intake/output summary:   Intake/Output Summary (Last 24 hours) at 05/05/2018 1504 Last data filed at 05/05/2018 1300 Gross per 24 hour  Intake 636.25 ml  Output 4190 ml  Net -3553.75 ml    LBM: Last BM Date: 05/03/18 Baseline Weight: Weight: 79.5 kg Most recent weight: Weight: 85.4 kg     Palliative Assessment/Data: PPS 30%    Time Total: 70 minutes Greater than 50%  of this time was spent counseling and coordinating care related to the above assessment and plan.  Gerlean Ren, DNP, AGNP-C Palliative Medicine Team [phone] Pager: [phone]

## 2018-05-05 NOTE — Progress Notes (Addendum)
Nutrition Follow Up/Brief Note  RD consulted 3/24 for TF initiation & management.  Vital AF 1.2 currently infusing at trickle rate of 25 ml/hr via NGT.  Spoke with Leotis Shames, RN. Pt tolerating well at this time.   Will advance Vital AF 1.2 by 10 ml every 12 hrs to goal rate of 65 ml/hr. TF regimen to provide 1872 kcals, 117 gm protein, 1265 ml free water daily. RD to follow for nutrition care plan.  Maureen Chatters, RD, LDN Pager #: [phone] After-Hours Pager #: [phone]

## 2018-05-05 NOTE — Progress Notes (Signed)
Patient ID: Vincent Barrett, male   DOB: December 12, 1925, 85 y.o.   MRN: [id number]  PROGRESS NOTE    Ankit Kopecky  BTD:[id number] DOB: 09/28/1925 DOA: 04/26/2018 PCP: [name], MD   Brief Narrative:  85 year old male with history of atrial fibrillation, hypertension, COPD, CKD stage III, recent admission for acute  cholecystitis status post laparoscopic cholecystectomy followed by ERCP with sphincterotomy and balloon extraction of stone on 04/22/2018 was admitted on 04/26/2018 with increased abdominal pain and was found to have gallbladder fossa abscess.  He was started on antibiotics.  He had to be transferred to ICU and subsequently was intubated.  She required pressors.  He was found to have biliary leak.  She underwent IR guided drain placement on 04/27/2018 for the subhepatic abscess.  Biliary culture grew Serratia marcescens and Klebsiella pneumoniae.  He subsequently underwent ERCP with CBD stent placement on 04/30/2018.  He self extubated on 05/02/2018.  He was transferred from [facility] to [facility] on 05/04/2018  Assessment & Plan:   Active Problems:   Sepsis (HCC)   Pressure injury of skin  Septic shock from gallbladder fossa abscess -Shock has resolved.  Currently hemodynamically stable.  Patient has been transferred out of ICU.  He was on pressors in ICU.  Antibiotic plan as below.  Gallbladder fossa abscess/bile leak status post CBD stent in a patient with recent cholecystectomy followed by ERCP with sphincterotomy and balloon extraction of stone -Status post IR guided subhepatic drain placement on 04/27/2018. -Had subsequent ERCP with CBD stent placement on 04/30/2018.  GI has signed off.  Outpatient follow-up with GI -Biliary cultures grew Serratia marcescens and Klebsiella pneumonia. -Currently on Rocephin, today is day #10 of antibiotics.  Will finish 2 weeks of antibiotic treatment -General surgery following. -Currently has tube feed and will try and advance tube feeding today.  Patient is lethargic  and is unable to tolerate orally.  Once patient is more awake, patient will need SLP evaluation.  Acute hypoxic respiratory failure/aspiration pneumonia -Intubated; and self extubated on 05/02/2018 -Currently on 2 L oxygen via nasal cannula -We will get portable chest x-ray  Acute metabolic encephalopathy/altered mental status/delirium -Most likely secondary to combination of above.  Patient is lethargic and hardly answers any questions.  Will get CT of the head. -Fall precautions  Leukocytosis -Worsening.  Will check blood cultures.  Continue Rocephin  Acute kidney injury on chronic kidney disease stage III -Baseline creatinine is around 1.5.  Creatinine peaked to 3.28.  Today is 2.98.  Monitor  Hypernatremia -Probably from dehydration from poor oral intake.  We will add free water with tube feeding.  Monitor sodium.  Generalized deconditioning -Overall prognosis is guarded to poor.  Will get palliative care consultation.  Paroxysmal A. Fib -Eliquis on hold.  Hypertension -Outpatient Norvasc, Cardura, Hyzaar, Toprol on hold  Anemia of critical illness -Hemoglobin stable.  Monitor  Fluid overload -Patient is grossly fluid overloaded.  We will continue Lasix 40 mg IV every 12 hours.  Monitor strict input and output and daily weights.  We will get 2D echo.   DVT prophylaxis: SCDs Code Status: Partial code: Wants mechanical ventilation but declined CPR/defibrillation Family Communication: None at bedside Disposition Plan: Depends on clinical outcome  Consultants: PCCM/GI/general surgery/IR  Procedures: Intubation; chest extubation on 05/02/2018 IR guided subhepatic drain placement on 04/27/2018 ERCP and CBD stent placement on 04/30/2018  Antimicrobials:  Anti-infectives (From admission, onward)   Start     Dose/Rate Route Frequency Ordered Stop   05/02/18 1200  cefTRIAXone (  ROCEPHIN) 2 g in sodium chloride 0.9 % 100 mL IVPB     2 g 200 mL/hr over 30 Minutes Intravenous  Every 24 hours 05/02/18 1100     04/27/18 1800  vancomycin (VANCOCIN) IVPB 750 mg/150 ml premix  Status:  Discontinued     750 mg 150 mL/hr over 60 Minutes Intravenous Every 24 hours 04/26/18 1634 04/27/18 0730   04/27/18 1600  ceFEPIme (MAXIPIME) 2 g in sodium chloride 0.9 % 100 mL IVPB  Status:  Discontinued     2 g 200 mL/hr over 30 Minutes Intravenous Every 24 hours 04/26/18 1629 04/27/18 1412   04/27/18 1600  ceFEPIme (MAXIPIME) 1 g in sodium chloride 0.9 % 100 mL IVPB  Status:  Discontinued     1 g 200 mL/hr over 30 Minutes Intravenous Every 24 hours 04/27/18 1412 05/02/18 1100   04/26/18 2359  metroNIDAZOLE (FLAGYL) IVPB 500 mg  Status:  Discontinued     500 mg 100 mL/hr over 60 Minutes Intravenous Every 8 hours 04/26/18 2325 05/02/18 1058   04/26/18 1700  vancomycin (VANCOCIN) 1,500 mg in sodium chloride 0.9 % 500 mL IVPB     1,500 mg 250 mL/hr over 120 Minutes Intravenous  Once 04/26/18 1557 04/26/18 2059   04/26/18 1600  ceFEPIme (MAXIPIME) 2 g in sodium chloride 0.9 % 100 mL IVPB     2 g 200 mL/hr over 30 Minutes Intravenous  Once 04/26/18 1553 04/26/18 1650   04/26/18 1600  metroNIDAZOLE (FLAGYL) IVPB 500 mg     500 mg 100 mL/hr over 60 Minutes Intravenous  Once 04/26/18 1553 04/26/18 1830   04/26/18 1600  vancomycin (VANCOCIN) IVPB 1000 mg/200 mL premix  Status:  Discontinued     1,000 mg 200 mL/hr over 60 Minutes Intravenous  Once 04/26/18 1553 04/26/18 1557       Subjective: Patient seen and examined at bedside.  He wakes up slightly on calling his name, hardly answers any questions.  Very slow to respond or to follow commands.  No overnight fever or vomiting reported.  Objective: Vitals:   05/04/18 1133 05/04/18 1606 05/04/18 2115 05/05/18 0830  BP: (!) 153/90 135/83    Pulse: (!) 107 (!) 108    Resp: 15 14    Temp: 97.7 F (36.5 C) 98.5 F (36.9 C) 98.5 F (36.9 C) 98.9 F (37.2 C)  TempSrc: Axillary Axillary Oral Axillary  SpO2: 98% 97%    Weight:       Height:        Intake/Output Summary (Last 24 hours) at 05/05/2018 1115 Last data filed at 05/05/2018 0925 Gross per 24 hour  Intake 636.25 ml  Output 5370 ml  Net -4733.75 ml   Filed Weights   05/03/18 0233 05/04/18 0409 05/04/18 0927  Weight: 86 kg 80.5 kg 85.4 kg    Examination:  General exam: Elderly male lying in bed.  Drowsy, does not answer questions. Respiratory system: Bilateral decreased breath sounds at bases with scattered crackles Cardiovascular system: S1 & S2 heard, Rate controlled Gastrointestinal system: Abdomen is nondistended, soft and mildly tender around the dressing..  Right upper quadrant dressing with drain present normal bowel sounds heard. Extremities: No cyanosis, clubbing; 2+ lower extremity edema Central nervous system: Very drowsy, hardly answers questions. No focal neurological deficits. Moving extremities Skin: No rashes, lesions or ulcers Psychiatry: Could not be assessed because of mental status    Data Reviewed: I have personally reviewed following labs and imaging studies  CBC: Recent Labs  Lab 04/29/18 0501 04/30/18 0417 05/01/18 0350 05/01/18 1254 05/02/18 0347 05/04/18 0359 05/05/18 0437  WBC 15.4* 14.1* 11.9*  --  10.4 11.6* 14.2*  NEUTROABS 13.3* 11.7* 9.2*  --  7.5  --   --   HGB 7.5* 7.3* 6.8* 8.6* 8.9* 9.6* 10.2*  HCT 24.8* 23.8* 22.1* 26.9* 27.1* 29.1* 32.2*  MCV 96.9 97.9 98.7  --  95.1 93.3 94.2  PLT 311 302 284  --  304 323 333   Basic Metabolic Panel: Recent Labs  Lab 04/29/18 0501  04/30/18 0417 04/30/18 1932 05/01/18 0350 05/02/18 0347  05/02/18 1910 05/03/18 0235 05/03/18 1002 05/04/18 0359 05/05/18 0437  NA  --    < >  --  144  --  143   < > 134* 145 147* 145 146*  K  --    < >  --  4.5  --  3.8   < > >7.5* 3.4* 3.3* 3.1* 3.3*  CL  --    < >  --  120*  --  116*   < > 108 113* 111 108 106  CO2  --    < >  --  18*  --  21*   < > 20* GLUCOSE  --    < >  --  146*  --  116*   < > 116* 97 96  134* 141*  BUN  --    < >  --  51*  --  49*   < > 48* 49* 48* 45* 43*  CREATININE  --    < > 3.02* 2.89*  --  2.89*   < > 3.28* 3.09* 3.10* 2.89* 2.98*  CALCIUM  --    < >  --  7.4*  --  7.5*   < > 7.0* 8.1* 8.2* 7.8* 8.0*  MG 2.2  --  2.1 2.0 2.1 1.8  --   --   --   --   --   --   PHOS 2.5  --  2.3*  --  2.6 3.0  --   --   --   --   --   --    < > = values in this interval not displayed.   GFR: Estimated Creatinine Clearance: 16.9 mL/min (A) (by C-G formula based on SCr of 2.98 mg/dL (H)). Liver Function Tests: Recent Labs  Lab 04/29/18 0501 05/01/18 0350 05/05/18 0437  AST 89* 36 24  ALT 45* 28 17  ALKPHOS 84 72 91  BILITOT 1.4* 0.8 0.6  PROT 5.1* 5.0* 6.0*  ALBUMIN 1.8* 1.8* 2.0*   No results for input(s): LIPASE, AMYLASE in the last 168 hours. No results for input(s): AMMONIA in the last 168 hours. Coagulation Profile: Recent Labs  Lab 04/29/18 0501 04/30/18 0417  INR 1.7* 1.7*   Cardiac Enzymes: No results for input(s): CKTOTAL, CKMB, CKMBINDEX, TROPONINI in the last 168 hours. BNP (last 3 results) No results for input(s): PROBNP in the last 8760 hours. HbA1C: No results for input(s): HGBA1C in the last 72 hours. CBG: Recent Labs  Lab 05/03/18 0057 05/03/18 0439 05/03/18 0514 05/03/18 0803 05/03/18 1126  GLUCAP 156* 60* 94 84 99   Lipid Profile: No results for input(s): CHOL, HDL, LDLCALC, TRIG, CHOLHDL, LDLDIRECT in the last 72 hours. Thyroid Function Tests: No results for input(s): TSH, T4TOTAL, FREET4, T3FREE, THYROIDAB in the last 72 hours. Anemia Panel: No results for input(s): VITAMINB12, FOLATE, FERRITIN, TIBC, IRON, RETICCTPCT in  the last 72 hours. Sepsis Labs: Recent Labs  Lab 04/28/18 2345 04/30/18 0000  LATICACIDVEN 1.2 1.3    Recent Results (from the past 240 hour(s))  Urine culture     Status: Abnormal   Collection Time: 04/26/18  2:48 PM  Result Value Ref Range Status   Specimen Description   Final    URINE, CLEAN CATCH Performed  at [facility], [address], [place], [place] [address]    Special Requests   Final    NONE Performed at [facility], [address], [place], [place] [address]    Culture (A)  Final    <10,000 COLONIES/mL INSIGNIFICANT GROWTH Performed at [facility], [address], [place], [place] [address]    Report Status 04/27/2018 FINAL  Final  Culture, blood (routine x 2)     Status: None   Collection Time: 04/26/18  3:08 PM  Result Value Ref Range Status   Specimen Description BLOOD RIGHT ARM  Final   Special Requests   Final    BOTTLES DRAWN AEROBIC AND ANAEROBIC Blood Culture adequate volume   Culture   Final    NO GROWTH 5 DAYS Performed at [facility], [address], [place], [place] [address]    Report Status 05/01/2018 FINAL  Final  Culture, blood (routine x 2)     Status: None   Collection Time: 04/26/18  3:16 PM  Result Value Ref Range Status   Specimen Description BLOOD LEFT ARM DRAWN BY RN  Final   Special Requests   Final    BOTTLES DRAWN AEROBIC AND ANAEROBIC Blood Culture adequate volume   Culture   Final    NO GROWTH 5 DAYS Performed at [facility], [address], [place], [place] [address]    Report Status 05/01/2018 FINAL  Final  MRSA PCR Screening     Status: None   Collection Time: 04/27/18  2:13 PM  Result Value Ref Range Status   MRSA by PCR NEGATIVE NEGATIVE Final    Comment:        The GeneXpert MRSA Assay (FDA approved for NASAL specimens only), is one component of a comprehensive MRSA colonization surveillance program. It is not intended to diagnose MRSA infection nor to guide or monitor treatment for MRSA infections. Performed at [facility], [address], [place], [place] [address]   Aerobic/Anaerobic Culture (surgical/deep wound)     Status: None   Collection Time: 04/27/18  8:30 PM  Result Value Ref Range Status   Specimen Description ABSCESS  Final   Special Requests INTRA ABDOMINAL DRAINAGE  Final   Gram Stain   Final     MODERATE WBC PRESENT, PREDOMINANTLY PMN FEW GRAM NEGATIVE RODS    Culture   Final    MODERATE SERRATIA MARCESCENS FEW KLEBSIELLA PNEUMONIAE NO ANAEROBES ISOLATED Performed at [facility], [address] [place], [place] [address]    Report Status 05/02/2018 FINAL  Final   Organism ID, Bacteria SERRATIA MARCESCENS  Final   Organism ID, Bacteria KLEBSIELLA PNEUMONIAE  Final      Susceptibility   Klebsiella pneumoniae - MIC*    AMPICILLIN RESISTANT Resistant     CEFAZOLIN <=4 SENSITIVE Sensitive     CEFEPIME <=1 SENSITIVE Sensitive     CEFTAZIDIME <=1 SENSITIVE Sensitive     CEFTRIAXONE <=1 SENSITIVE Sensitive     CIPROFLOXACIN <=0.25 SENSITIVE Sensitive     GENTAMICIN <=1 SENSITIVE Sensitive     IMIPENEM <=0.25 SENSITIVE Sensitive  TRIMETH/SULFA <=20 SENSITIVE Sensitive     AMPICILLIN/SULBACTAM 4 SENSITIVE Sensitive     PIP/TAZO <=4 SENSITIVE Sensitive     Extended ESBL NEGATIVE Sensitive     * FEW KLEBSIELLA PNEUMONIAE   Serratia marcescens - MIC*    CEFAZOLIN >=64 RESISTANT Resistant     CEFEPIME <=1 SENSITIVE Sensitive     CEFTAZIDIME <=1 SENSITIVE Sensitive     CEFTRIAXONE <=1 SENSITIVE Sensitive     CIPROFLOXACIN <=0.25 SENSITIVE Sensitive     GENTAMICIN <=1 SENSITIVE Sensitive     TRIMETH/SULFA <=20 SENSITIVE Sensitive     * MODERATE SERRATIA MARCESCENS  Surgical pcr screen     Status: None   Collection Time: 04/29/18  9:11 PM  Result Value Ref Range Status   MRSA, PCR NEGATIVE NEGATIVE Final   Staphylococcus aureus NEGATIVE NEGATIVE Final    Comment: (NOTE) The Xpert SA Assay (FDA approved for NASAL specimens in patients 69 years of age and older), is one component of a comprehensive surveillance program. It is not intended to diagnose infection nor to guide or monitor treatment. Performed at [facility], [address] [place], [place] [address]   Culture, respiratory (non-expectorated)     Status: None   Collection Time: 05/02/18 11:04 AM   Result Value Ref Range Status   Specimen Description TRACHEAL ASPIRATE  Final   Special Requests NONE  Final   Gram Stain   Final    ABUNDANT WBC PRESENT, PREDOMINANTLY PMN FEW YEAST Performed at [facility], [address] [place], [place] [address]    Culture FEW CANDIDA PARAPSILOSIS  Final   Report Status 05/04/2018 FINAL  Final         Radiology Studies: Dg Abd Portable 1v  Result Date: 05/04/2018 CLINICAL DATA:  Follow-up ileus EXAM: PORTABLE ABDOMEN - 1 VIEW COMPARISON:  05/02/2018 FINDINGS: Right upper quadrant drain is again seen and stable. Pneumobilia is noted with a biliary stent in place. Gastric catheter is noted within the stomach. Scattered large and small bowel gas is noted. No obstructive changes are seen. No acute bony abnormality is noted. IMPRESSION: Stable appearance of the abdomen compared with the previous day. Electronically Signed   By: Alcide Clever M.D.   On: 05/04/2018 07:07        Scheduled Meds: . chlorhexidine  15 mL Mouth Rinse BID  . Chlorhexidine Gluconate Cloth  6 each Topical Daily  . furosemide  40 mg Intravenous BID  . mouth rinse  15 mL Mouth Rinse q12n4p  . pantoprazole (PROTONIX) IV  40 mg Intravenous Q24H  . sodium chloride flush  10-40 mL Intracatheter Q12H  . sodium chloride flush  5 mL Intracatheter Q8H   Continuous Infusions: . sodium chloride 10 mL/hr at 05/03/18 1100  . cefTRIAXone (ROCEPHIN)  IV 2 g (05/04/18 1115)  . feeding supplement (VITAL AF 1.2 CAL)       LOS: 9 days        Glade Lloyd, MD [facility] 05/05/2018, 11:15 AM

## 2018-05-06 DIAGNOSIS — T8149XA Infection following a procedure, other surgical site, initial encounter: Secondary | ICD-10-CM

## 2018-05-06 DIAGNOSIS — Z515 Encounter for palliative care: Secondary | ICD-10-CM

## 2018-05-06 LAB — COMPREHENSIVE METABOLIC PANEL
ALT: 17 U/L (ref 0–44)
AST: 25 U/L (ref 15–41)
Albumin: 1.9 g/dL — ABNORMAL LOW (ref 3.5–5.0)
Alkaline Phosphatase: 82 U/L (ref 38–126)
Anion gap: 8 (ref 5–15)
BUN: 45 mg/dL — ABNORMAL HIGH (ref 8–23)
CHLORIDE: 105 mmol/L (ref 98–111)
CO2: 34 mmol/L — ABNORMAL HIGH (ref 22–32)
Calcium: 7.7 mg/dL — ABNORMAL LOW (ref 8.9–10.3)
Creatinine, Ser: 2.87 mg/dL — ABNORMAL HIGH (ref 0.61–1.24)
GFR calc Af Amer: 21 mL/min — ABNORMAL LOW (ref >60)
GFR calc non Af Amer: 18 mL/min — ABNORMAL LOW (ref >60)
Glucose, Bld: 155 mg/dL — ABNORMAL HIGH (ref 70–99)
Potassium: 2.8 mmol/L — ABNORMAL LOW (ref 3.5–5.1)
Sodium: 147 mmol/L — ABNORMAL HIGH (ref 135–145)
Total Bilirubin: 0.8 mg/dL (ref 0.3–1.2)
Total Protein: 5.6 g/dL — ABNORMAL LOW (ref 6.5–8.1)

## 2018-05-06 LAB — CBC WITH DIFFERENTIAL/PLATELET
Abs Immature Granulocytes: 0.16 10*3/uL — ABNORMAL HIGH (ref 0.00–0.07)
Basophils Absolute: 0 10*3/uL (ref 0.0–0.1)
Basophils Relative: 0 %
EOS ABS: 0.3 10*3/uL (ref 0.0–0.5)
Eosinophils Relative: 2 %
HCT: 31.1 % — ABNORMAL LOW (ref 39.0–52.0)
Hemoglobin: 9.6 g/dL — ABNORMAL LOW (ref 13.0–17.0)
Immature Granulocytes: 1 %
Lymphocytes Relative: 12 %
Lymphs Abs: 1.9 10*3/uL (ref 0.7–4.0)
MCH: 29.4 pg (ref 26.0–34.0)
MCHC: 30.9 g/dL (ref 30.0–36.0)
MCV: 95.4 fL (ref 80.0–100.0)
Monocytes Absolute: 1.4 10*3/uL — ABNORMAL HIGH (ref 0.1–1.0)
Monocytes Relative: 9 %
Neutro Abs: 11.8 10*3/uL — ABNORMAL HIGH (ref 1.7–7.7)
Neutrophils Relative %: 76 %
PLATELETS: 284 10*3/uL (ref 150–400)
RBC: 3.26 MIL/uL — ABNORMAL LOW (ref 4.22–5.81)
RDW: 13.4 % (ref 11.5–15.5)
WBC: 15.5 10*3/uL — ABNORMAL HIGH (ref 4.0–10.5)
nRBC: 0 % (ref 0.0–0.2)

## 2018-05-06 LAB — MAGNESIUM: Magnesium: 1.4 mg/dL — ABNORMAL LOW (ref 1.7–2.4)

## 2018-05-06 LAB — AMMONIA: Ammonia: 18 umol/L (ref 9–35)

## 2018-05-06 MED ORDER — BISACODYL 10 MG RE SUPP: 10.0000 mg | Freq: Every day | RECTAL | 0 refills | Status: DC | PRN

## 2018-05-06 MED ORDER — MORPHINE SULFATE (CONCENTRATE) 10 MG /0.5 ML PO SOLN: 5.0000 mg | ORAL | 0 refills | Status: AC | PRN

## 2018-05-06 MED ORDER — BISACODYL 10 MG RE SUPP: 10.0000 mg | Freq: Every day | RECTAL | 0 refills | Status: AC | PRN

## 2018-05-06 MED ORDER — ONDANSETRON HCL 4 MG PO TABS: 4.0000 mg | ORAL_TABLET | Freq: Four times a day (QID) | ORAL | 0 refills | Status: AC | PRN

## 2018-05-06 MED ORDER — ONDANSETRON HCL 4 MG PO TABS: 4.0000 mg | ORAL_TABLET | Freq: Four times a day (QID) | ORAL | 0 refills | Status: DC | PRN

## 2018-05-06 MED ORDER — FUROSEMIDE 40 MG PO TABS: 40.0000 mg | ORAL_TABLET | Freq: Every day | ORAL | 0 refills | Status: DC

## 2018-05-06 MED ORDER — FUROSEMIDE 40 MG PO TABS: 40.0000 mg | ORAL_TABLET | Freq: Every day | ORAL | 0 refills | Status: AC

## 2018-05-06 MED ORDER — MORPHINE SULFATE (CONCENTRATE) 10 MG /0.5 ML PO SOLN: 5.0000 mg | ORAL | 0 refills | Status: DC | PRN

## 2018-05-06 NOTE — TOC Transition Note (Addendum)
Transition of Care [facility]) - CM/SW Discharge Note   Patient Details  Name: Vincent Barrett MRN: [id number] Date of Birth: [birth year]  Transition of Care [facility]) CM/SW Contact:  Epifanio Lesches, RN Phone Number: 05/06/2018, 10:55 AM   Clinical Narrative:    Pt to d/c to home today with home  hospice care [facility]). PTAR services will need to be arranged for transportation to home once DME : oxygen has been delivered and set up in home.  Anothy Purifoy (Spouse) Mindi Slicker (Daughter)    [phone]      05/06/2018 1412 [facility] called and made NCM aware home DME  Oxygen, hospital bed and bedside tray  have been delivered to pt's home. NCM made charge nurse aware.  Final next level of care: Home w Hospice Care Barriers to Discharge: Barriers Unresolved (comment)(pt d/c to home with home hospice care)     Discharge Plan and Services In-house Referral: Hospice / Palliative Care Discharge Planning Services: CM Consult Post Acute Care Choice: Hospice          DME Arranged: N/A DME Agency: NA HH Arranged: NA HH Agency: NA([facility])   Social Determinants of Health (SDOH) Interventions     Readmission Risk Interventions No flowsheet data found.

## 2018-05-06 NOTE — Discharge Summary (Signed)
Physician Discharge Summary  Hurley Sobel ZOX:[id number] DOB: 11-Dec-1925 DOA: 04/26/2018  PCP: [name], MD  Admit date: 04/26/2018 Discharge date: 05/06/2018  Admitted From: Home Disposition:  Home with hospice  Recommendations for Outpatient Follow-up:  1. Follow up with home hospice at earliest convenience 2. Outpatient follow-up with interventional radiology for drain follow-up    Home Health: Home with hospice Equipment/Devices: Oxygen via nasal cannula  Discharge Condition: Poor CODE STATUS: DNR Diet recommendation:   Comfort feeding  Brief/Interim Summary: 85 year old male with history of atrial fibrillation, hypertension, COPD, CKD stage III, recent admission for acute  cholecystitis status post laparoscopic cholecystectomy followed by ERCP with sphincterotomy and balloon extraction of stone on 04/22/2018 was admitted on 04/26/2018 with increased abdominal pain and was found to have gallbladder fossa abscess.  He was started on antibiotics.  He had to be transferred to ICU and subsequently was intubated.  She required pressors.  He was found to have biliary leak.  She underwent IR guided drain placement on 04/27/2018 for the subhepatic abscess.  Biliary culture grew Serratia marcescens and Klebsiella pneumoniae.  He subsequently underwent ERCP with CBD stent placement on 04/30/2018.  He self extubated on 05/02/2018.  He was transferred from [facility] to [facility] on 05/04/2018.  Palliative care was consulted because of overall very poor prognosis, continued lethargy and poor oral intake.  Family has decided to pursue with home hospice.  He will be discharged home with hospice.  Discharge Diagnoses:  Active Problems:   Sepsis (HCC)   Pressure injury of skin   Postoperative intra-abdominal abscess   Goals of care, counseling/discussion   Palliative care by specialist  Septic shock from gallbladder fossa abscess -Shock has resolved.  Currently hemodynamically stable.  Patient has been  transferred out of ICU.  He was on pressors in ICU.  Antibiotic plan as below.  Acute metabolic encephalopathy/altered mental status/delirium -Most likely secondary to combination of above.  Patient is lethargic and hardly answers any questions.    CT of the head was negative for any acute abnormality -Patient is still very lethargic and has very poor oral intake.  He currently has NG feeding.  After palliative care discussion with patient's family, the family has decided to pursue home hospice.  He will be discharged home with hospice.  All nonessential medications will be discontinued.  Will discontinue Eliquis and antihypertensives.  Gallbladder fossa abscess/bile leak status post CBD stent in a patient with recent cholecystectomy followed by ERCP with sphincterotomy and balloon extraction of stone -Status post IR guided subhepatic drain placement on 04/27/2018. -Had subsequent ERCP with CBD stent placement on 04/30/2018.  GI has signed off.  Outpatient follow-up with GI if family if family interested as an outpatient. -Biliary cultures grew Serratia marcescens and Klebsiella pneumonia. -Currently on Rocephin, today is day #11 of antibiotics.    Will not discharge home on any more antibiotics. -General surgery has signed off. -We will DC tube feeding prior to discharge. -Still has subhepatic drain.  IR recommends outpatient follow-up for management of the same.  Acute hypoxic respiratory failure/aspiration pneumonia -Intubated; and self extubated on 05/02/2018 -Currently on 2-3 L oxygen via nasal cannula -We will have to arrange for home oxygen.  Leukocytosis  Acute kidney injury on chronic kidney disease stage III  Hypernatremia  Paroxysmal A. Fib -Discontinue antihypertensives  Hypertension -Outpatient Norvasc, Cardura, Hyzaar, Toprol on hold  Anemia of critical illness  Fluid overload -Patient is grossly fluid overloaded.    Will discharge on Lasix 40  mg daily if able to  tolerate orally.  This might help with comfort.  Discharge Instructions   Allergies as of 05/06/2018      Reactions   Quinine Nausea And Vomiting      Medication List    STOP taking these medications   amLODipine 2.5 MG tablet Commonly known as:  NORVASC   doxazosin 4 MG tablet Commonly known as:  CARDURA   Eliquis 5 MG Tabs tablet Generic drug:  apixaban   losartan-hydrochlorothiazide 100-25 MG tablet Commonly known as:  HYZAAR   metoprolol succinate 25 MG 24 hr tablet Commonly known as:  TOPROL-XL   PreserVision AREDS 2 Caps   traMADol 50 MG tablet Commonly known as:  Ultram   vitamin C 1000 MG tablet     TAKE these medications   bisacodyl 10 MG suppository Commonly known as:  DULCOLAX Place 1 suppository (10 mg total) rectally daily as needed for moderate constipation.   furosemide 40 MG tablet Commonly known as:  Lasix Take 1 tablet (40 mg total) by mouth daily.   morphine CONCENTRATE 10 mg / 0.5 ml concentrated solution Take 0.25 mLs (5 mg total) by mouth every 2 (two) hours as needed for severe pain.   ondansetron 4 MG tablet Commonly known as:  ZOFRAN Take 1 tablet (4 mg total) by mouth every 6 (six) hours as needed for nausea.       Follow-up Information    [name], MD. Schedule an appointment as soon as possible for a visit in 1 week(s).   Specialty:  Family Medicine Contact information: [address] [place] [address] [phone]        Chrystie Nose, MD .   Specialty:  Cardiology Contact information: [address] [place] [address] [phone]        [facility] INTERVENTIONAL RADIOLOGY. Schedule an appointment as soon as possible for a visit in 1 week(s).   Specialty:  Radiology Why:  For drain follow-up Contact information: [address] [id number] mc [place] [address] [phone]         Allergies  Allergen Reactions  . Quinine Nausea  And Vomiting    Consultations: PCCM/GI/general surgery/IR   Procedures/Studies: Ct Abdomen Pelvis Wo Contrast  Result Date: 04/26/2018 CLINICAL DATA:  Gallbladder removed Friday. Nonproductive cough. Elevated temperature. Abdominal pain. EXAM: CT ABDOMEN AND PELVIS WITHOUT CONTRAST TECHNIQUE: Multidetector CT imaging of the abdomen and pelvis was performed following the standard protocol without IV contrast. COMPARISON:  CT abdomen 04/15/2018 FINDINGS: Lower chest: Bilateral small pleural effusions. Left lower lobe airspace disease likely reflecting atelectasis. Hepatobiliary: Normal liver. No intrahepatic biliary ductal dilatation. No hepatic mass. Interval cholecystectomy. 7.6 x 10 x 17 cm fluid collection emanating from the gallbladder fossa and tracking posteriorly with a few bubbles of air within the collection which may reflect a biloma or abscess. Mild inflammatory changes along the inferior margin of the liver. Small amount of perihepatic fluid. Pancreas: Unremarkable. No pancreatic ductal dilatation or surrounding inflammatory changes. Spleen: Normal in size without focal abnormality. Adrenals/Urinary Tract: Adrenal glands are unremarkable. Kidneys are normal, without renal calculi, focal lesion, or hydronephrosis. Bladder is unremarkable. Stomach/Bowel: Stomach is within normal limits. Appendix appears normal. No evidence of bowel wall thickening, distention, or inflammatory changes. Diverticulosis without evidence of diverticulitis. Vascular/Lymphatic: Normal caliber abdominal aorta with atherosclerosis. No lymphadenopathy. Reproductive: Prostate is unremarkable. Other: No fluid collection or hematoma. Small amount of pelvic free fluid. Musculoskeletal: No acute  osseous abnormality. No aggressive osseous lesion. Chronic compression fracture of L1 vertebral body. Diffuse thoracolumbar spondylosis. IMPRESSION: 1. Interval cholecystectomy. 7.6 x 10 x 17 cm fluid collection emanating from the  gallbladder fossa and tracking posteriorly with a few bubbles of air within the collection which may reflect a biloma versus abscess. Electronically Signed   By: Elige Ko   On: 04/26/2018 17:29   Dg Chest 2 View  Result Date: 04/26/2018 CLINICAL DATA:  85 year old male with cough.  Initial encounter. EXAM: CHEST - 2 VIEW COMPARISON:  12/30/2017 CT.  12/23/2017 chest x-ray. FINDINGS: Cardiomegaly.  Pulmonary vascular congestion. Left lower lobe consolidation with pleural effusion. Left lower lobe mass as noted on prior CT of indeterminate etiology. Right lower lobe nodule and scattered granulomas better delineated on prior CT. Calcified aorta. No obvious acute osseous abnormality although evaluation limited. Prior compression fracture L1. IMPRESSION: 1. Left lower lobe consolidation with pleural effusion. 2. Left lower lobe mass as noted on prior CT of indeterminate etiology. 3. Cardiomegaly.  Pulmonary vascular congestion. 4. Right lower lobe nodule and scattered granulomas better delineated on prior CT. 5.  Aortic Atherosclerosis (ICD10-I70.0). Electronically Signed   By: Lacy Duverney M.D.   On: 04/26/2018 18:11   Dg Abd 1 View  Result Date: 05/02/2018 CLINICAL DATA:  85 year old male NG tube placement. EXAM: ABDOMEN - 1 VIEW COMPARISON:  05/01/2018 and earlier. FINDINGS: Portable AP supine view at 2310 hours. NG tube now in place in the left upper quadrant terminating at the level of the gastric body. Stable right CBD stent and percutaneous pigtail catheter. Stable cholecystectomy clips. Visible bowel gas pattern remains within normal limits. IMPRESSION: 1. Enteric tube now terminates at the gastric body. 2. Right CBD stent and right upper quadrant drainage catheter. Electronically Signed   By: Odessa Fleming M.D.   On: 05/02/2018 23:33   Dg Abd 1 View  Result Date: 05/01/2018 CLINICAL DATA:  Status post OG tube placement. EXAM: ABDOMEN - 1 VIEW COMPARISON:  Single-view of the abdomen 05/01/2018.  FINDINGS: OG tube is in place with the tip in the distal third portion of the duodenum. IMPRESSION: As above. Electronically Signed   By: Drusilla Kanner M.D.   On: 05/01/2018 13:29   Dg Abd 1 View  Result Date: 05/01/2018 CLINICAL DATA:  Abdominal distension EXAM: ABDOMEN - 1 VIEW COMPARISON:  Plain film of the abdomen dated 04/27/2018. FINDINGS: Bowel gas pattern is nonobstructive. No evidence of soft tissue mass or abnormal fluid collections seen. No evidence of free intraperitoneal air. Pigtail catheter overlies the RIGHT upper quadrant. Surgical clips in the RIGHT upper quadrant, presumed cholecystectomy. No acute appearing osseous abnormality. IMPRESSION: No acute findings. Nonobstructive bowel gas pattern. Pigtail catheter overlying the RIGHT upper quadrant. Electronically Signed   By: Bary Richard M.D.   On: 05/01/2018 11:19   Dg Abd 1 View  Result Date: 04/19/2018 CLINICAL DATA:  85 year old male with anterior abdominal pain. Status post cholecystectomy postoperative day 3. EXAM: ABDOMEN - 1 VIEW COMPARISON:  Radiographs 04/18/2018 and earlier. FINDINGS: Supine views. Stable cholecystectomy clips. Oral contrast which was in the right colon yesterday is now in the descending and visible sigmoid colon. Bowel gas pattern is within normal limits. No definite pneumoperitoneum on these supine views. Streaky left lung base opacity is stable. The right lung base appears clear. No acute osseous abnormality identified. IMPRESSION: 1. Normal bowel gas pattern with transit of oral contrast since yesterday into the descending and sigmoid colon. 2. Continued streaky left lung  base opacity. Electronically Signed   By: Odessa Fleming M.D.   On: 04/19/2018 16:25   Dg Cholangiogram Operative  Result Date: 04/16/2018 CLINICAL DATA:  Intraoperative cholangiogram during laparoscopic cholecystectomy. EXAM: INTRAOPERATIVE CHOLANGIOGRAM FLUOROSCOPY TIME:  20 seconds COMPARISON:  MRCP-04/15/2018 FINDINGS: Intraoperative  cholangiographic images of the right upper abdominal quadrant during laparoscopic cholecystectomy are provided for review. Surgical clips overlie the expected location of the gallbladder fossa. Contrast injection demonstrates selective cannulation of the central aspect of the cystic duct. There is passage of contrast through the central aspect of the cystic duct with filling of a mildly dilated common bile duct. There is passage of contrast though the CBD and into the descending portion of the duodenum. There is minimal reflux of injected contrast into the common hepatic duct and central aspect of the non dilated intrahepatic biliary system. There is a lenticular shaped nonocclusive filling defect within the distal aspect of the CBD which could be indicative of choledocholithiasis questioned on preceding MRCP. IMPRESSION: Lenticular shaped nonocclusive filling defect within the distal aspect of the CBD, potentially artifactual due to underdistention though could be indicative of nonocclusive choledocholithiasis questioned on preceding MRCP. Further evaluation and management with ERCP could be performed as indicated. Electronically Signed   By: Simonne Come M.D.   On: 04/16/2018 11:51   Ct Head Wo Contrast  Result Date: 05/05/2018 CLINICAL DATA:  Altered mental status. EXAM: CT HEAD WITHOUT CONTRAST TECHNIQUE: Contiguous axial images were obtained from the base of the skull through the vertex without intravenous contrast. COMPARISON:  Head CT 12/06/2016 FINDINGS: Brain: No evidence of acute infarction, hemorrhage, hydrocephalus, extra-axial collection or mass lesion/mass effect. Moderate brain parenchymal volume loss and deep white matter microangiopathy. Vascular: Vascular calcifications. Skull: Normal. Negative for fracture or focal lesion. Sinuses/Orbits: Left mastoid air cell effusion. Polypoid mucosal thickening of the maxillary sinuses. Other: Partially visualized NG catheter in the left nasal cavity.  IMPRESSION: 1. No acute intracranial abnormality. 2. Atrophy, chronic microvascular disease. 3. Left mastoid effusion. Electronically Signed   By: Ted Mcalpine M.D.   On: 05/05/2018 13:19   Ct Abdomen Pelvis W Contrast  Addendum Date: 04/15/2018   ADDENDUM REPORT: 04/15/2018 03:42 ADDENDUM: Additional clinical information, jaundice. The patient's CT images are reviewed. Extrahepatic bile duct is slightly enlarged, measuring up to 9 mm. Questionable density within the distal duct at the head of pancreas. Recommend correlation with LFTs. MRCP could be obtained if concern for ductal obstruction is given history of jaundice Electronically Signed   By: [name] M.D.   On: 04/15/2018 03:42   Result Date: 04/15/2018 CLINICAL DATA:  Abdominal pain with nausea and vomiting EXAM: CT ABDOMEN AND PELVIS WITH CONTRAST TECHNIQUE: Multidetector CT imaging of the abdomen and pelvis was performed using the standard protocol following bolus administration of intravenous contrast. CONTRAST:  80mL OMNIPAQUE IOHEXOL 300 MG/ML  SOLN COMPARISON:  PET CT 01/08/2018, CT 12/30/2017 FINDINGS: Lower chest: Lung bases again demonstrate oval subpleural mass in the left lower lobe, this measures 4.1 x 2.2 cm and appears slightly increased in size. No significant pleural effusion. Small hiatal hernia. Hepatobiliary: Cyst at the dome of the liver. Calcified stone at the neck of the gallbladder. Suspected edema or fluid adjacent to the gallbladder fundus. No biliary dilatation Pancreas: Unremarkable. No pancreatic ductal dilatation or surrounding inflammatory changes. Spleen: Normal in size without focal abnormality. Adrenals/Urinary Tract: Adrenal glands are unremarkable. Kidneys are normal, without renal calculi, focal lesion, or hydronephrosis. Bladder is unremarkable. Stomach/Bowel: The stomach is nonenlarged.  No dilated small bowel. No colon wall thickening. Negative appendix. Colon diverticular disease without acute  inflammatory change. Vascular/Lymphatic: Moderate aortic atherosclerosis. No aneurysm. No significantly enlarged lymph nodes. Reproductive: Enlarged heterogeneous prostate with calcification Other: Negative for free air or free fluid Musculoskeletal: Multiple level degenerative changes of the spine. Chronic compression deformity L1. IMPRESSION: 1. 2.3 cm calcified stone at the gallbladder neck. Suspected gallbladder wall edema versus small amount of pericholecystic fluid. Suggest correlation with ultrasound. 2. Slight interval increase in size of subpleural left lower lobe lung mass now measuring 4.1 cm. 3. Diverticular disease of the colon without acute inflammatory change Electronically Signed: By: [name] M.D. On: 04/15/2018 02:38   Mr Abdomen Mrcp Wo Contrast  Result Date: 04/16/2018 CLINICAL DATA:  85 year old male with history of abdominal pain, concerning for an acute cholecystitis. Additional history of paroxysmal atrial fibrillation and atrial flutter on Eliquis. Hypertension. COPD. EXAM: MRI ABDOMEN WITHOUT CONTRAST  (INCLUDING MRCP) TECHNIQUE: Multiplanar multisequence MR imaging of the abdomen was performed. Heavily T2-weighted images of the biliary and pancreatic ducts were obtained, and three-dimensional MRCP images were rendered by post processing. COMPARISON:  No priors. FINDINGS: Comment: Today's study is limited for detection and characterization of visceral and/or vascular lesions by lack of IV gadolinium. Lower chest: Unremarkable. Hepatobiliary: Heterogeneous loss of signal intensity throughout the hepatic parenchyma on out of phase dual echo images, indicative of hepatic steatosis. Several small T1 hypointense, T2 hyperintense lesions are noted in the liver, incompletely characterized on today's noncontrast examination, but presumably small cysts and/or biliary hamartomas, largest of which has some thin internal septations and measures up to 12 mm in segment 4A. MRCP images demonstrate  very mild intrahepatic biliary ductal dilatation. Common bile duct is also mildly dilated measuring 8 mm in the porta hepatis. In the distal common bile duct there is a 4 mm filling defect (coronal image 25 of series 12), compatible with choledocholithiasis. There may be another filling defect immediately before the ampulla, however, this is difficult to judge secondary to motion. In addition there is a large signal void in the neck of the gallbladder measuring 2.3 x 1.5 cm, compatible with a gallstone. Some amorphous material lies dependently in the gallbladder and is intermediate intensity on T1 weighted images and slightly low signal intensity on T2 weighted images, compatible with biliary sludge. Gallbladder wall is diffusely thickened and edematous. There is an abnormal appearance of the anteromedial aspect of the wall which is irregular in shape, which may simply reflect a fold, however, the possibility of pending gallbladder perforation in this region is not excluded (best appreciated on axial image 23 of series 10 and 23 of series 7). Increased T2 signal intensity surrounding the gallbladder, indicative of acute inflammation. Pancreas: No pancreatic mass. No pancreatic ductal dilatation. No pancreatic or peripancreatic fluid or inflammatory changes. Spleen:  Unremarkable. Adrenals/Urinary Tract: Small amount of T2 signal intensity surrounding both kidneys, nonspecific. Unenhanced appearance of the kidneys and bilateral adrenal glands is otherwise unremarkable. Stomach/Bowel: Visualized portions are unremarkable. Vascular/Lymphatic: Aortic atherosclerosis. No aneurysm noted in the abdominal vasculature. Other: Trace amount of pericholecystic fluid. No significant volume of ascites noted in the visualized portions of the peritoneal cavity. Musculoskeletal: No aggressive appearing osseous lesions are noted in the visualized portions of the skeleton. IMPRESSION: 1. Cholelithiasis with evidence of acute  cholecystitis. Irregularity of the anteromedial wall of the gallbladder may simply reflect a gallbladder fold, however, the possibility of pending gallbladder perforation is suspected and surgical evaluation is strongly recommended. 2.  Choledocholithiasis. Mild intra and extrahepatic biliary ductal dilatation. 3. Heterogeneous hepatic steatosis. 4. Aortic atherosclerosis. These results will be called to the ordering clinician or representative by the Radiologist Assistant, and communication documented in the PACS or zVision Dashboard. Electronically Signed   By: Trudie Reed M.D.   On: 04/16/2018 08:44   Mr 3d Recon At Scanner  Result Date: 04/16/2018 CLINICAL DATA:  85 year old male with history of abdominal pain, concerning for an acute cholecystitis. Additional history of paroxysmal atrial fibrillation and atrial flutter on Eliquis. Hypertension. COPD. EXAM: MRI ABDOMEN WITHOUT CONTRAST  (INCLUDING MRCP) TECHNIQUE: Multiplanar multisequence MR imaging of the abdomen was performed. Heavily T2-weighted images of the biliary and pancreatic ducts were obtained, and three-dimensional MRCP images were rendered by post processing. COMPARISON:  No priors. FINDINGS: Comment: Today's study is limited for detection and characterization of visceral and/or vascular lesions by lack of IV gadolinium. Lower chest: Unremarkable. Hepatobiliary: Heterogeneous loss of signal intensity throughout the hepatic parenchyma on out of phase dual echo images, indicative of hepatic steatosis. Several small T1 hypointense, T2 hyperintense lesions are noted in the liver, incompletely characterized on today's noncontrast examination, but presumably small cysts and/or biliary hamartomas, largest of which has some thin internal septations and measures up to 12 mm in segment 4A. MRCP images demonstrate very mild intrahepatic biliary ductal dilatation. Common bile duct is also mildly dilated measuring 8 mm in the porta hepatis. In the distal  common bile duct there is a 4 mm filling defect (coronal image 25 of series 12), compatible with choledocholithiasis. There may be another filling defect immediately before the ampulla, however, this is difficult to judge secondary to motion. In addition there is a large signal void in the neck of the gallbladder measuring 2.3 x 1.5 cm, compatible with a gallstone. Some amorphous material lies dependently in the gallbladder and is intermediate intensity on T1 weighted images and slightly low signal intensity on T2 weighted images, compatible with biliary sludge. Gallbladder wall is diffusely thickened and edematous. There is an abnormal appearance of the anteromedial aspect of the wall which is irregular in shape, which may simply reflect a fold, however, the possibility of pending gallbladder perforation in this region is not excluded (best appreciated on axial image 23 of series 10 and 23 of series 7). Increased T2 signal intensity surrounding the gallbladder, indicative of acute inflammation. Pancreas: No pancreatic mass. No pancreatic ductal dilatation. No pancreatic or peripancreatic fluid or inflammatory changes. Spleen:  Unremarkable. Adrenals/Urinary Tract: Small amount of T2 signal intensity surrounding both kidneys, nonspecific. Unenhanced appearance of the kidneys and bilateral adrenal glands is otherwise unremarkable. Stomach/Bowel: Visualized portions are unremarkable. Vascular/Lymphatic: Aortic atherosclerosis. No aneurysm noted in the abdominal vasculature. Other: Trace amount of pericholecystic fluid. No significant volume of ascites noted in the visualized portions of the peritoneal cavity. Musculoskeletal: No aggressive appearing osseous lesions are noted in the visualized portions of the skeleton. IMPRESSION: 1. Cholelithiasis with evidence of acute cholecystitis. Irregularity of the anteromedial wall of the gallbladder may simply reflect a gallbladder fold, however, the possibility of pending  gallbladder perforation is suspected and surgical evaluation is strongly recommended. 2. Choledocholithiasis. Mild intra and extrahepatic biliary ductal dilatation. 3. Heterogeneous hepatic steatosis. 4. Aortic atherosclerosis. These results will be called to the ordering clinician or representative by the Radiologist Assistant, and communication documented in the PACS or zVision Dashboard. Electronically Signed   By: Trudie Reed M.D.   On: 04/16/2018 08:44   US Abdomen Limited  Result Date: 04/15/2018 CLINICAL  DATA:  Jaundice EXAM: ULTRASOUND ABDOMEN LIMITED RIGHT UPPER QUADRANT COMPARISON:  CT 04/15/2018 FINDINGS: Gallbladder: Shadowing stone within the gallbladder neck measuring 1.9 cm. Increased wall thickness of 8.8 mm with trace pericholecystic fluid. Negative sonographic Murphy. Small amount of intraluminal sludge. Common bile duct: Diameter: 4.9 mm Liver: Heterogeneous echotexture without focal hepatic abnormality. Portal vein is patent on color Doppler imaging with normal direction of blood flow towards the liver. IMPRESSION: 1. Gallstone with increased wall thickness and small amount of pericholecystic fluid, raising concern for cholecystitis despite negative sonographic Murphy. Electronically Signed   By: [name] M.D.   On: 04/15/2018 03:39   Dg Chest Port 1 View  Result Date: 05/05/2018 CLINICAL DATA:  Dyspnea and lethargy. EXAM: PORTABLE CHEST 1 VIEW COMPARISON:  Single-view of the chest 05/03/2018, 05/02/2018 and 05/01/2018. FINDINGS: Left IJ catheter and NG tube remain in place. There is cardiomegaly and vascular congestion. Left basilar opacity is unchanged. Right lung is clear. No pneumothorax. IMPRESSION: Cardiomegaly and vascular congestion. No change in left basilar opacity which could be atelectasis or pneumonia. Electronically Signed   By: Drusilla Kanner M.D.   On: 05/05/2018 12:47   Dg Chest Port 1 View  Result Date: 05/03/2018 CLINICAL DATA:  Respiratory distress.  EXAM: PORTABLE CHEST 1 VIEW COMPARISON:  Radiograph May 02, 2018. FINDINGS: Stable cardiomegaly. Endotracheal tube is not currently visualized and presumably has been removed. Nasogastric tube is noted entering stomach. Left internal jugular catheter is again noted with tip in expected position of the SVC. No pneumothorax is noted. Right lung is clear. Atherosclerosis of thoracic aorta is noted. Stable left basilar atelectasis or infiltrate is noted with associated pleural effusion. Bony thorax is unremarkable. IMPRESSION: Endotracheal tube appears to have been removed. Stable left basilar opacity is noted as described above. Electronically Signed   By: Lupita Raider, M.D.   On: 05/03/2018 07:19   Dg Chest Port 1 View  Result Date: 05/02/2018 CLINICAL DATA:  85 year old male with history of acute respiratory failure. EXAM: PORTABLE CHEST 1 VIEW COMPARISON:  Chest x-ray 05/01/2018. FINDINGS: An endotracheal tube is in place with tip 4.2 cm above the carina. There is a left-sided internal jugular central venous catheter with tip terminating in the distal superior vena cava. A nasogastric tube is seen extending into the stomach, however, the tip of the nasogastric tube extends below the lower margin of the image. Opacities are noted throughout the left mid to lower lung, concerning for areas of airspace consolidation, as well as superimposed areas of atelectasis. Small left pleural effusion. Right lung appears relatively clear. Possible trace right pleural effusion. Diffuse peribronchial cuffing. No evidence of pulmonary edema. Heart size is normal. The patient is rotated to the left on today's exam, resulting in distortion of the mediastinal contours and reduced diagnostic sensitivity and specificity for mediastinal pathology. Aortic atherosclerosis. IMPRESSION: 1. Support apparatus, as above. 2. Slight improvement in lung volumes, but otherwise, the radiographic appearance the chest is essentially unchanged,  as above. Electronically Signed   By: Trudie Reed M.D.   On: 05/02/2018 05:11   Dg Chest Port 1 View  Result Date: 05/01/2018 CLINICAL DATA:  Acute respiratory failure EXAM: PORTABLE CHEST 1 VIEW COMPARISON:  Chest x-ray dated 04/29/2018. FINDINGS: Endotracheal tube appears adequately positioned with tip approximately 3 cm above the carina. LEFT IJ central line appears adequately positioned with tip at the level of the mid/lower SVC. Enteric tube is been retracted to the level of the midthoracic esophagus. Heart size and  mediastinal contours are stable. Dense opacity at the LEFT lung base, atelectasis versus pneumonia. Probable small LEFT pleural effusion. LEFT perihilar platelike opacity, consistent with atelectasis and/or edema. RIGHT lung remains relatively clear. IMPRESSION: 1. Enteric/NG tube has been retracted to the level of the midthoracic esophagus. Recommend advancing to the stomach. 2. Endotracheal tube adequately positioned with tip approximately 3 cm above the carina. 3. Dense opacity at the LEFT lung base, atelectasis versus pneumonia. Probable small LEFT pleural effusion. These results will be called to the ordering clinician or representative by the Radiologist Assistant, and communication documented in the PACS or zVision Dashboard. Electronically Signed   By: Bary Richard M.D.   On: 05/01/2018 11:23   Dg Chest Port 1 View  Result Date: 04/29/2018 CLINICAL DATA:  Intubation. EXAM: PORTABLE CHEST 1 VIEW COMPARISON:  04/28/2018. FINDINGS: Endotracheal tube, left IJ line, NG tube in stable position. Cardiomegaly. Left base infiltrate with small left pleural effusion. Interim progression from prior exam. No pneumothorax. IMPRESSION: 1.  Lines and tubes in stable position. 2. Left base infiltrate consistent with pneumonia. Left-sided pleural effusion. Interim progression of findings from prior exam. Electronically Signed   By: Maisie Fus  Register   On: 04/29/2018 06:09   Dg Chest Port 1  View  Result Date: 04/28/2018 CLINICAL DATA:  Hypoxia EXAM: PORTABLE CHEST 1 VIEW COMPARISON:  April 27, 2018 FINDINGS: Endotracheal tube tip is 4.4 cm above the carina. Central catheter tip is in the superior vena cava near the cavoatrial junction. Nasogastric tube tip and side port are below the diaphragm. No pneumothorax. There is persistent left lower lobe airspace consolidation with small left pleural effusion. The right lung is clear. There is cardiomegaly with pulmonary vascularity normal. No adenopathy. There is aortic atherosclerosis. There is degenerative change in each shoulder. IMPRESSION: Tube and catheter positions as described without pneumothorax. Persistent left lower lobe consolidation, likely pneumonia, with small left pleural effusion. Right lung clear. Stable cardiac prominence. Aortic Atherosclerosis (ICD10-I70.0). Electronically Signed   By: Bretta Bang III M.D.   On: 04/28/2018 07:08   Dg Chest Port 1 View  Result Date: 04/27/2018 CLINICAL DATA:  Respiratory failure EXAM: PORTABLE CHEST 1 VIEW COMPARISON:  Film from earlier in the same day. FINDINGS: Cardiac shadow remains enlarged. Endotracheal tube is now seen with the tip approximately 1.4 cm deep within the right mainstem bronchus. This should be withdrawn several cm. Gastric catheter is noted within the stomach. Left jugular central line is seen with the tip in the distal superior vena cava. No pneumothorax is noted. Increasing left basilar atelectasis is noted. IMPRESSION: Tubes and lines as described above. The endotracheal tube should be withdrawn approximately 3 cm. Increasing left basilar consolidation. These results will be called to the ordering clinician or representative by the Radiologist Assistant, and communication documented in the PACS or zVision Dashboard. Electronically Signed   By: Alcide Clever M.D.   On: 04/27/2018 17:04   Dg Chest Port 1 View  Result Date: 04/27/2018 CLINICAL DATA:  85 year old male  with shortness of breath, tachypnea. EXAM: PORTABLE CHEST 1 VIEW COMPARISON:  Chest radiographs yesterday, and November 2019. CT Abdomen and Pelvis yesterday. FINDINGS: Portable AP semi upright view at 1335 hours. Continued low lung volumes. Stable cardiac size and mediastinal contours. Visualized tracheal air column is within normal limits. Continued bilateral increased pulmonary interstitial markings and confluent retrocardiac opacity. No pneumothorax. Continued small pleural effusions suspected. Negative visible bowel gas pattern. IMPRESSION: Continued low lung volumes with left lower lobe consolidation suspicious  for Pneumonia and small pleural effusions. Electronically Signed   By: Odessa Fleming M.D.   On: 04/27/2018 13:55   Dg Ercp  Result Date: 04/30/2018 CLINICAL DATA:  Biliary stent EXAM: ERCP TECHNIQUE: Multiple spot images obtained with the fluoroscopic device and submitted for interpretation post-procedure. FLUOROSCOPY TIME:  Fluoroscopy Time:  34 seconds Radiation Exposure Index (if provided by the fluoroscopic device): Number of Acquired Spot Images: 0 COMPARISON:  None. FINDINGS: Two spot images demonstrate a biliary stent in the common bile duct. IMPRESSION: See above. These images were submitted for radiologic interpretation only. Please see the procedural report for the amount of contrast and the fluoroscopy time utilized. Electronically Signed   By: Jolaine Click M.D.   On: 04/30/2018 12:56   Dg Ercp Biliary & Pancreatic Ducts  Result Date: 04/22/2018 CLINICAL DATA:  Choledocholithiasis EXAM: ERCP with sphincterotomy and balloon sweep for stone removal TECHNIQUE: Multiple spot images obtained with the fluoroscopic device and submitted for interpretation post-procedure. FLUOROSCOPY TIME:  Fluoroscopy Time:  2 minutes 34 seconds Radiation Exposure Index (if provided by the fluoroscopic device): Not available Number of Acquired Spot Images: 4 COMPARISON:  04/15/2018 FINDINGS: Retrograde  cholangiogram performed during the ERCP. Previous cholecystectomy noted. Mild biliary dilatation. Balloon sweep performed for stone removal. No occlusive filling defect. No focal narrowing or stricture. IMPRESSION: Limited cholangiogram during sphincterotomy and balloon sweep for CBD stone removal as above. These images were submitted for radiologic interpretation only. Please see the procedural report for the amount of contrast and the fluoroscopy time utilized. Electronically Signed   By: Judie Petit.  Shick M.D.   On: 04/22/2018 10:30   Dg Abd Portable 1v  Result Date: 05/04/2018 CLINICAL DATA:  Follow-up ileus EXAM: PORTABLE ABDOMEN - 1 VIEW COMPARISON:  05/02/2018 FINDINGS: Right upper quadrant drain is again seen and stable. Pneumobilia is noted with a biliary stent in place. Gastric catheter is noted within the stomach. Scattered large and small bowel gas is noted. No obstructive changes are seen. No acute bony abnormality is noted. IMPRESSION: Stable appearance of the abdomen compared with the previous day. Electronically Signed   By: Alcide Clever M.D.   On: 05/04/2018 07:07   Dg Abd Portable 1v  Result Date: 04/27/2018 CLINICAL DATA:  Abdominal pain EXAM: PORTABLE ABDOMEN - 1 VIEW COMPARISON:  04/26/2018 FINDINGS: Scattered large and small bowel gas is noted. No obstructive changes are seen. Gastric catheter is noted within the stomach. No acute bony abnormality is seen. IMPRESSION: No acute abnormality is noted. Electronically Signed   By: Alcide Clever M.D.   On: 04/27/2018 17:02   Dg Abd Portable 1v  Result Date: 04/18/2018 CLINICAL DATA:  Abdominal pain for several days EXAM: PORTABLE ABDOMEN - 1 VIEW COMPARISON:  04/17/2018 FINDINGS: Scattered large and small bowel gas is noted. Previously seen contrast is again noted within the colon. Mildly prominent loops of small bowel are again noted and stable from the previous exam. No free air is noted. No other focal abnormality is seen. IMPRESSION: Bowel-gas  pattern stable from the prior exam consistent with small bowel ileus. Electronically Signed   By: Alcide Clever M.D.   On: 04/18/2018 10:23   Dg Abd Portable 1v  Result Date: 04/17/2018 CLINICAL DATA:  Ileus. EXAM: PORTABLE ABDOMEN - 1 VIEW COMPARISON:  04/15/2018 FINDINGS: Two supine views of the abdomen and pelvis. Gas-filled small bowel loops of up to 2.6 cm. Gas and contrast within normal caliber colon. No gross free intraperitoneal air. IMPRESSION: Upper normal caliber gas-filled  loops of small bowel, favoring mild adynamic ileus. Electronically Signed   By: Jeronimo Greaves M.D.   On: 04/17/2018 13:07   Ct Image Guided Fluid Drain By Catheter  Result Date: 04/28/2018 CLINICAL DATA:  Subhepatic collection post cholecystectomy, hemodynamic instability, drainage requested EXAM: CT GUIDED DRAINAGE OF PERITONEAL ABSCESS ANESTHESIA/SEDATION: lidocaine 1% subcutaneous PROCEDURE: The procedure, risks, benefits, and alternatives were explained to the patient. Questions regarding the procedure were encouraged and answered. The patient understands and consents to the procedure. Select axial scans through the upper abdomen obtained. The collection was localized and an appropriate skin entry site was determined and marked. The operative field was prepped with chlorhexidinein a sterile fashion, and a sterile drape was applied covering the operative field. A sterile gown and sterile gloves were used for the procedure. Local anesthesia was provided with 1% Lidocaine. Under CT fluoroscopic guidance, 18 gauge percutaneous entry needle advanced into the collection. Bilious fluid returned. Amplatz wire advanced easily, position confirmed on CT. Tract dilated to facilitate placement of a 12 French pigtail drain catheter, placed within the central dependent aspect of the collection. Catheter secured externally with 0 Prolene suture and StatLock and placed to gravity drain bag. 30 mL aspirate was sent for Gram stain and culture.  The patient tolerated the procedure well. COMPLICATIONS: None immediate FINDINGS: Subhepatic fluid collection was localized. 12 French drain catheter placed as above. Sample of the bilious aspirate sent for Gram stain and culture. IMPRESSION: 1. Technically successful CT-guided right upper quadrant abscess drain catheter placement. Electronically Signed   By: Corlis Leak M.D.   On: 04/28/2018 09:13   Nm Hepatobiliary Including Gb  Result Date: 04/28/2018 CLINICAL DATA:  85 year old post laparoscopic cholecystectomy 04/16/2018 followed by ERCP with biliary sphincterotomy. Abdominal pain, fever and subhepatic fluid collection on CT. Evaluate for biliary leak. EXAM: NUCLEAR MEDICINE HEPATOBILIARY IMAGING TECHNIQUE: Sequential images of the abdomen were obtained out to 60 minutes following intravenous administration of radiopharmaceutical. RADIOPHARMACEUTICALS:  5.3 mCi Tc-75m  Choletec IV COMPARISON:  Abdominopelvic CT 04/26/2018. FINDINGS: There is prompt uptake in biliary excretion of activity. There is prompt drainage into the biliary system and small bowel. There is progressive accumulation of activity in the subhepatic space, corresponding with the fluid collection on recent CT and consistent with a contained bile leak. No free peritoneal activity identified. IMPRESSION: Progressive accumulation of activity in the subhepatic space consistent with a contained bile leak. No evidence of biliary obstruction. Electronically Signed   By: Carey Bullocks M.D.   On: 04/28/2018 16:04    Intubation; chest extubation on 05/02/2018 IR guided subhepatic drain placement on 04/27/2018 ERCP and CBD stent placement on 04/30/2018   Subjective: Patient seen and examined at bedside.  He is sleepy, hardly wakes up on calling his name.  Still has NG tube in place.  No overnight fever or vomiting reported.  Discharge Exam: Vitals:   05/06/18 0700 05/06/18 0806  BP:  (!) 121/55  Pulse: 84 84  Resp: (!) 33 (!) 35  Temp:   99.5 F (37.5 C)  SpO2: 99% 97%   Vitals:   05/06/18 0400 05/06/18 0500 05/06/18 0700 05/06/18 0806  BP: 128/66   (!) 121/55  Pulse: 85  84 84  Resp: (!) 33  (!) 33 (!) 35  Temp:    99.5 F (37.5 C)  TempSrc:    Axillary  SpO2: 98%  99% 97%  Weight:  76.4 kg    Height:        General: Elderly male  lying in bed.  Sleepy, hardly wakes up on calling his name.  NG tube in place.   Cardiovascular: Intermittent bradycardia, S1/S2 + Respiratory: bilateral decreased breath sounds at bases with scattered crackles Abdominal: Soft, NT, ND, bowel sounds + Extremities: no edema, no cyanosis    The results of significant diagnostics from this hospitalization (including imaging, microbiology, ancillary and laboratory) are listed below for reference.     Microbiology: Recent Results (from the past 240 hour(s))  Urine culture     Status: Abnormal   Collection Time: 04/26/18  2:48 PM  Result Value Ref Range Status   Specimen Description   Final    URINE, CLEAN CATCH Performed at [facility], [address], [place], [place] [address]    Special Requests   Final    NONE Performed at [facility], [address], [place], [place] [address]    Culture (A)  Final    <10,000 COLONIES/mL INSIGNIFICANT GROWTH Performed at [facility], [address] [place], [place] [address]    Report Status 04/27/2018 FINAL  Final  Culture, blood (routine x 2)     Status: None   Collection Time: 04/26/18  3:08 PM  Result Value Ref Range Status   Specimen Description BLOOD RIGHT ARM  Final   Special Requests   Final    BOTTLES DRAWN AEROBIC AND ANAEROBIC Blood Culture adequate volume   Culture   Final    NO GROWTH 5 DAYS Performed at [facility], [address], [place], [place] [address]    Report Status 05/01/2018 FINAL  Final  Culture, blood (routine x 2)     Status: None   Collection Time: 04/26/18  3:16 PM  Result Value Ref Range Status   Specimen Description BLOOD LEFT ARM DRAWN BY RN   Final   Special Requests   Final    BOTTLES DRAWN AEROBIC AND ANAEROBIC Blood Culture adequate volume   Culture   Final    NO GROWTH 5 DAYS Performed at [facility], [address] [place], [place] [address]    Report Status 05/01/2018 FINAL  Final  MRSA PCR Screening     Status: None   Collection Time: 04/27/18  2:13 PM  Result Value Ref Range Status   MRSA by PCR NEGATIVE NEGATIVE Final    Comment:        The GeneXpert MRSA Assay (FDA approved for NASAL specimens only), is one component of a comprehensive MRSA colonization surveillance program. It is not intended to diagnose MRSA infection nor to guide or monitor treatment for MRSA infections. Performed at [facility], [address] [place], [place] [address]   Aerobic/Anaerobic Culture (surgical/deep wound)     Status: None   Collection Time: 04/27/18  8:30 PM  Result Value Ref Range Status   Specimen Description ABSCESS  Final   Special Requests INTRA ABDOMINAL DRAINAGE  Final   Gram Stain   Final    MODERATE WBC PRESENT, PREDOMINANTLY PMN FEW GRAM NEGATIVE RODS    Culture   Final    MODERATE SERRATIA MARCESCENS FEW KLEBSIELLA PNEUMONIAE NO ANAEROBES ISOLATED Performed at [facility], [address], [place], [place] [address]    Report Status 05/02/2018 FINAL  Final   Organism ID, Bacteria SERRATIA MARCESCENS  Final   Organism ID, Bacteria KLEBSIELLA PNEUMONIAE  Final      Susceptibility   Klebsiella pneumoniae - MIC*    AMPICILLIN RESISTANT Resistant     CEFAZOLIN <=  4 SENSITIVE Sensitive     CEFEPIME <=1 SENSITIVE Sensitive     CEFTAZIDIME <=1 SENSITIVE Sensitive     CEFTRIAXONE <=1 SENSITIVE Sensitive     CIPROFLOXACIN <=0.25 SENSITIVE Sensitive     GENTAMICIN <=1 SENSITIVE Sensitive     IMIPENEM <=0.25 SENSITIVE Sensitive     TRIMETH/SULFA <=20 SENSITIVE Sensitive     AMPICILLIN/SULBACTAM 4 SENSITIVE Sensitive     PIP/TAZO <=4 SENSITIVE Sensitive     Extended ESBL NEGATIVE Sensitive      * FEW KLEBSIELLA PNEUMONIAE   Serratia marcescens - MIC*    CEFAZOLIN >=64 RESISTANT Resistant     CEFEPIME <=1 SENSITIVE Sensitive     CEFTAZIDIME <=1 SENSITIVE Sensitive     CEFTRIAXONE <=1 SENSITIVE Sensitive     CIPROFLOXACIN <=0.25 SENSITIVE Sensitive     GENTAMICIN <=1 SENSITIVE Sensitive     TRIMETH/SULFA <=20 SENSITIVE Sensitive     * MODERATE SERRATIA MARCESCENS  Surgical pcr screen     Status: None   Collection Time: 04/29/18  9:11 PM  Result Value Ref Range Status   MRSA, PCR NEGATIVE NEGATIVE Final   Staphylococcus aureus NEGATIVE NEGATIVE Final    Comment: (NOTE) The Xpert SA Assay (FDA approved for NASAL specimens in patients 83 years of age and older), is one component of a comprehensive surveillance program. It is not intended to diagnose infection nor to guide or monitor treatment. Performed at [facility], [address] [place], [place] [address]   Culture, respiratory (non-expectorated)     Status: None   Collection Time: 05/02/18 11:04 AM  Result Value Ref Range Status   Specimen Description TRACHEAL ASPIRATE  Final   Special Requests NONE  Final   Gram Stain   Final    ABUNDANT WBC PRESENT, PREDOMINANTLY PMN FEW YEAST Performed at [facility], [address] [place], [place] [address]    Culture FEW CANDIDA PARAPSILOSIS  Final   Report Status 05/04/2018 FINAL  Final     Labs: BNP (last 3 results) No results for input(s): BNP in the last 8760 hours. Basic Metabolic Panel: Recent Labs  Lab 04/30/18 0417 04/30/18 1932 05/01/18 0350 05/02/18 0347  05/03/18 0235 05/03/18 1002 05/04/18 0359 05/05/18 0437 05/06/18 0410  NA  --  144  --  143   < > 145 147* 145 146* 147*  K  --  4.5  --  3.8   < > 3.4* 3.3* 3.1* 3.3* 2.8*  CL  --  120*  --  116*   < > 113* 111 108 106 105  CO2  --  18*  --  21*   < > 24 26 27 30  34*  GLUCOSE  --  146*  --  116*   < > 97 96 134* 141* 155*  BUN  --  51*  --  49*   < > 49* 48* 45* 43* 45*   CREATININE 3.02* 2.89*  --  2.89*   < > 3.09* 3.10* 2.89* 2.98* 2.87*  CALCIUM  --  7.4*  --  7.5*   < > 8.1* 8.2* 7.8* 8.0* 7.7*  MG 2.1 2.0 2.1 1.8  --   --   --   --   --  1.4*  PHOS 2.3*  --  2.6 3.0  --   --   --   --   --   --    < > = values in this interval not displayed.   Liver Function  Tests: Recent Labs  Lab 05/01/18 0350 05/05/18 0437 05/06/18 0410  AST 36 24 25  ALT 28 17 17   ALKPHOS 72 91 82  BILITOT 0.8 0.6 0.8  PROT 5.0* 6.0* 5.6*  ALBUMIN 1.8* 2.0* 1.9*   No results for input(s): LIPASE, AMYLASE in the last 168 hours. Recent Labs  Lab 05/06/18 0410  AMMONIA 18   CBC: Recent Labs  Lab 04/30/18 0417 05/01/18 0350 05/01/18 1254 05/02/18 0347 05/04/18 0359 05/05/18 0437 05/06/18 0410  WBC 14.1* 11.9*  --  10.4 11.6* 14.2* 15.5*  NEUTROABS 11.7* 9.2*  --  7.5  --   --  11.8*  HGB 7.3* 6.8* 8.6* 8.9* 9.6* 10.2* 9.6*  HCT 23.8* 22.1* 26.9* 27.1* 29.1* 32.2* 31.1*  MCV 97.9 98.7  --  95.1 93.3 94.2 95.4  PLT 302 284  --  304 323 333 284   Cardiac Enzymes: No results for input(s): CKTOTAL, CKMB, CKMBINDEX, TROPONINI in the last 168 hours. BNP: Invalid input(s): POCBNP CBG: Recent Labs  Lab 05/03/18 0057 05/03/18 0439 05/03/18 0514 05/03/18 0803 05/03/18 1126  GLUCAP 156* 60* 94 84 99   D-Dimer No results for input(s): DDIMER in the last 72 hours. Hgb A1c No results for input(s): HGBA1C in the last 72 hours. Lipid Profile No results for input(s): CHOL, HDL, LDLCALC, TRIG, CHOLHDL, LDLDIRECT in the last 72 hours. Thyroid function studies No results for input(s): TSH, T4TOTAL, T3FREE, THYROIDAB in the last 72 hours.  Invalid input(s): FREET3 Anemia work up No results for input(s): VITAMINB12, FOLATE, FERRITIN, TIBC, IRON, RETICCTPCT in the last 72 hours. Urinalysis    Component Value Date/Time   COLORURINE YELLOW 05/04/2018 1137   APPEARANCEUR CLOUDY (A) 05/04/2018 1137   LABSPEC 1.005 05/04/2018 1137   PHURINE 6.0 05/04/2018 1137    GLUCOSEU NEGATIVE 05/04/2018 1137   HGBUR MODERATE (A) 05/04/2018 1137   BILIRUBINUR NEGATIVE 05/04/2018 1137   KETONESUR NEGATIVE 05/04/2018 1137   PROTEINUR NEGATIVE 05/04/2018 1137   NITRITE NEGATIVE 05/04/2018 1137   LEUKOCYTESUR LARGE (A) 05/04/2018 1137   Sepsis Labs Invalid input(s): PROCALCITONIN,  WBC,  LACTICIDVEN Microbiology Recent Results (from the past 240 hour(s))  Urine culture     Status: Abnormal   Collection Time: 04/26/18  2:48 PM  Result Value Ref Range Status   Specimen Description   Final    URINE, CLEAN CATCH Performed at [facility], [address], [place], [place] [address]    Special Requests   Final    NONE Performed at [facility], [address], [place], [place] [address]    Culture (A)  Final    <10,000 COLONIES/mL INSIGNIFICANT GROWTH Performed at [facility], [address], [place], [place] [address]    Report Status 04/27/2018 FINAL  Final  Culture, blood (routine x 2)     Status: None   Collection Time: 04/26/18  3:08 PM  Result Value Ref Range Status   Specimen Description BLOOD RIGHT ARM  Final   Special Requests   Final    BOTTLES DRAWN AEROBIC AND ANAEROBIC Blood Culture adequate volume   Culture   Final    NO GROWTH 5 DAYS Performed at [facility], [address] [place], [place] [address]    Report Status 05/01/2018 FINAL  Final  Culture, blood (routine x 2)     Status: None   Collection Time: 04/26/18  3:16 PM  Result Value Ref Range Status   Specimen Description BLOOD LEFT ARM DRAWN BY RN  Final  Special Requests   Final    BOTTLES DRAWN AEROBIC AND ANAEROBIC Blood Culture adequate volume   Culture   Final    NO GROWTH 5 DAYS Performed at [facility], [address] [place], [place] [address]    Report Status 05/01/2018 FINAL  Final  MRSA PCR Screening     Status: None   Collection Time: 04/27/18  2:13 PM  Result Value Ref Range Status   MRSA by PCR NEGATIVE NEGATIVE Final    Comment:        The GeneXpert  MRSA Assay (FDA approved for NASAL specimens only), is one component of a comprehensive MRSA colonization surveillance program. It is not intended to diagnose MRSA infection nor to guide or monitor treatment for MRSA infections. Performed at [facility], [address] [place], [place] [address]   Aerobic/Anaerobic Culture (surgical/deep wound)     Status: None   Collection Time: 04/27/18  8:30 PM  Result Value Ref Range Status   Specimen Description ABSCESS  Final   Special Requests INTRA ABDOMINAL DRAINAGE  Final   Gram Stain   Final    MODERATE WBC PRESENT, PREDOMINANTLY PMN FEW GRAM NEGATIVE RODS    Culture   Final    MODERATE SERRATIA MARCESCENS FEW KLEBSIELLA PNEUMONIAE NO ANAEROBES ISOLATED Performed at [facility], [address], [place], [place] [address]    Report Status 05/02/2018 FINAL  Final   Organism ID, Bacteria SERRATIA MARCESCENS  Final   Organism ID, Bacteria KLEBSIELLA PNEUMONIAE  Final      Susceptibility   Klebsiella pneumoniae - MIC*    AMPICILLIN RESISTANT Resistant     CEFAZOLIN <=4 SENSITIVE Sensitive     CEFEPIME <=1 SENSITIVE Sensitive     CEFTAZIDIME <=1 SENSITIVE Sensitive     CEFTRIAXONE <=1 SENSITIVE Sensitive     CIPROFLOXACIN <=0.25 SENSITIVE Sensitive     GENTAMICIN <=1 SENSITIVE Sensitive     IMIPENEM <=0.25 SENSITIVE Sensitive     TRIMETH/SULFA <=20 SENSITIVE Sensitive     AMPICILLIN/SULBACTAM 4 SENSITIVE Sensitive     PIP/TAZO <=4 SENSITIVE Sensitive     Extended ESBL NEGATIVE Sensitive     * FEW KLEBSIELLA PNEUMONIAE   Serratia marcescens - MIC*    CEFAZOLIN >=64 RESISTANT Resistant     CEFEPIME <=1 SENSITIVE Sensitive     CEFTAZIDIME <=1 SENSITIVE Sensitive     CEFTRIAXONE <=1 SENSITIVE Sensitive     CIPROFLOXACIN <=0.25 SENSITIVE Sensitive     GENTAMICIN <=1 SENSITIVE Sensitive     TRIMETH/SULFA <=20 SENSITIVE Sensitive     * MODERATE SERRATIA MARCESCENS  Surgical pcr screen     Status: None   Collection  Time: 04/29/18  9:11 PM  Result Value Ref Range Status   MRSA, PCR NEGATIVE NEGATIVE Final   Staphylococcus aureus NEGATIVE NEGATIVE Final    Comment: (NOTE) The Xpert SA Assay (FDA approved for NASAL specimens in patients 77 years of age and older), is one component of a comprehensive surveillance program. It is not intended to diagnose infection nor to guide or monitor treatment. Performed at [facility], [address], [place], [place] [address]   Culture, respiratory (non-expectorated)     Status: None   Collection Time: 05/02/18 11:04 AM  Result Value Ref Range Status   Specimen Description TRACHEAL ASPIRATE  Final   Special Requests NONE  Final   Gram Stain   Final    ABUNDANT WBC PRESENT, PREDOMINANTLY PMN FEW YEAST Performed at  [facility], [address] [place], [place] [address]    Culture FEW CANDIDA PARAPSILOSIS  Final   Report Status 05/04/2018 FINAL  Final     Time coordinating discharge: 35 minutes  SIGNED:   Glade LloydKshitiz Devean Skoczylas, MD  [facility] 05/06/2018, 10:34 AM

## 2018-05-06 NOTE — Progress Notes (Signed)
SLP Cancellation Note  Patient Details Name: Vincent Barrett MRN: [id number] DOB: [birth year]   Cancelled treatment:       Reason Eval/Treat Not Completed: SLP screened, no needs identified, will sign off. Pt discharging home with hospice; per RN no swallow evaluation needed at this time.   Rondel Baton, [place], CCC-SLP Speech-Language Pathologist Acute Rehabilitation Services Pager: [phone] Office: [phone]    Arlana Lindau 05/06/2018, 1:52 PM

## 2018-05-06 NOTE — Progress Notes (Signed)
IR rounding note via telephone per new regulations.  Intraabdominal abscess s/p RUQ abscess drain placement 04/27/2018 by Dr. Deanne Coffer.  If patient is ready for discharge: - Plan to follow-up in drain clinic in 7-10 days for CT abdomen/pelvis and drain injection (order in place). - Plan to flush drain Qdaily with 10 cc sterile saline. - Record output daily.  Please call IR with questions/concerns.  Waylan Boga Louk, PA-C 05/06/2018, 10:34 AM

## 2018-05-06 NOTE — Progress Notes (Signed)
Daily Progress Note   Patient Name: Vincent Barrett       Date: 05/06/2018 DOB: 07-27-1925  Age: 85 y.o. MRN#: [id number] Attending Physician: Glade Lloyd, MD Primary Care Physician: [name], MD Admit Date: 04/26/2018  Reason for Consultation/Follow-up: Establishing goals of care  Subjective: Patient resting, blinks his eyes to loud voice - does not speak or follow commands.   Length of Stay: 10  Current Medications: Scheduled Meds:  . chlorhexidine  15 mL Mouth Rinse BID  . mouth rinse  15 mL Mouth Rinse q12n4p  . pantoprazole (PROTONIX) IV  40 mg Intravenous Q24H  . sodium chloride flush  10-40 mL Intracatheter Q12H  . sodium chloride flush  5 mL Intracatheter Q8H    Continuous Infusions: . sodium chloride 10 mL/hr at 05/03/18 1100  . cefTRIAXone (ROCEPHIN)  IV 2 g (05/06/18 1250)    PRN Meds: sodium chloride, bisacodyl, docusate, ipratropium-albuterol, [DISCONTINUED] ondansetron **OR** ondansetron (ZOFRAN) IV, sodium chloride flush  Physical Exam Constitutional:      General: He is not in acute distress.    Appearance: He is ill-appearing.  HENT:     Head: Normocephalic and atraumatic.  Cardiovascular:     Rate and Rhythm: Normal rate. Rhythm irregular.  Pulmonary:     Effort: Pulmonary effort is normal.     Breath sounds: Normal breath sounds.  Abdominal:     Palpations: Abdomen is soft.  Musculoskeletal:     Right lower leg: Edema present.     Left lower leg: Edema present.  Skin:    General: Skin is warm and dry.  Neurological:     Mental Status: He is lethargic and disoriented.             Vital Signs: BP (!) 147/77   Pulse 85   Temp 99.5 F (37.5 C) (Axillary)   Resp (!) 31   Ht 5\' 6"  (1.676 m)   Wt 76.4 kg   SpO2 97%   BMI 27.19 kg/m  SpO2: SpO2: 97 %  O2 Device: O2 Device: Nasal Cannula O2 Flow Rate: O2 Flow Rate (L/min): 3 L/min  Intake/output summary:   Intake/Output Summary (Last 24 hours) at 05/06/2018 1410 Last data filed at 05/06/2018 1337 Gross per 24 hour  Intake 1992.17 ml  Output 2245 ml  Net -252.83 ml   LBM: Last BM Date: 05/06/18 Baseline Weight: Weight: 79.5 kg Most recent weight: Weight: 76.4 kg       Palliative Assessment/Data: PPS 10%    Flowsheet Rows     Most Recent Value  Intake Tab  Referral Department  Hospitalist  Unit at Time of Referral  Intermediate Care Unit  Palliative Care Primary Diagnosis  Sepsis/Infectious Disease  Date Notified  05/05/18  Palliative Care Type  New Palliative care  Reason for referral  Clarify Goals of Care  Date of Admission  04/26/18  Date first seen by Palliative Care  05/05/18  # of days Palliative referral response time  0 Day(s)  # of days IP prior to Palliative referral  9  Clinical Assessment  Palliative Performance Scale Score  30%  Psychosocial & Spiritual Assessment  Palliative Care Outcomes  Patient/Family meeting held?  Yes  Who was at the meeting?  wife  Palliative Care Outcomes  Clarified goals of care, Counseled regarding hospice, Provided end of life care assistance, Provided psychosocial or spiritual support, Changed CPR status, Transitioned to hospice      Patient Active Problem List   Diagnosis Date Noted  . Postoperative intra-abdominal abscess   . Goals of care, counseling/discussion   . Palliative care by specialist   . Pressure injury of skin 04/28/2018  . Sepsis (HCC) 04/26/2018  . Abdominal pain 04/15/2018  . Hypokalemia 04/15/2018  . CKD (chronic kidney disease) stage 3, GFR 30-59 ml/min (HCC) 04/15/2018  . Cholelithiasis with choledocholithiasis 04/15/2018  . Solitary pulmonary nodule 09/04/2017  . Upper airway cough syndrome 09/03/2017  . On anticoagulant therapy 03/25/2017  . Atrial flutter (HCC) 02/20/2017  . Leg edema, right  02/21/2016  . Constipation 02/19/2015  . Atrial fibrillation (HCC) 02/17/2013  . Essential hypertension 02/17/2013  . COPD (chronic obstructive pulmonary disease) (HCC) 02/17/2013    Palliative Care Assessment & Plan   HPI: 85 y.o. male  with past medical history of HTN, a fib on eliquis, CKD, BPH, and COPD admitted on 04/26/2018 with increased abdominal pain and bloating.  Patient has a recent admission 3/4-3/13 for acute cholecystitis and a cholecystectomy. Patient also had an ERCP w/ sphincterectomy and extraction of stone during this admission. This admission, patient found to have gallbladder fossa abscess. He required transfer to ICU and was intubated. Found to have biliary leak and required pressors d/t septic shock. Patient required IR drain placement on 3/17 for abscess. He had ERCP with CBD stent placement 3/20. He self-extubated on 3/22. Today, patient is less responsive. Unable to participate in SLP evaluation and remains on tube feeds. PMT consulted for GOC.   Assessment: Follow up with patient - remains minimally responsive. Feeding tube has been removed. Appears comfortable. Nurse reports he has been comfortable - no concerns.  Called patient's wife who tells me she has been communicating with hospice and understands patient is being discharged soon. She asks me what to do for nourishment since his feeding tube has been discontinued - we discussed the patient may take bites/sips if he is able. We discussed safe intake - patient awake and sitting upright before attempting to give food/drink. We discussed that if the patient is resting, to let him rest - not to force intake. She expressed understanding and tells me she knows his time is short. She and her daughter were able to come visit him last night. We discussed services provided by hospice. Emotional support provided. All questions and concerns addressed.  Recommendations/Plan:  Patient to d/c today home with hospice  Signed DNR  is on chart  Patient appears comfortable - discharge summary is already complete so will not add meds at this point  Full comfort care  Goals of Care and Additional Recommendations:  Limitations on Scope of Treatment: Full Comfort Care  Code Status:  DNR  Prognosis:   < 2 weeks  Discharge Planning:  Home with Hospice  Care plan was discussed with nurse, Dr. Hanley Ben, patient's wife  Thank you for allowing the Palliative Medicine Team to assist in the care of this patient.   Total Time 25 minutes Prolonged Time Billed  no       Greater than 50%  of this time was spent counseling and coordinating care related to the above assessment and plan.  Gerlean Ren, DNP, [facility] Palliative Medicine Team Team Phone # [phone]  Pager [phone]

## 2018-05-06 NOTE — Progress Notes (Signed)
6 Days Post-Op  Subjective: CC: Abdominal pain Somnolent again today but opens eyes and answers one step questions. When asked if in any pain, he denies. Denies N/V. Unsure of flatus. Tolerating TFs  Objective: Vital signs in last 24 hours: Temp:  [98.3 F (36.8 C)-99.5 F (37.5 C)] 99.5 F (37.5 C) (03/26 0806) Pulse Rate:  [84-106] 84 (03/26 0806) Resp:  [26-36] 35 (03/26 0806) BP: (121-154)/(55-80) 121/55 (03/26 0806) SpO2:  [93 %-100 %] 97 % (03/26 0806) Weight:  [76.4 kg] 76.4 kg (03/26 0500) Last BM Date: 05/03/18  Intake/Output from previous day: 03/25 0701 - 03/26 0700 In: 1539.4 [I.V.:243.7; NG/GT:1185.8; IV Piggyback:100] Out: 2855 [Urine:2800; Drains:55] Intake/Output this shift: No intake/output data recorded.  PE: Gen: NAD Heart: Tachycardic, regular rhythm  Lungs: Normal effort, rhonchi noted  Abd: Soft, mild distension, nontender. +BS. Drain in place with bilious output. GU: Scrotal edema  Lab Results:  Recent Labs    05/05/18 0437 05/06/18 0410  WBC 14.2* 15.5*  HGB 10.2* 9.6*  HCT 32.2* 31.1*  PLT 333 284   BMET Recent Labs    05/05/18 0437 05/06/18 0410  NA 146* 147*  K 3.3* 2.8*  CL 106 105  CO2 30 34*  GLUCOSE 141* 155*  BUN 43* 45*  CREATININE 2.98* 2.87*  CALCIUM 8.0* 7.7*   PT/INR No results for input(s): LABPROT, INR in the last 72 hours. CMP     Component Value Date/Time   NA 147 (H) 05/06/2018 0410   NA 138 03/17/2018 1554   K 2.8 (L) 05/06/2018 0410   CL 105 05/06/2018 0410   CO2 34 (H) 05/06/2018 0410   GLUCOSE 155 (H) 05/06/2018 0410   BUN 45 (H) 05/06/2018 0410   BUN 20 03/17/2018 1554   CREATININE 2.87 (H) 05/06/2018 0410   CALCIUM 7.7 (L) 05/06/2018 0410   PROT 5.6 (L) 05/06/2018 0410   ALBUMIN 1.9 (L) 05/06/2018 0410   AST 25 05/06/2018 0410   ALT 17 05/06/2018 0410   ALKPHOS 82 05/06/2018 0410   BILITOT 0.8 05/06/2018 0410   GFRNONAA 18 (L) 05/06/2018 0410   GFRAA 21 (L) 05/06/2018 0410   Lipase     Component Value Date/Time   LIPASE 35 04/26/2018 1508       Studies/Results: Ct Head Wo Contrast  Result Date: 05/05/2018 CLINICAL DATA:  Altered mental status. EXAM: CT HEAD WITHOUT CONTRAST TECHNIQUE: Contiguous axial images were obtained from the base of the skull through the vertex without intravenous contrast. COMPARISON:  Head CT 12/06/2016 FINDINGS: Brain: No evidence of acute infarction, hemorrhage, hydrocephalus, extra-axial collection or mass lesion/mass effect. Moderate brain parenchymal volume loss and deep [place] Br[place]ant matter microangiopathy. Vascular: Vascular calcifications. Skull: Normal. Negative for fracture or focal lesion. Sinuses/Orbits: Left mastoid air cell effusion. Polypoid mucosal thickening of the maxillary sinuses. Other: Partially visualized NG catheter in the left nasal cavity. IMPRESSION: 1. No acute intracranial abnormality. 2. Atrophy, chronic microvascular disease. 3. Left mastoid effusion. Electronically Signed   By: Ted Mcalpine M.D.   On: 05/05/2018 13:19   Dg Chest Port 1 View  Result Date: 05/05/2018 CLINICAL DATA:  Dyspnea and lethargy. EXAM: PORTABLE CHEST 1 VIEW COMPARISON:  Single-view of the chest 05/03/2018, 05/02/2018 and 05/01/2018. FINDINGS: Left IJ catheter and NG tube remain in place. There is cardiomegaly and vascular congestion. Left basilar opacity is unchanged. Right lung is clear. No pneumothorax. IMPRESSION: Cardiomegaly and vascular congestion. No change in left basilar opacity which could be atelectasis or pneumonia. Electronically Signed  By: Drusilla Kanner M.D.   On: 05/05/2018 12:47    Anti-infectives: Anti-infectives (From admission, onward)   Start     Dose/Rate Route Frequency Ordered Stop   05/02/18 1200  cefTRIAXone (ROCEPHIN) 2 g in sodium chloride 0.9 % 100 mL IVPB     2 g 200 mL/hr over 30 Minutes Intravenous Every 24 hours 05/02/18 1100     04/27/18 1800  vancomycin (VANCOCIN) IVPB 750 mg/150 ml premix  Status:   Discontinued     750 mg 150 mL/hr over 60 Minutes Intravenous Every 24 hours 04/26/18 1634 04/27/18 0730   04/27/18 1600  ceFEPIme (MAXIPIME) 2 g in sodium chloride 0.9 % 100 mL IVPB  Status:  Discontinued     2 g 200 mL/hr over 30 Minutes Intravenous Every 24 hours 04/26/18 1629 04/27/18 1412   04/27/18 1600  ceFEPIme (MAXIPIME) 1 g in sodium chloride 0.9 % 100 mL IVPB  Status:  Discontinued     1 g 200 mL/hr over 30 Minutes Intravenous Every 24 hours 04/27/18 1412 05/02/18 1100   04/26/18 2359  metroNIDAZOLE (FLAGYL) IVPB 500 mg  Status:  Discontinued     500 mg 100 mL/hr over 60 Minutes Intravenous Every 8 hours 04/26/18 2325 05/02/18 1058   04/26/18 1700  vancomycin (VANCOCIN) 1,500 mg in sodium chloride 0.9 % 500 mL IVPB     1,500 mg 250 mL/hr over 120 Minutes Intravenous  Once 04/26/18 1557 04/26/18 2059   04/26/18 1600  ceFEPIme (MAXIPIME) 2 g in sodium chloride 0.9 % 100 mL IVPB     2 g 200 mL/hr over 30 Minutes Intravenous  Once 04/26/18 1553 04/26/18 1650   04/26/18 1600  metroNIDAZOLE (FLAGYL) IVPB 500 mg     500 mg 100 mL/hr over 60 Minutes Intravenous  Once 04/26/18 1553 04/26/18 1830   04/26/18 1600  vancomycin (VANCOCIN) IVPB 1000 mg/200 mL premix  Status:  Discontinued     1,000 mg 200 mL/hr over 60 Minutes Intravenous  Once 04/26/18 1553 04/26/18 1557       Assessment/Plan Acute respiratory failure 2/2 aspiration PNA AKI Paroxysmal A. Fib Anemia- Hgb stable and rising, 10.2 on 3/25 Scrotal edema. UA c/w with UTI - on rocephin already. Per medicine   Status post laparoscopic cholecystectomy 04/16/2018 by Dr. Corliss Skains Gallbladder fossa abscess - S/p ERCP with CBD stent on 3/20 - Cx with serratia and klebsiella  - Transition to po abx - Continue drain - IV ABX,drain inserted with bilious output. ERCP/stent 3/20; growing serratia and klebsiella   FEN- Trickle TFs, increase as tolerated, speech to see patient for swallow study.  ID - Vancomycin 3/16 >> 3/16,  Cefepime 3/16 >> 3/21, Flagyl 3/16 >> 3/21. Rocephin 3/22 >>WBC 14.2, afebrile VTE - SCDs, okay for chemical prophylaxis from a surgical standpoint  Noted palliative care consultation and GOC discussion underway; we remain available to support/discuss as well   LOS: 10 days    Stephanie Coup [facility] Surgery 05/06/2018, 8:14 AM Pager: [phone]

## 2018-05-06 NOTE — Care Management Important Message (Signed)
Important Message  Patient Details  Name: Vincent Barrett MRN: [id number] Date of Birth: 10/28/1925   Medicare Important Message Given:  Yes    Dorena Bodo 05/06/2018, 3:34 PM

## 2018-05-10 LAB — CULTURE, BLOOD (ROUTINE X 2)
Culture: NO GROWTH
Culture: NO GROWTH
Special Requests: ADEQUATE
Special Requests: ADEQUATE

## 2018-06-11 ENCOUNTER — Encounter (HOSPITAL_COMMUNITY): Payer: Self-pay | Admitting: Gastroenterology

## 2018-06-11 DEATH — deceased

## 2018-06-23 ENCOUNTER — Ambulatory Visit: Payer: Medicare Other | Admitting: Internal Medicine

## 2019-11-19 IMAGING — CT CT HEAD WITHOUT CONTRAST
4 series · 16 of 47 positions shown, 18 images · non-contrast
Comparison: Head CT 12/06/2016

CLINICAL DATA: Altered mental status.

EXAM:
CT HEAD WITHOUT CONTRAST
TECHNIQUE: Contiguous axial images were obtained from the base of the skull
through the vertex without intravenous contrast.

[Series 3: head without · axial · non-contrast · 0.43mm/px · z∈[-104,+21]mm · 7 of 35 slices shown, 9 images]
[im 5/35  brain]
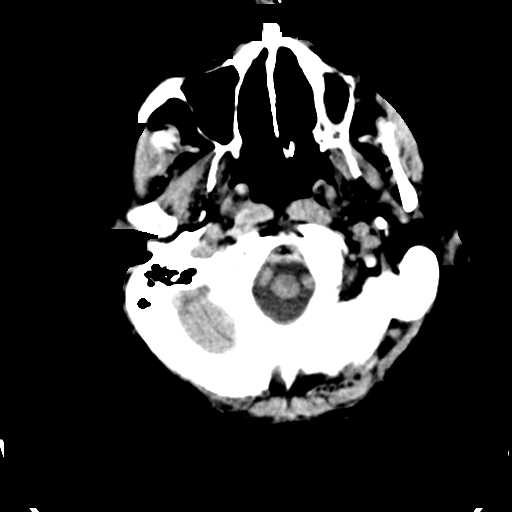
[im 5/35  bone]
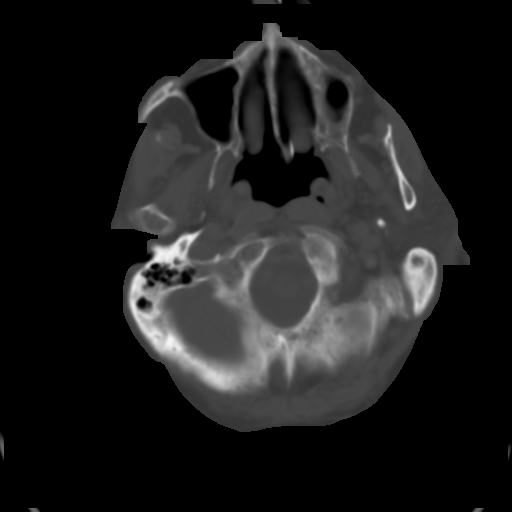
[im 9/35  brain]
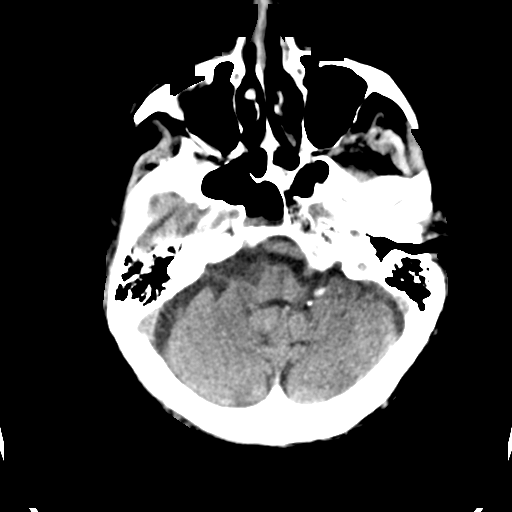
[im 13/35  brain]
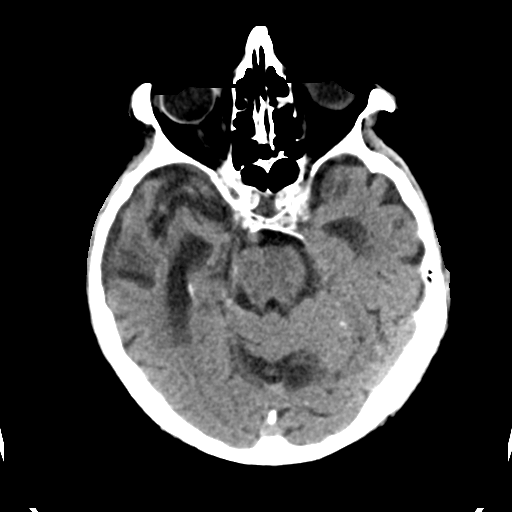
[im 18/35  brain]
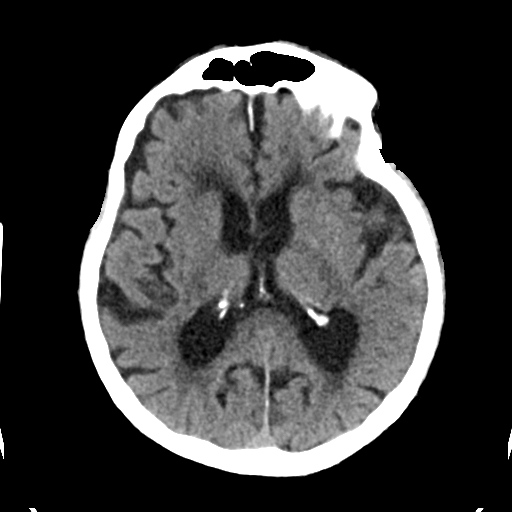
[im 22/35  brain]
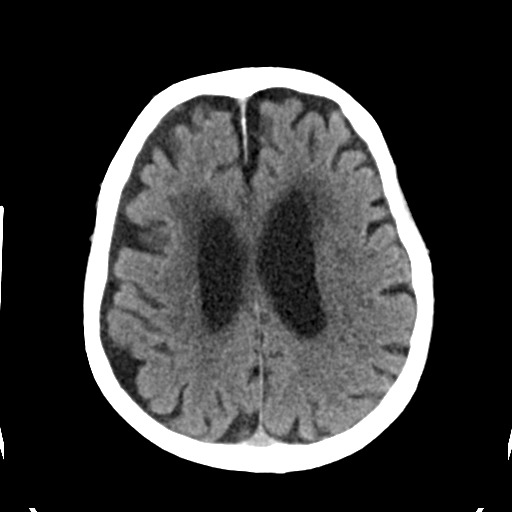
[im 22/35  bone]
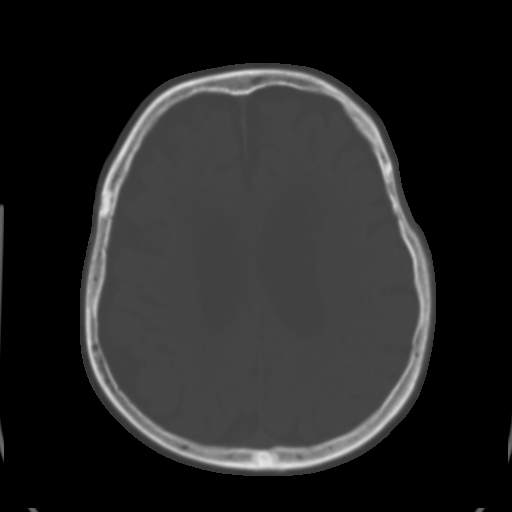
[im 26/35  brain]
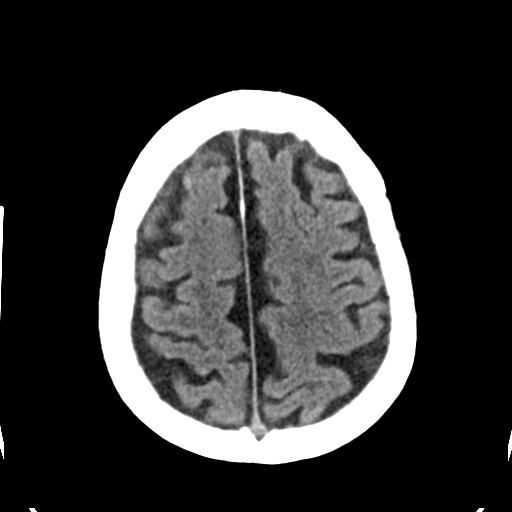
[im 30/35  brain]
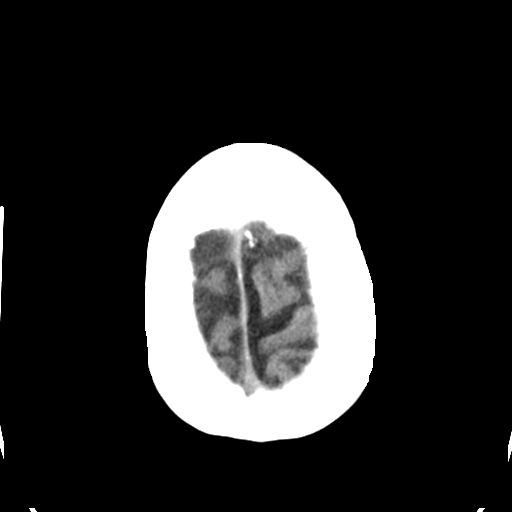

[Series 4: head bone · axial · 0.43mm/px · z∈[-108,-74]mm · 3 of 86 slices shown]
[im 9/86  bone]
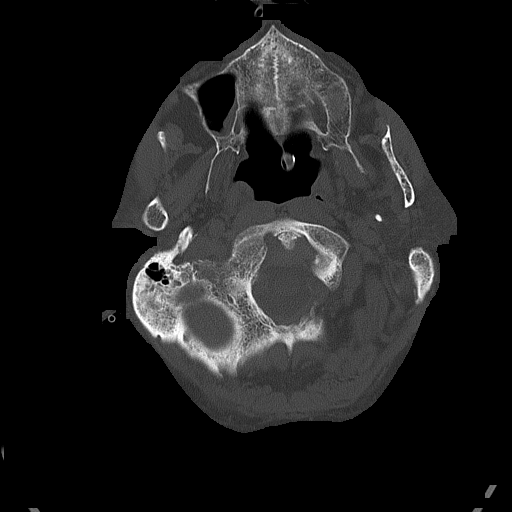
[im 18/86  bone]
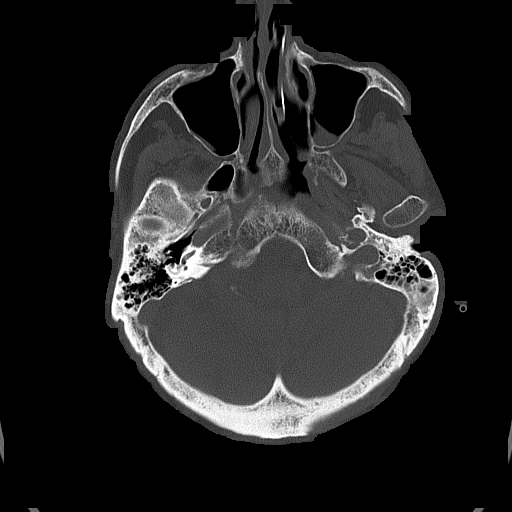
[im 26/86  bone]
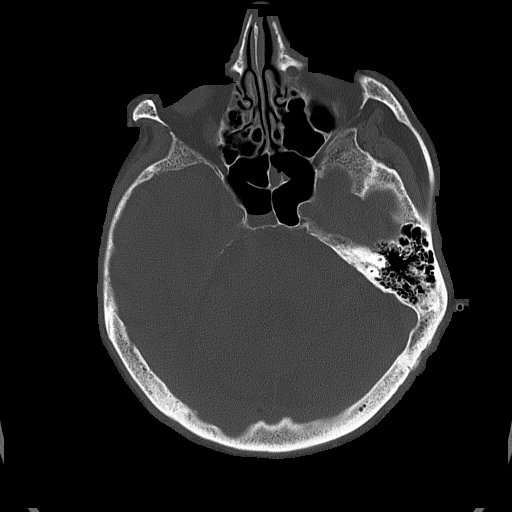

[Series 5: head without cor · coronal · non-contrast · 0.33mm/px · 3 of 69 slices shown]
[im 23/69  brain]
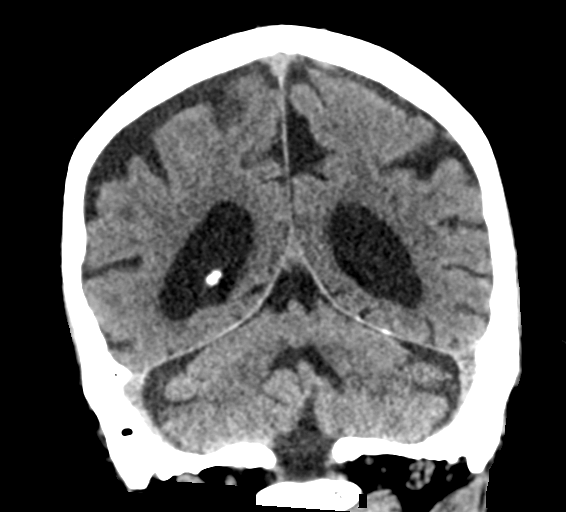
[im 31/69  brain]
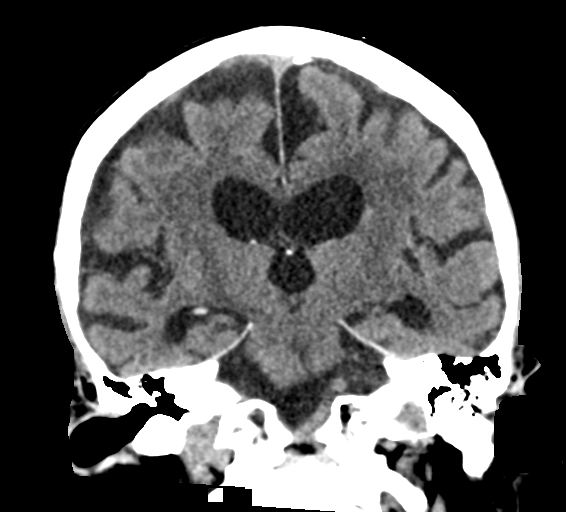
[im 38/69  brain]
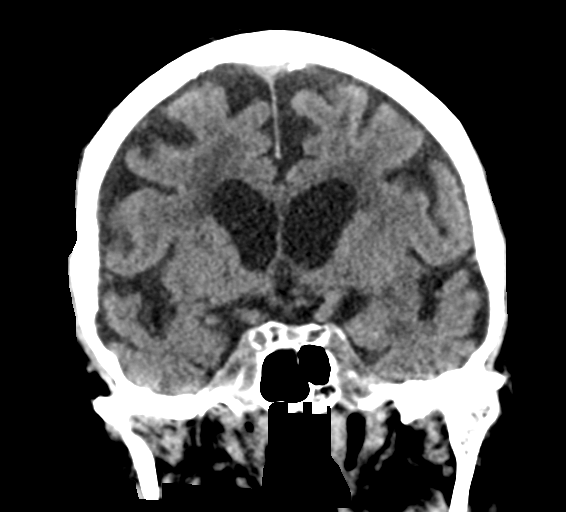

[Series 6: head without sag · sagittal · non-contrast · 0.33mm/px · 3 of 61 slices shown]
[im 21/61  brain]
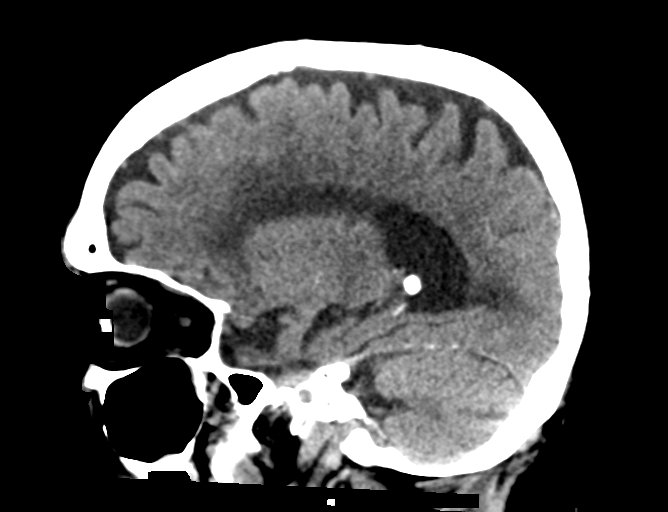
[im 31/61  brain]
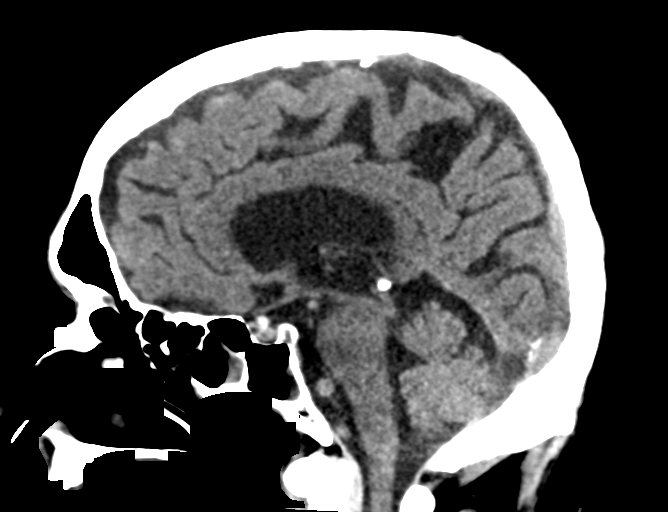
[im 41/61  brain]
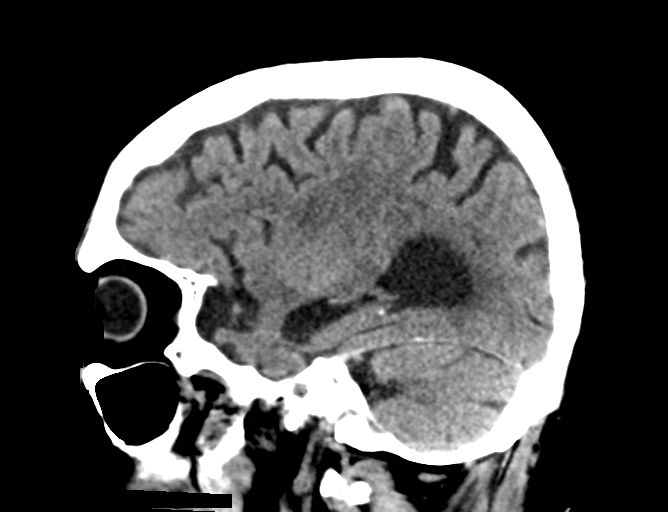

[16 of 47 positions shown; findings below may reference images not displayed]

FINDINGS: Brain: No evidence of acute infarction, hemorrhage, hydrocephalus,
extra-axial collection or mass lesion/mass effect. Moderate brain
parenchymal volume loss and deep white matter microangiopathy.

Vascular: Vascular calcifications.

Skull: Normal. Negative for fracture or focal lesion.

Sinuses/Orbits: Left mastoid air cell effusion. Polypoid mucosal
thickening of the maxillary sinuses.

Other: Partially visualized NG catheter in the left nasal cavity.
IMPRESSION: 1. No acute intracranial abnormality.
2. Atrophy, chronic microvascular disease.
3. Left mastoid effusion.
